# Patient Record
Sex: Female | Born: 1945
Health system: Southern US, Community
[De-identification: ages and names within clinical notes are randomized; demographics above are authoritative.]

## PROBLEM LIST (undated history)

## (undated) DIAGNOSIS — R7303 Prediabetes: Secondary | ICD-10-CM

## (undated) DIAGNOSIS — R7302 Impaired glucose tolerance (oral): Secondary | ICD-10-CM

## (undated) DIAGNOSIS — K5730 Diverticulosis of large intestine without perforation or abscess without bleeding: Secondary | ICD-10-CM

## (undated) DIAGNOSIS — K635 Polyp of colon: Secondary | ICD-10-CM

## (undated) DIAGNOSIS — K648 Other hemorrhoids: Secondary | ICD-10-CM

## (undated) DIAGNOSIS — J449 Chronic obstructive pulmonary disease, unspecified: Secondary | ICD-10-CM

## (undated) DIAGNOSIS — I10 Essential (primary) hypertension: Secondary | ICD-10-CM

## (undated) DIAGNOSIS — H269 Unspecified cataract: Secondary | ICD-10-CM

## (undated) DIAGNOSIS — J189 Pneumonia, unspecified organism: Secondary | ICD-10-CM

## (undated) HISTORY — DX: Essential (primary) hypertension: I10

## (undated) HISTORY — DX: Polyp of colon: K63.5

## (undated) HISTORY — DX: Other hemorrhoids: K64.8

## (undated) HISTORY — PX: CATARACT EXTRACTION W/ INTRAOCULAR LENS  IMPLANT, BILATERAL: SHX1307

## (undated) HISTORY — DX: Chronic obstructive pulmonary disease, unspecified: J44.9

## (undated) HISTORY — DX: Impaired glucose tolerance (oral): R73.02

## (undated) HISTORY — DX: Diverticulosis of large intestine without perforation or abscess without bleeding: K57.30

## (undated) HISTORY — DX: Unspecified cataract: H26.9

## (undated) HISTORY — PX: TUBAL LIGATION: SHX77

## (undated) HISTORY — PX: BREAST BIOPSY: SHX20

---

## 1998-01-15 ENCOUNTER — Other Ambulatory Visit: Admission: RE | Admit: 1998-01-15 | Discharge: 1998-01-15 | Payer: Self-pay | Admitting: Obstetrics and Gynecology

## 1998-01-24 ENCOUNTER — Ambulatory Visit (HOSPITAL_COMMUNITY): Admission: RE | Admit: 1998-01-24 | Discharge: 1998-01-24 | Payer: Self-pay | Admitting: Obstetrics and Gynecology

## 2000-01-14 ENCOUNTER — Ambulatory Visit (HOSPITAL_COMMUNITY): Admission: RE | Admit: 2000-01-14 | Discharge: 2000-01-14 | Payer: Self-pay | Admitting: Obstetrics and Gynecology

## 2000-01-14 ENCOUNTER — Encounter: Payer: Self-pay | Admitting: Obstetrics and Gynecology

## 2004-08-25 HISTORY — PX: SPINE SURGERY: SHX786

## 2005-08-05 ENCOUNTER — Emergency Department (HOSPITAL_COMMUNITY): Admission: EM | Admit: 2005-08-05 | Discharge: 2005-08-05 | Payer: Self-pay | Admitting: Emergency Medicine

## 2007-01-28 ENCOUNTER — Ambulatory Visit (HOSPITAL_COMMUNITY): Admission: RE | Admit: 2007-01-28 | Discharge: 2007-01-28 | Payer: Self-pay | Admitting: Emergency Medicine

## 2007-02-09 DIAGNOSIS — K5730 Diverticulosis of large intestine without perforation or abscess without bleeding: Secondary | ICD-10-CM

## 2007-02-09 DIAGNOSIS — K635 Polyp of colon: Secondary | ICD-10-CM

## 2007-02-09 DIAGNOSIS — K648 Other hemorrhoids: Secondary | ICD-10-CM

## 2007-02-09 HISTORY — DX: Other hemorrhoids: K64.8

## 2007-02-09 HISTORY — DX: Polyp of colon: K63.5

## 2007-02-09 HISTORY — DX: Diverticulosis of large intestine without perforation or abscess without bleeding: K57.30

## 2007-06-22 ENCOUNTER — Ambulatory Visit (HOSPITAL_COMMUNITY): Admission: RE | Admit: 2007-06-22 | Discharge: 2007-06-22 | Payer: Self-pay | Admitting: Neurology

## 2008-02-01 ENCOUNTER — Ambulatory Visit (HOSPITAL_COMMUNITY): Admission: RE | Admit: 2008-02-01 | Discharge: 2008-02-01 | Payer: Self-pay | Admitting: Emergency Medicine

## 2011-10-06 ENCOUNTER — Ambulatory Visit: Payer: Self-pay | Admitting: Physician Assistant

## 2011-10-06 VITALS — BP 133/86 | HR 108 | Temp 99.3°F | Resp 16 | Ht 65.0 in | Wt 193.0 lb

## 2011-10-06 DIAGNOSIS — I10 Essential (primary) hypertension: Secondary | ICD-10-CM

## 2011-10-06 MED ORDER — HYDROCHLOROTHIAZIDE 25 MG PO TABS: 25.0000 mg | ORAL_TABLET | Freq: Every day | ORAL | Status: DC

## 2011-10-06 NOTE — Progress Notes (Signed)
  Subjective:    Patient ID: Nicole Spence, female    DOB: Nov 01, 1945, 66 y.o.   MRN: [id number]  HPI Nicole Spence is here for refill on HCTZ.  She has been out of her meds for 6-7 months and comes in today because she had a HA on Sunday.  HA is now gone but has pt concerned.  Has been healthy otherwise without any new medical problems.  She is not working and stays busy making jewelry at home.  Nicole Spence continues to smoke electric cigarettes.   Review of Systems  Eyes: Negative for visual disturbance.  Respiratory: Negative for cough and shortness of breath.   Cardiovascular: Positive for leg swelling. Negative for chest pain and palpitations.  Neurological: Positive for headaches. Negative for dizziness.       Objective:   Physical Exam  Constitutional: She appears well-developed and well-nourished.  Eyes: Conjunctivae, EOM and lids are normal.  Cardiovascular: Regular rhythm and normal pulses.  Tachycardia present.   Pulmonary/Chest: Effort normal and breath sounds normal.  Skin: Skin is warm. She is not diaphoretic.   EKG read with Dr. Shearon Stalls pt depression       Assessment & Plan:  HTN  CMET HCTZ 25 1 a day #90 1 rf Check readings at home. Would like for Ms Spence to return in 6 months but realize this is a financial hardship for her.  Asked that she call in 4-6 months with her status and determine any F/U at that time.

## 2011-10-07 LAB — COMPREHENSIVE METABOLIC PANEL
AST: 17 U/L (ref 0–37)
Albumin: 4.4 g/dL (ref 3.5–5.2)
BUN: 10 mg/dL (ref 6–23)
CO2: 27 mEq/L (ref 19–32)
Calcium: 9.4 mg/dL (ref 8.4–10.5)
Chloride: 103 mEq/L (ref 96–112)
Creat: 0.97 mg/dL (ref 0.50–1.10)
Glucose, Bld: 111 mg/dL — ABNORMAL HIGH (ref 70–99)
Potassium: 3.7 mEq/L (ref 3.5–5.3)

## 2011-10-07 LAB — CBC WITH DIFFERENTIAL/PLATELET
Basophils Relative: 0 % (ref 0–1)
Hemoglobin: 15.7 g/dL — ABNORMAL HIGH (ref 12.0–15.0)
Lymphocytes Relative: 32 % (ref 12–46)
MCHC: 33.5 g/dL (ref 30.0–36.0)
Monocytes Relative: 5 % (ref 3–12)
Neutro Abs: 8.9 10*3/uL — ABNORMAL HIGH (ref 1.7–7.7)
Neutrophils Relative %: 62 % (ref 43–77)
RBC: 5.26 MIL/uL — ABNORMAL HIGH (ref 3.87–5.11)
WBC: 14.5 10*3/uL — ABNORMAL HIGH (ref 4.0–10.5)

## 2012-04-01 ENCOUNTER — Other Ambulatory Visit: Payer: Self-pay | Admitting: Physician Assistant

## 2012-08-25 HISTORY — PX: BREAST BIOPSY: SHX20

## 2013-02-09 ENCOUNTER — Ambulatory Visit (INDEPENDENT_AMBULATORY_CARE_PROVIDER_SITE_OTHER): Payer: BC Managed Care – PPO | Admitting: Family Medicine

## 2013-02-09 VITALS — BP 132/76 | HR 91 | Temp 98.1°F | Resp 18 | Ht 65.0 in | Wt 196.8 lb

## 2013-02-09 DIAGNOSIS — Z1239 Encounter for other screening for malignant neoplasm of breast: Secondary | ICD-10-CM

## 2013-02-09 DIAGNOSIS — I10 Essential (primary) hypertension: Secondary | ICD-10-CM

## 2013-02-09 LAB — COMPREHENSIVE METABOLIC PANEL
BUN: 7 mg/dL (ref 6–23)
CO2: 26 mEq/L (ref 19–32)
Calcium: 9.8 mg/dL (ref 8.4–10.5)
Chloride: 106 mEq/L (ref 96–112)
Creat: 0.78 mg/dL (ref 0.50–1.10)

## 2013-02-09 LAB — POCT CBC
HCT, POC: 45 % (ref 37.7–47.9)
Hemoglobin: 14.3 g/dL (ref 12.2–16.2)
Lymph, poc: 2.9 (ref 0.6–3.4)
MCH, POC: 29.9 pg (ref 27–31.2)
MCHC: 31.8 g/dL (ref 31.8–35.4)
MPV: 8.7 fL (ref 0–99.8)
POC LYMPH PERCENT: 33.4 %L (ref 10–50)
POC MID %: 7.5 %M (ref 0–12)
WBC: 8.8 10*3/uL (ref 4.6–10.2)

## 2013-02-09 LAB — POCT URINALYSIS DIPSTICK
Bilirubin, UA: NEGATIVE
Blood, UA: NEGATIVE
Ketones, UA: NEGATIVE
pH, UA: 5.5

## 2013-02-09 LAB — TSH: TSH: 1.062 u[IU]/mL (ref 0.350–4.500)

## 2013-02-09 LAB — LIPID PANEL
HDL: 46 mg/dL (ref >39)
Triglycerides: 134 mg/dL (ref <150)

## 2013-02-09 MED ORDER — HYDROCHLOROTHIAZIDE 12.5 MG PO TABS: 12.5000 mg | ORAL_TABLET | Freq: Every day | ORAL | Status: DC

## 2013-02-09 NOTE — Progress Notes (Signed)
[address]   [place], [place]  [address]   [phone]  Subjective:    Patient ID: Nicole Spence, female    DOB: 07-28-1946, 67 y.o.   MRN: [id number]  HPI This 67 y.o. female presents for fifteen month follow-up for hypertension.  Last office visit 09/2011.  Taking medication qod.  Has not been taking medication in past week.  Denies HA, dizziness, focal weakness; denies chest pain, SOB, cough; +intermittent leg swelling.    2. Health Maintenance:  Last mammogram five years ago; due for colonoscopy.    Review of Systems  Constitutional: Negative for fever, chills, diaphoresis and fatigue.  Respiratory: Negative for cough and shortness of breath.   Cardiovascular: Positive for leg swelling. Negative for chest pain and palpitations.  Gastrointestinal: Negative for nausea, vomiting, abdominal pain and diarrhea.  Neurological: Negative for dizziness, tremors, seizures, syncope, facial asymmetry, speech difficulty, weakness, light-headedness, numbness and headaches.    Past Medical History  Diagnosis Date  . Hypertension     Past Surgical History  Procedure Laterality Date  . Tubal ligation      Prior to Admission medications   Medication Sig Start Date End Date Taking? Authorizing Provider  fish oil-omega-3 fatty acids 1000 MG capsule Take 2 g by mouth daily.   Yes Historical Provider, MD  hydrochlorothiazide (HYDRODIURIL) 25 MG tablet Take 1 tablet (25 mg total) by mouth daily. NEEDS OFFICE VISIT/LABS FOR MORE 04/01/12  Yes Pattricia Boss, PA-C    No Known Allergies  History   Social History  . Marital Status: Legally Separated    Spouse Name: N/A    Number of Children: 2  . Years of Education: N/A   Occupational History  . retired    Social History Main Topics  . Smoking status: Current Every Day Smoker  . Smokeless tobacco: Not on file  . Alcohol Use: Yes  . Drug Use: No  . Sexually Active: Not on file   Other Topics Concern  . Not on file   Social  History Narrative   Marital status: divorced.      Children: 2 children; 5 grandchildren      Employment:  Retired in 2008       Tobacco: electronic cigarette since 2011       Alcohol: weekends; wine coolers       Exercise: none    Family History  Problem Relation Age of Onset  . Diabetes Mother   . Stroke Father   . Heart disease Father        Objective:   Physical Exam  Nursing note and vitals reviewed. Constitutional: She is oriented to person, place, and time. She appears well-developed and well-nourished. No distress.  HENT:  Head: Normocephalic and atraumatic.  Right Ear: External ear normal.  Left Ear: External ear normal.  Nose: Nose normal.  Mouth/Throat: Oropharynx is clear and moist.  Eyes: Conjunctivae and EOM are normal. Pupils are equal, round, and reactive to light.  Neck: Normal range of motion. Neck supple. No thyromegaly present.  Cardiovascular: Normal rate, regular rhythm, normal heart sounds and intact distal pulses.  Exam reveals no gallop and no friction rub.   No murmur heard. Pulmonary/Chest: Effort normal and breath sounds normal. She has no wheezes.  Abdominal: Soft. Bowel sounds are normal. There is no tenderness. There is no rebound and no guarding.  Lymphadenopathy:    She has no cervical adenopathy.  Neurological: She is alert and oriented to person, place, and  time. No cranial nerve deficit. She exhibits normal muscle tone. Coordination normal.  Skin: Skin is warm and dry. She is not diaphoretic.  Psychiatric: She has a normal mood and affect. Her behavior is normal.    Results for orders placed in visit on 02/09/13  POCT CBC      Result Value Range   WBC 8.8  4.6 - 10.2 K/uL   Lymph, poc 2.9  0.6 - 3.4   POC LYMPH PERCENT 33.4  10 - 50 %L   MID (cbc) 0.7  0 - 0.9   POC MID % 7.5  0 - 12 %M   POC Granulocyte 5.2  2 - 6.9   Granulocyte percent 59.1  37 - 80 %G   RBC 4.78  4.04 - 5.48 M/uL   Hemoglobin 14.3  12.2 - 16.2 g/dL   HCT, POC  16.1  09.6 - 47.9 %   MCV 94.2  80 - 97 fL   MCH, POC 29.9  27 - 31.2 pg   MCHC 31.8  31.8 - 35.4 g/dL   RDW, POC 04.5     Platelet Count, POC 323  142 - 424 K/uL   MPV 8.7  0 - 99.8 fL  POCT URINALYSIS DIPSTICK      Result Value Range   Color, UA yellow     Clarity, UA clear     Glucose, UA neg     Bilirubin, UA neg     Ketones, UA neg     Spec Grav, UA <=1.005     Blood, UA neg     pH, UA 5.5     Protein, UA neg     Urobilinogen, UA 0.2     Nitrite, UA neg     Leukocytes, UA Negative           Assessment & Plan:  Essential hypertension, benign - Plan: POCT CBC, POCT urinalysis dipstick, Comprehensive metabolic panel, TSH, Lipid panel, hydrochlorothiazide (HYDRODIURIL) 12.5 MG tablet  Breast cancer screening - Plan: MM Digital Screening   1.  HTN: controlled; will decrease HCTZ to 12.5mg  daily and encourage daily use.  Obtain labs, urine.  Recommend checking BP daily for two weeks and then once weekly; advise follow-up in 6-7 months. 2. Breast cancer screening: refer for mammogram; follow-up for CPE in seven months.  To also call for repeat colonoscopy.  Meds ordered this encounter  Medications  . hydrochlorothiazide (HYDRODIURIL) 12.5 MG tablet    Sig: Take 1 tablet (12.5 mg total) by mouth daily. NEEDS OFFICE VISIT/LABS FOR MORE    Dispense:  90 tablet    Refill:  1

## 2013-02-11 NOTE — Progress Notes (Signed)
Left msg for pt to schedule CPE with Dr. Katrinka Blazing in January.

## 2013-02-15 NOTE — Progress Notes (Signed)
Sent pt reminder letter to schedule future CPE with Dr. Katrinka Blazing.

## 2013-02-24 ENCOUNTER — Ambulatory Visit (HOSPITAL_COMMUNITY)
Admission: RE | Admit: 2013-02-24 | Discharge: 2013-02-24 | Disposition: A | Discharge status: Home / Self Care | Payer: BC Managed Care – PPO | Source: Ambulatory Visit | Attending: Family Medicine | Admitting: Family Medicine

## 2013-02-24 DIAGNOSIS — Z1239 Encounter for other screening for malignant neoplasm of breast: Secondary | ICD-10-CM

## 2013-02-24 DIAGNOSIS — Z1231 Encounter for screening mammogram for malignant neoplasm of breast: Secondary | ICD-10-CM | POA: Insufficient documentation

## 2013-03-02 ENCOUNTER — Other Ambulatory Visit: Payer: Self-pay | Admitting: Family Medicine

## 2013-03-02 DIAGNOSIS — R928 Other abnormal and inconclusive findings on diagnostic imaging of breast: Secondary | ICD-10-CM

## 2013-03-11 ENCOUNTER — Ambulatory Visit
Admission: RE | Admit: 2013-03-11 | Discharge: 2013-03-11 | Disposition: A | Discharge status: Home / Self Care | Payer: BC Managed Care – PPO | Source: Ambulatory Visit | Attending: Family Medicine | Admitting: Family Medicine

## 2013-03-11 ENCOUNTER — Other Ambulatory Visit: Payer: Self-pay | Admitting: Family Medicine

## 2013-03-11 DIAGNOSIS — R928 Other abnormal and inconclusive findings on diagnostic imaging of breast: Secondary | ICD-10-CM

## 2013-03-29 ENCOUNTER — Ambulatory Visit
Admission: RE | Admit: 2013-03-29 | Discharge: 2013-03-29 | Disposition: A | Discharge status: Home / Self Care | Payer: BC Managed Care – PPO | Source: Ambulatory Visit | Attending: Family Medicine | Admitting: Family Medicine

## 2013-03-29 DIAGNOSIS — R928 Other abnormal and inconclusive findings on diagnostic imaging of breast: Secondary | ICD-10-CM

## 2013-08-07 ENCOUNTER — Other Ambulatory Visit: Payer: Self-pay | Admitting: Family Medicine

## 2014-01-11 ENCOUNTER — Telehealth: Payer: Self-pay

## 2014-01-11 NOTE — Telephone Encounter (Signed)
[place] WITH YOU ON June 3.  THE PT WILL RUN OUT OF BP MEDICINE BY THEN.  CAN WE PLEASE REFILL THIS?  SHE NOW USES [facility] ON [address].  [phone]

## 2014-01-12 MED ORDER — HYDROCHLOROTHIAZIDE 12.5 MG PO TABS: 12.5000 mg | ORAL_TABLET | Freq: Every day | ORAL | Status: DC

## 2014-01-12 NOTE — Telephone Encounter (Signed)
Sent in 1 mos RF and notified pt.

## 2014-01-25 ENCOUNTER — Encounter: Payer: Self-pay | Admitting: Family Medicine

## 2014-01-25 ENCOUNTER — Ambulatory Visit (INDEPENDENT_AMBULATORY_CARE_PROVIDER_SITE_OTHER): Payer: Medicare Other | Admitting: Family Medicine

## 2014-01-25 VITALS — BP 150/78 | HR 90 | Temp 99.0°F | Resp 16 | Ht 65.5 in | Wt 195.4 lb

## 2014-01-25 DIAGNOSIS — Z0000 Encounter for general adult medical examination without abnormal findings: Secondary | ICD-10-CM

## 2014-01-25 DIAGNOSIS — I10 Essential (primary) hypertension: Secondary | ICD-10-CM | POA: Diagnosis not present

## 2014-01-25 DIAGNOSIS — Z86010 Personal history of colonic polyps: Secondary | ICD-10-CM | POA: Diagnosis not present

## 2014-01-25 DIAGNOSIS — Z23 Encounter for immunization: Secondary | ICD-10-CM | POA: Diagnosis not present

## 2014-01-25 DIAGNOSIS — Z01419 Encounter for gynecological examination (general) (routine) without abnormal findings: Secondary | ICD-10-CM | POA: Diagnosis not present

## 2014-01-25 DIAGNOSIS — R7309 Other abnormal glucose: Secondary | ICD-10-CM | POA: Diagnosis not present

## 2014-01-25 DIAGNOSIS — Z1239 Encounter for other screening for malignant neoplasm of breast: Secondary | ICD-10-CM

## 2014-01-25 DIAGNOSIS — Z131 Encounter for screening for diabetes mellitus: Secondary | ICD-10-CM

## 2014-01-25 DIAGNOSIS — Z1211 Encounter for screening for malignant neoplasm of colon: Secondary | ICD-10-CM | POA: Diagnosis not present

## 2014-01-25 DIAGNOSIS — E669 Obesity, unspecified: Secondary | ICD-10-CM

## 2014-01-25 DIAGNOSIS — Z13820 Encounter for screening for osteoporosis: Secondary | ICD-10-CM

## 2014-01-25 DIAGNOSIS — Z124 Encounter for screening for malignant neoplasm of cervix: Secondary | ICD-10-CM

## 2014-01-25 LAB — CBC WITH DIFFERENTIAL/PLATELET
BASOS PCT: 0 % (ref 0–1)
Basophils Absolute: 0 10*3/uL (ref 0.0–0.1)
EOS ABS: 0.2 10*3/uL (ref 0.0–0.7)
Eosinophils Relative: 2 % (ref 0–5)
HCT: 43.9 % (ref 36.0–46.0)
HEMOGLOBIN: 15.1 g/dL — AB (ref 12.0–15.0)
LYMPHS ABS: 2.8 10*3/uL (ref 0.7–4.0)
Lymphocytes Relative: 37 % (ref 12–46)
MCH: 29.7 pg (ref 26.0–34.0)
MCHC: 34.4 g/dL (ref 30.0–36.0)
MCV: 86.2 fL (ref 78.0–100.0)
MONOS PCT: 5 % (ref 3–12)
Monocytes Absolute: 0.4 10*3/uL (ref 0.1–1.0)
NEUTROS ABS: 4.3 10*3/uL (ref 1.7–7.7)
NEUTROS PCT: 56 % (ref 43–77)
PLATELETS: 327 10*3/uL (ref 150–400)
RBC: 5.09 MIL/uL (ref 3.87–5.11)
RDW: 14 % (ref 11.5–15.5)
WBC: 7.6 10*3/uL (ref 4.0–10.5)

## 2014-01-25 LAB — POCT URINALYSIS DIPSTICK
Bilirubin, UA: NEGATIVE
Glucose, UA: NEGATIVE
Ketones, UA: NEGATIVE
NITRITE UA: NEGATIVE
PROTEIN UA: NEGATIVE
Spec Grav, UA: 1.02
UROBILINOGEN UA: 0.2
pH, UA: 5.5

## 2014-01-25 LAB — POCT UA - MICROSCOPIC ONLY
CASTS, UR, LPF, POC: NEGATIVE
CRYSTALS, UR, HPF, POC: NEGATIVE
Mucus, UA: POSITIVE
YEAST UA: NEGATIVE

## 2014-01-25 MED ORDER — HYDROCHLOROTHIAZIDE 25 MG PO TABS: 25.0000 mg | ORAL_TABLET | Freq: Every day | ORAL | Status: DC

## 2014-01-25 MED ORDER — ZOSTER VACCINE LIVE 19400 UNT/0.65ML ~~LOC~~ SOLR
0.6500 mL | Freq: Once | SUBCUTANEOUS | Status: DC
Start: 2014-01-25 — End: 2014-07-26

## 2014-01-25 MED ORDER — PNEUMOCOCCAL 13-VAL CONJ VACC IM SUSP: 0.5000 mL | Freq: Once | INTRAMUSCULAR | Status: DC
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
## 2014-01-25 NOTE — Patient Instructions (Addendum)
1. Start Aspirin 81mg  one tablet daily. 2.  Recommend 3 servings of dairy daily. 3.  Recommend quitting e-cigarette. 4.  Recommend exercise for 30 minutes five days per week.

## 2014-01-26 ENCOUNTER — Telehealth: Payer: Self-pay

## 2014-01-26 LAB — PAP IG W/ RFLX HPV ASCU

## 2014-01-26 LAB — COMPLETE METABOLIC PANEL WITH GFR
ALBUMIN: 4.2 g/dL (ref 3.5–5.2)
ALK PHOS: 100 U/L (ref 39–117)
ALT: 13 U/L (ref 0–35)
AST: 18 U/L (ref 0–37)
BILIRUBIN TOTAL: 0.6 mg/dL (ref 0.2–1.2)
BUN: 9 mg/dL (ref 6–23)
CO2: 31 meq/L (ref 19–32)
Calcium: 10.3 mg/dL (ref 8.4–10.5)
Chloride: 101 mEq/L (ref 96–112)
Creat: 0.84 mg/dL (ref 0.50–1.10)
GFR, Est African American: 84 mL/min
GFR, Est Non African American: 73 mL/min
Glucose, Bld: 97 mg/dL (ref 70–99)
POTASSIUM: 4 meq/L (ref 3.5–5.3)
SODIUM: 141 meq/L (ref 135–145)
TOTAL PROTEIN: 7.5 g/dL (ref 6.0–8.3)

## 2014-01-26 LAB — LIPID PANEL
CHOL/HDL RATIO: 3.1 ratio
Cholesterol: 144 mg/dL (ref 0–200)
HDL: 46 mg/dL (ref >39)
LDL CALC: 80 mg/dL (ref 0–99)
Triglycerides: 89 mg/dL (ref <150)
VLDL: 18 mg/dL (ref 0–40)

## 2014-01-26 LAB — HEMOGLOBIN A1C
HEMOGLOBIN A1C: 6.4 % — AB (ref <5.7)
MEAN PLASMA GLUCOSE: 137 mg/dL — AB (ref <117)

## 2014-01-26 NOTE — Telephone Encounter (Signed)
Pt saw Dr. Tamala Julian and was written a prescription for an injection, but it needs pre-authorization for medicare at [phone]: Also the name on the prescription had her wrong middle initil ( it was an S, should be an N) she doesn't know if that will be a problem or not

## 2014-01-30 NOTE — Telephone Encounter (Signed)
Called pt for details and she stated that the pharm told her that ins should cover it ans she can just go in and have it done at certain times. Pt will try this and will have pharm send fax if PA is needed after all.

## 2014-02-06 DIAGNOSIS — Z86010 Personal history of colonic polyps: Secondary | ICD-10-CM | POA: Diagnosis not present

## 2014-02-06 DIAGNOSIS — R143 Flatulence: Secondary | ICD-10-CM | POA: Diagnosis not present

## 2014-02-06 DIAGNOSIS — K219 Gastro-esophageal reflux disease without esophagitis: Secondary | ICD-10-CM | POA: Diagnosis not present

## 2014-02-06 DIAGNOSIS — R141 Gas pain: Secondary | ICD-10-CM | POA: Diagnosis not present

## 2014-02-21 DIAGNOSIS — Z86010 Personal history of colonic polyps: Secondary | ICD-10-CM | POA: Diagnosis not present

## 2014-02-21 DIAGNOSIS — K5730 Diverticulosis of large intestine without perforation or abscess without bleeding: Secondary | ICD-10-CM | POA: Diagnosis not present

## 2014-02-21 DIAGNOSIS — D175 Benign lipomatous neoplasm of intra-abdominal organs: Secondary | ICD-10-CM | POA: Diagnosis not present

## 2014-02-21 LAB — HM COLONOSCOPY

## 2014-04-22 ENCOUNTER — Ambulatory Visit (INDEPENDENT_AMBULATORY_CARE_PROVIDER_SITE_OTHER): Payer: Medicare Other | Admitting: Family Medicine

## 2014-04-22 VITALS — BP 130/74 | HR 75 | Temp 98.2°F | Resp 18 | Ht 65.5 in | Wt 195.2 lb

## 2014-04-22 DIAGNOSIS — S60229A Contusion of unspecified hand, initial encounter: Secondary | ICD-10-CM | POA: Diagnosis not present

## 2014-04-22 DIAGNOSIS — M79642 Pain in left hand: Secondary | ICD-10-CM

## 2014-04-22 DIAGNOSIS — S60222A Contusion of left hand, initial encounter: Secondary | ICD-10-CM

## 2014-04-22 DIAGNOSIS — M79609 Pain in unspecified limb: Secondary | ICD-10-CM | POA: Diagnosis not present

## 2014-04-22 NOTE — Patient Instructions (Signed)
RICE: Routine Care for Injuries The routine care of many injuries includes Rest, Ice, Compression, and Elevation (RICE). HOME CARE INSTRUCTIONS  Rest is needed to allow your body to heal. Routine activities can usually be resumed when comfortable. Injured tendons and bones can take up to 6 weeks to heal. Tendons are the cord-like structures that attach muscle to bone.  Ice following an injury helps keep the swelling down and reduces pain.  Put ice in a plastic bag.  Place a towel between your skin and the bag.  Leave the ice on for 15-20 minutes, 3-4 times a day, or as directed by your health care provider. Do this while awake, for the first 24 to 48 hours. After that, continue as directed by your caregiver.  Compression helps keep swelling down. It also gives support and helps with discomfort. If an elastic bandage has been applied, it should be removed and reapplied every 3 to 4 hours. It should not be applied tightly, but firmly enough to keep swelling down. Watch fingers or toes for swelling, bluish discoloration, coldness, numbness, or excessive pain. If any of these problems occur, remove the bandage and reapply loosely. Contact your caregiver if these problems continue.  Elevation helps reduce swelling and decreases pain. With extremities, such as the arms, hands, legs, and feet, the injured area should be placed near or above the level of the heart, if possible. SEEK IMMEDIATE MEDICAL CARE IF:  You have persistent pain and swelling.  You develop redness, numbness, or unexpected weakness.  Your symptoms are getting worse rather than improving after several days. These symptoms may indicate that further evaluation or further X-rays are needed. Sometimes, X-rays may not show a small broken bone (fracture) until 1 week or 10 days later. Make a follow-up appointment with your caregiver. Ask when your X-ray results will be ready. Make sure you get your X-ray results. Document Released:  11/23/2000 Document Revised: 08/16/2013 Document Reviewed: 01/10/2011 ExitCare Patient Information 2015 [employer]. This information is not intended to replace advice given to you by your health care provider. Make sure you discuss any questions you have with your health care provider.  
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
## 2014-05-04 ENCOUNTER — Ambulatory Visit (INDEPENDENT_AMBULATORY_CARE_PROVIDER_SITE_OTHER): Payer: Medicare Other | Admitting: Family Medicine

## 2014-05-04 ENCOUNTER — Ambulatory Visit (INDEPENDENT_AMBULATORY_CARE_PROVIDER_SITE_OTHER): Payer: Medicare Other

## 2014-05-04 VITALS — BP 130/78 | HR 86 | Temp 98.9°F | Resp 18 | Ht 65.0 in | Wt 194.0 lb

## 2014-05-04 DIAGNOSIS — S2341XA Sprain of ribs, initial encounter: Secondary | ICD-10-CM | POA: Diagnosis not present

## 2014-05-04 DIAGNOSIS — Z23 Encounter for immunization: Secondary | ICD-10-CM

## 2014-05-04 DIAGNOSIS — R0781 Pleurodynia: Secondary | ICD-10-CM

## 2014-05-04 DIAGNOSIS — R079 Chest pain, unspecified: Secondary | ICD-10-CM | POA: Diagnosis not present

## 2014-05-04 DIAGNOSIS — R0902 Hypoxemia: Secondary | ICD-10-CM | POA: Diagnosis not present

## 2014-05-04 DIAGNOSIS — S29019A Strain of muscle and tendon of unspecified wall of thorax, initial encounter: Secondary | ICD-10-CM

## 2014-05-04 DIAGNOSIS — R739 Hyperglycemia, unspecified: Secondary | ICD-10-CM

## 2014-05-04 DIAGNOSIS — R7309 Other abnormal glucose: Secondary | ICD-10-CM

## 2014-05-04 DIAGNOSIS — R0602 Shortness of breath: Secondary | ICD-10-CM

## 2014-05-04 DIAGNOSIS — J449 Chronic obstructive pulmonary disease, unspecified: Secondary | ICD-10-CM

## 2014-05-04 LAB — POCT CBC
GRANULOCYTE PERCENT: 59.4 % (ref 37–80)
HEMATOCRIT: 45.2 % (ref 37.7–47.9)
Hemoglobin: 14.7 g/dL (ref 12.2–16.2)
LYMPH, POC: 4.1 — AB (ref 0.6–3.4)
MCH, POC: 30.2 pg (ref 27–31.2)
MCHC: 32.6 g/dL (ref 31.8–35.4)
MCV: 92.8 fL (ref 80–97)
MID (CBC): 0.2 (ref 0–0.9)
MPV: 7 fL (ref 0–99.8)
PLATELET COUNT, POC: 315 10*3/uL (ref 142–424)
POC Granulocyte: 6.3 (ref 2–6.9)
POC LYMPH %: 38.7 % (ref 10–50)
POC MID %: 1.9 %M (ref 0–12)
RBC: 4.87 M/uL (ref 4.04–5.48)
RDW, POC: 14.7 %
WBC: 10.6 10*3/uL — AB (ref 4.6–10.2)

## 2014-05-04 LAB — GLUCOSE, POCT (MANUAL RESULT ENTRY): POC Glucose: 93 mg/dl (ref 70–99)

## 2014-05-04 MED ORDER — METHOCARBAMOL 500 MG PO TABS: 500.0000 mg | ORAL_TABLET | Freq: Every evening | ORAL | Status: DC | PRN

## 2014-05-04 MED ORDER — MELOXICAM 15 MG PO TABS: 15.0000 mg | ORAL_TABLET | Freq: Every day | ORAL | Status: DC

## 2014-05-04 MED ORDER — TIOTROPIUM BROMIDE MONOHYDRATE 18 MCG IN CAPS: 18.0000 ug | ORAL_CAPSULE | Freq: Every day | RESPIRATORY_TRACT | Status: DC
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
## 2014-05-04 NOTE — Patient Instructions (Signed)
Influenza Vaccine (Flu Vaccine, Inactivated or Recombinant) 2014-2015: What You Need to Know 1. Why get vaccinated? Influenza ("flu") is a contagious disease that spreads around the [place] every winter, usually between October and May. Flu is caused by influenza viruses, and is spread mainly by coughing, sneezing, and close contact. Anyone can get flu, but the risk of getting flu is highest among children. Symptoms come on suddenly and may last several days. They can include:  fever/chills  sore throat  muscle aches  fatigue  cough  headache  runny or stuffy nose Flu can make some people much sicker than others. These people include young children, people 65 and older, pregnant women, and people with certain health conditions-such as heart, lung or kidney disease, nervous system disorders, or a weakened immune system. Flu vaccination is especially important for these people, and anyone in close contact with them. Flu can also lead to pneumonia, and make existing medical conditions worse. It can cause diarrhea and seizures in children. Each year thousands of people in the [place] die from flu, and many more are hospitalized. Flu vaccine is the best protection against flu and its complications. Flu vaccine also helps prevent spreading flu from person to person. 2. Inactivated and recombinant flu vaccines You are getting an injectable flu vaccine, which is either an "inactivated" or "recombinant" vaccine. These vaccines do not contain any live influenza virus. They are given by injection with a needle, and often called the "flu shot."  A different live, attenuated (weakened) influenza vaccine is sprayed into the nostrils. This vaccine is described in a separate Vaccine Information Statement. Flu vaccination is recommended every year. Some children 6 months through 8 years of age might need two doses during one year. Flu viruses are always changing. Each year's flu vaccine is made  to protect against 3 or 4 viruses that are likely to cause disease that year. Flu vaccine cannot prevent all cases of flu, but it is the best defense against the disease.  It takes about 2 weeks for protection to develop after the vaccination, and protection lasts several months to a year. Some illnesses that are not caused by influenza virus are often mistaken for flu. Flu vaccine will not prevent these illnesses. It can only prevent influenza. Some inactivated flu vaccine contains a very small amount of a mercury-based preservative called thimerosal. Studies have shown that thimerosal in vaccines is not harmful, but flu vaccines that do not contain a preservative are available. 3. Some people should not get this vaccine Tell the person who gives you the vaccine:  If you have any severe, life-threatening allergies. If you ever had a life-threatening allergic reaction after a dose of flu vaccine, or have a severe allergy to any part of this vaccine, including (for example) an allergy to gelatin, antibiotics, or eggs, you may be advised not to get vaccinated. Most, but not all, types of flu vaccine contain a small amount of egg protein.  If you ever had Guillain-Barr Syndrome (a severe paralyzing illness, also called GBS). Some people with a history of GBS should not get this vaccine. This should be discussed with your doctor.  If you are not feeling well. It is usually okay to get flu vaccine when you have a mild illness, but you might be advised to wait until you feel better. You should come back when you are better. 4. Risks of a vaccine reaction With a vaccine, like any medicine, there is a chance of side   effects. These are usually mild and go away on their own. Problems that could happen after any vaccine:  Brief fainting spells can happen after any medical procedure, including vaccination. Sitting or lying down for about 15 minutes can help prevent fainting, and injuries caused by a fall. Tell  your doctor if you feel dizzy, or have vision changes or ringing in the ears.  Severe shoulder pain and reduced range of motion in the arm where a shot was given can happen, very rarely, after a vaccination.  Severe allergic reactions from a vaccine are very rare, estimated at less than 1 in a million doses. If one were to occur, it would usually be within a few minutes to a few hours after the vaccination. Mild problems following inactivated flu vaccine:  soreness, redness, or swelling where the shot was given  hoarseness  sore, red or itchy eyes  cough  fever  aches  headache  itching  fatigue If these problems occur, they usually begin soon after the shot and last 1 or 2 days. Moderate problems following inactivated flu vaccine:  Young children who get inactivated flu vaccine and pneumococcal vaccine (PCV13) at the same time may be at increased risk for seizures caused by fever. Ask your doctor for more information. Tell your doctor if a child who is getting flu vaccine has ever had a seizure. Inactivated flu vaccine does not contain live flu virus, so you cannot get the flu from this vaccine. As with any medicine, there is a very remote chance of a vaccine causing a serious injury or death. The safety of vaccines is always being monitored. For more information, visit: www.cdc.gov/vaccinesafety/ 5. What if there is a serious reaction? What should I look for?  Look for anything that concerns you, such as signs of a severe allergic reaction, very high fever, or behavior changes. Signs of a severe allergic reaction can include hives, swelling of the face and throat, difficulty breathing, a fast heartbeat, dizziness, and weakness. These would start a few minutes to a few hours after the vaccination. What should I do?  If you think it is a severe allergic reaction or other emergency that can't wait, call 9-1-1 and get the person to the nearest hospital. Otherwise, call your  doctor.  Afterward, the reaction should be reported to the Vaccine Adverse Event Reporting System (VAERS). Your doctor should file this report, or you can do it yourself through the VAERS web site at www.vaers.hhs.gov, or by calling 1-800-822-7967. VAERS does not give medical advice. 6. The National Vaccine Injury Compensation Program The National Vaccine Injury Compensation Program (VICP) is a federal program that was created to compensate people who may have been injured by certain vaccines. Persons who believe they may have been injured by a vaccine can learn about the program and about filing a claim by calling 1-800-338-2382 or visiting the VICP website at www.hrsa.gov/vaccinecompensation. There is a time limit to file a claim for compensation. 7. How can I learn more?  Ask your health care provider.  Call your local or state health department.  Contact the Centers for Disease Control and Prevention (CDC):  Call 1-800-232-4636 (1-800-CDC-INFO) or  Visit CDC's website at www.cdc.gov/flu CDC Vaccine Information Statement (Interim) Inactivated Influenza Vaccine (04/12/2013) Document Released: 06/05/2006 Document Revised: 12/26/2013 Document Reviewed: 07/29/2013 Exit[employer] Patient Information 2015 [employer]. This information is not intended to replace advice given to you by your health care provider. Make sure you discuss any questions you have with your health   care provider.  

## 2014-07-26 ENCOUNTER — Telehealth: Payer: Self-pay | Admitting: Family Medicine

## 2014-07-26 ENCOUNTER — Ambulatory Visit (INDEPENDENT_AMBULATORY_CARE_PROVIDER_SITE_OTHER): Payer: Medicare Other | Admitting: Family Medicine

## 2014-07-26 ENCOUNTER — Encounter: Payer: Self-pay | Admitting: Family Medicine

## 2014-07-26 ENCOUNTER — Ambulatory Visit (INDEPENDENT_AMBULATORY_CARE_PROVIDER_SITE_OTHER): Payer: Medicare Other

## 2014-07-26 VITALS — BP 122/72 | HR 87 | Temp 98.4°F | Resp 16 | Ht 65.0 in | Wt 197.6 lb

## 2014-07-26 DIAGNOSIS — R739 Hyperglycemia, unspecified: Secondary | ICD-10-CM | POA: Diagnosis not present

## 2014-07-26 DIAGNOSIS — R0600 Dyspnea, unspecified: Secondary | ICD-10-CM

## 2014-07-26 DIAGNOSIS — M791 Myalgia, unspecified site: Secondary | ICD-10-CM

## 2014-07-26 DIAGNOSIS — I10 Essential (primary) hypertension: Secondary | ICD-10-CM | POA: Diagnosis not present

## 2014-07-26 DIAGNOSIS — J449 Chronic obstructive pulmonary disease, unspecified: Secondary | ICD-10-CM

## 2014-07-26 DIAGNOSIS — R0781 Pleurodynia: Secondary | ICD-10-CM | POA: Diagnosis not present

## 2014-07-26 DIAGNOSIS — E2839 Other primary ovarian failure: Secondary | ICD-10-CM | POA: Diagnosis not present

## 2014-07-26 DIAGNOSIS — J309 Allergic rhinitis, unspecified: Secondary | ICD-10-CM | POA: Diagnosis not present

## 2014-07-26 DIAGNOSIS — R7309 Other abnormal glucose: Secondary | ICD-10-CM | POA: Diagnosis not present

## 2014-07-26 LAB — COMPLETE METABOLIC PANEL WITH GFR
ALT: 14 U/L (ref 0–35)
AST: 19 U/L (ref 0–37)
Albumin: 4.1 g/dL (ref 3.5–5.2)
Alkaline Phosphatase: 95 U/L (ref 39–117)
BUN: 13 mg/dL (ref 6–23)
CO2: 27 mEq/L (ref 19–32)
Calcium: 10.1 mg/dL (ref 8.4–10.5)
Chloride: 102 mEq/L (ref 96–112)
Creat: 1.04 mg/dL (ref 0.50–1.10)
GFR, Est African American: 65 mL/min
GFR, Est Non African American: 56 mL/min — ABNORMAL LOW
Glucose, Bld: 82 mg/dL (ref 70–99)
Potassium: 3.9 mEq/L (ref 3.5–5.3)
Sodium: 138 mEq/L (ref 135–145)
Total Bilirubin: 0.5 mg/dL (ref 0.2–1.2)
Total Protein: 7.5 g/dL (ref 6.0–8.3)

## 2014-07-26 LAB — HEMOGLOBIN A1C
HEMOGLOBIN A1C: 6.2 % — AB (ref <5.7)
MEAN PLASMA GLUCOSE: 131 mg/dL — AB (ref <117)

## 2014-07-26 LAB — GLUCOSE, POCT (MANUAL RESULT ENTRY): POC Glucose: 91 mg/dl (ref 70–99)

## 2014-07-26 MED ORDER — CETIRIZINE HCL 5 MG PO TABS: 5.0000 mg | ORAL_TABLET | Freq: Every day | ORAL | Status: DC

## 2014-07-26 NOTE — Telephone Encounter (Signed)
Called patient, unfortunately she does not meet qualifications for handicapped parking, Dr Tamala Julian would like her to walk for exercise.

## 2014-07-26 NOTE — Telephone Encounter (Signed)
Had appt today and forgot to ask about getting a handicapped sticker.   [phone]
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
## 2014-07-26 NOTE — Progress Notes (Signed)
Subjective:    Patient ID: Nicole Spence, female    DOB: 30-Jul-1946, 68 y.o.   MRN: [id number]  HPI  This is a 68 year old female with PMH HTN and COPD presenting for 6 month follow up.  HTN: Last visit HCTZ was increased to 25 mg d/t borderline control. Doesn't check BP at home.  COPD: diagnosed 3 months ago d/t abnormal spirometry. She was prescribed spiriva. She notes when she first started the spiriva she was having a severe sore throat and stopped the medication. She restarted daily spiriva 1 month ago. She is still getting a sore throat in the mornings but less severe. She feels the inhaler has not helped her breathing. She gets SOB with walking to her mailbox. She states she does not exercise. She has discontinued use of e-cigarettes. She is now using 4 mg nicotine lozenges - she uses 4/day. She is planning to go down to 2 mg nicotine lozenges today.  Myalgias: She is still having occasional left rib pain. 3 months ago she was prescribed mobic and robaxin. She finished the mobic. Her pain is much improved from then. She has leftover robaxin and she occasionally will take one before bed if she is having pain - takes a robaxin once every 2 weeks. She usually has pain after an active day around the house.  Got zostavax 05/2014 She got her colonoscopy and self-reports it was normal Normal. She is unsure what her follow up is. Mammogram is due She needs a referral for her bone density scan.  Review of Systems Patient Active Problem List   Diagnosis Date Noted  . Essential hypertension 07/26/2014  . Chronic obstructive pulmonary disease (COPD) 07/26/2014   Prior to Admission medications   Medication Sig Start Date End Date Taking? Authorizing Provider  hydrochlorothiazide (HYDRODIURIL) 25 MG tablet Take 1 tablet (25 mg total) by mouth daily. 01/25/14  Yes Wardell Honour, MD  methocarbamol (ROBAXIN) 500 MG tablet Take 1 tablet (500 mg total) by mouth at bedtime as needed for muscle  spasms. 05/04/14  Yes Wardell Honour, MD  Multiple Vitamins-Minerals (WOMENS 50+ [place] VITAMIN/MIN) TABS Take by mouth daily.   Yes Historical Provider, MD  Probiotic Product (PROBIOTIC DAILY PO) Take by mouth.   Yes Historical Provider, MD  tiotropium (SPIRIVA) 18 MCG inhalation capsule Place 1 capsule (18 mcg total) into inhaler and inhale daily. 05/04/14  Yes Wardell Honour, MD          Allergies  Allergen Reactions  . Adhesive [Tape] Other (See Comments)    IRRITATES SKIN    Patient's social and family history were reviewed.     Objective:   Physical Exam  Constitutional: She is oriented to person, place, and time. She appears well-developed and well-nourished. No distress.  HENT:  Head: Normocephalic and atraumatic.  Right Ear: Hearing, external ear and ear canal normal. Tympanic membrane is retracted.  Left Ear: External ear normal. Tympanic membrane is retracted. Decreased hearing is noted.  Mouth/Throat: Uvula is midline, oropharynx is clear and moist and mucous membranes are normal.  Areas of pale mucosa  Eyes: Conjunctivae and lids are normal. Right eye exhibits no discharge. Left eye exhibits no discharge. No scleral icterus.  Cardiovascular: Normal rate, regular rhythm, normal heart sounds, intact distal pulses and normal pulses.   No murmur heard. Pulmonary/Chest: Effort normal and breath sounds normal. No respiratory distress. She has no wheezes. She has no rhonchi. She has no rales.  Abdominal: Soft. Normal  appearance. There is tenderness (over left anterior ribs, see depiction).    Musculoskeletal: Normal range of motion.  Neurological: She is alert and oriented to person, place, and time.  Skin: Skin is warm, dry and intact. No lesion and no rash noted.  Psychiatric: She has a normal mood and affect. Her speech is normal and behavior is normal. Thought content normal.   BP 122/72 mmHg  Pulse 87  Temp(Src) 98.4 F (36.9 C) (Oral)  Resp 16  Ht 5\' 5"  (1.651 m)  Wt  197 lb 9.6 oz (89.631 kg)  BMI 32.88 kg/m2  SpO2 97%   Results for orders placed or performed in visit on 07/26/14  POCT glucose (manual entry)  Result Value Ref Range   POC Glucose 91 70 - 99 mg/dl      Assessment & Plan:  1. Chronic obstructive pulmonary disease, unspecified COPD, unspecified chronic bronchitis type 2. Dyspnea EKG normal. Spirometry normal today. Will send for stress test to rule out cardiac cause of SOB. If normal, SOB may be d/t deconditioning. Pt will continue spiriva daily.  - Pulmonary Function Test - DG Chest 2 View; Future - EKG 12-Lead  3. Hyperglycemia Glucose 91 today, hgb A1C pending. - POCT glucose (manual entry) - Hemoglobin A1c  4. Essential hypertension, benign Stable. Continue HCTZ 25 mg. - COMPLETE METABOLIC PANEL WITH GFR  5. Allergic rhinitis, unspecified allergic rhinitis type - cetirizine (ZYRTEC) 5 MG tablet; Take 1 tablet (5 mg total) by mouth daily.  Dispense: 30 tablet; Refill: 11  6. Myalgia 7. Rib pain on left side Most likely MSK cause. Per above, referred for stress test to rule out cardiac cause.  8. Estrogen deficiency - DG Bone Density; Future   Benjaman Pott. Drenda Freeze, MHS [facility]  07/26/2014

## 2014-08-01 NOTE — Progress Notes (Signed)
Spoke to pt, she is aware of results

## 2014-08-08 ENCOUNTER — Telehealth (HOSPITAL_COMMUNITY): Payer: Self-pay | Admitting: *Deleted

## 2014-08-11 ENCOUNTER — Encounter (HOSPITAL_COMMUNITY): Payer: Self-pay | Admitting: *Deleted

## 2014-08-31 ENCOUNTER — Telehealth (HOSPITAL_COMMUNITY): Payer: Self-pay

## 2014-08-31 NOTE — Telephone Encounter (Signed)
Encounter complete. 

## 2014-09-05 ENCOUNTER — Ambulatory Visit (HOSPITAL_COMMUNITY)
Admission: RE | Admit: 2014-09-05 | Discharge: 2014-09-05 | Disposition: A | Discharge status: Home / Self Care | Payer: Medicare Other | Source: Ambulatory Visit | Attending: Family Medicine | Admitting: Family Medicine

## 2014-09-05 DIAGNOSIS — I10 Essential (primary) hypertension: Secondary | ICD-10-CM | POA: Diagnosis not present

## 2014-09-05 DIAGNOSIS — R42 Dizziness and giddiness: Secondary | ICD-10-CM | POA: Insufficient documentation

## 2014-09-05 DIAGNOSIS — R0609 Other forms of dyspnea: Secondary | ICD-10-CM | POA: Insufficient documentation

## 2014-09-05 DIAGNOSIS — Z8249 Family history of ischemic heart disease and other diseases of the circulatory system: Secondary | ICD-10-CM | POA: Insufficient documentation

## 2014-09-05 DIAGNOSIS — Z87891 Personal history of nicotine dependence: Secondary | ICD-10-CM | POA: Diagnosis not present

## 2014-09-05 DIAGNOSIS — E785 Hyperlipidemia, unspecified: Secondary | ICD-10-CM | POA: Insufficient documentation

## 2014-09-05 DIAGNOSIS — E669 Obesity, unspecified: Secondary | ICD-10-CM | POA: Insufficient documentation

## 2014-09-05 DIAGNOSIS — R0600 Dyspnea, unspecified: Secondary | ICD-10-CM | POA: Diagnosis not present

## 2014-09-05 DIAGNOSIS — J449 Chronic obstructive pulmonary disease, unspecified: Secondary | ICD-10-CM | POA: Diagnosis not present

## 2014-09-05 DIAGNOSIS — R5383 Other fatigue: Secondary | ICD-10-CM | POA: Diagnosis not present

## 2014-09-05 DIAGNOSIS — R0781 Pleurodynia: Secondary | ICD-10-CM | POA: Diagnosis not present

## 2014-09-05 DIAGNOSIS — R079 Chest pain, unspecified: Secondary | ICD-10-CM | POA: Insufficient documentation

## 2014-09-05 MED ORDER — TECHNETIUM TC 99M SESTAMIBI GENERIC - CARDIOLITE
31.1000 | Freq: Once | INTRAVENOUS | Status: AC | PRN
  Administered 2014-09-05: 31.1 via INTRAVENOUS

## 2014-09-05 MED ORDER — TECHNETIUM TC 99M SESTAMIBI GENERIC - CARDIOLITE
10.8000 | Freq: Once | INTRAVENOUS | Status: AC | PRN
  Administered 2014-09-05: 11 via INTRAVENOUS

## 2014-09-05 NOTE — Procedures (Addendum)
[facility] [address] [place] [address] [phone]  Cardiology Nuclear Med Study  Nicole Spence is a 69 y.o. female     MRN : [id number]     DOB: [birth year]  Procedure Date: 09/05/2014  Nuclear Med Background Indication for Stress Test:  Evaluation for Ischemia History:  COPD and No prior cardiac history reported;No prior NUC MPI for comparison. Cardiac Risk Factors: Family History - CAD, History of Smoking, Hypertension, Lipids and Overweight  Symptoms:  Chest Pain, DOE, Fatigue and Light-Headedness   Nuclear Pre-Procedure Caffeine/Decaff Intake:  7:00pm NPO After: 5:00am   IV Site: R Forearm  IV 0.9% NS with Angio Cath:  22g  Chest Size (in):  n/a IV Started by: Rolene Course, RN  Height: 5\' 5"  (1.651 m)  Cup Size: DD  BMI:  Body mass index is 32.78 kg/(m^2). Weight:  197 lb (89.359 kg)   Tech Comments:  n/a    Nuclear Med Study 1 or 2 day study: 1 day  Stress Test Type:  Stress  Order Authorizing Provider:  Reginia Forts, MD   Resting Radionuclide: Technetium 77m Sestamibi  Resting Radionuclide Dose: 10.8 mCi   Stress Radionuclide:  Technetium 78m Sestamibi  Stress Radionuclide Dose: 31.1 mCi           Stress Protocol Rest HR: 90 Stress HR: 155  Rest BP: 142/80 Stress BP:211/110  Exercise Time (min): 4:41 METS: 5.80   Predicted Max HR: 154 bpm % Max HR: 100.65 bpm Rate Pressure Product: 50539  Dose of Adenosine (mg):  n/a Dose of Lexiscan: n/a mg  Dose of Atropine (mg): n/a Dose of Dobutamine: n/a mcg/kg/min (at max HR)  Stress Test Technologist: Mellody Memos, CCT Nuclear Technologist: Imagene Riches, CNMT   Rest Procedure:  Myocardial perfusion imaging was performed at rest 45 minutes following the intravenous administration of Technetium 62m Sestamibi. Stress Procedure:  The patient performed treadmill exercise using a Bruce  Protocol for 4 minutes 41 seconds. The patient stopped due to shortness  of breath and elevated blood pressure. Patient denied any chest pain.  There were no significant ST-T wave changes.  Technetium 35m Sestamibi was injected IV at peak exercise and myocardial perfusion imaging was performed after a brief delay.  Transient Ischemic Dilatation (Normal <1.22): 0.90  QGS EDV:  43 ml QGS ESV:  8 ml LV Ejection Fraction: 81%     Rest ECG: NSR - Normal EKG  Stress ECG: Insignificant upsloping ST segment depression.  QPS Raw Data Images:  Normal; no motion artifact; normal heart/lung ratio. Stress Images:  Normal homogeneous uptake in all areas of the myocardium. Rest Images:  Normal homogeneous uptake in all areas of the myocardium. Subtraction (SDS):  Normal  Impression Exercise Capacity:  Fair exercise capacity. BP Response:  Hypertensive blood pressure response with peak BP 211/110. Clinical Symptoms:  Mild shortness of breath ECG Impression:  Insignificant upsloping ST segment depression. Comparison with Prior Nuclear Study: No previous nuclear study performed  Overall Impression:  Low risk stress nuclear study with normal perfusion and reduced aerobic capacity with an exaggerrated BP response to exercise.  LV Wall Motion:  NL LV Function, EF 81% post stress; NL Wall Motion   KELLY,THOMAS A, MD  09/05/2014 1:44 PM

## 2014-09-14 ENCOUNTER — Telehealth: Payer: Self-pay | Admitting: Family Medicine

## 2014-09-14 NOTE — Telephone Encounter (Signed)
Patient wants to know about her referral to have her bone density test that she never had gotten

## 2014-10-25 ENCOUNTER — Encounter: Payer: Self-pay | Admitting: Family Medicine

## 2014-10-25 ENCOUNTER — Ambulatory Visit (INDEPENDENT_AMBULATORY_CARE_PROVIDER_SITE_OTHER): Payer: Medicare Other | Admitting: Family Medicine

## 2014-10-25 VITALS — BP 120/72 | HR 81 | Temp 98.6°F | Resp 16 | Ht 65.5 in | Wt 199.0 lb

## 2014-10-25 DIAGNOSIS — R5381 Other malaise: Secondary | ICD-10-CM | POA: Diagnosis not present

## 2014-10-25 DIAGNOSIS — R7302 Impaired glucose tolerance (oral): Secondary | ICD-10-CM | POA: Diagnosis not present

## 2014-10-25 DIAGNOSIS — Z780 Asymptomatic menopausal state: Secondary | ICD-10-CM

## 2014-10-25 DIAGNOSIS — J449 Chronic obstructive pulmonary disease, unspecified: Secondary | ICD-10-CM

## 2014-10-25 DIAGNOSIS — E669 Obesity, unspecified: Secondary | ICD-10-CM | POA: Diagnosis not present

## 2014-10-25 DIAGNOSIS — R0602 Shortness of breath: Secondary | ICD-10-CM

## 2014-10-25 DIAGNOSIS — J302 Other seasonal allergic rhinitis: Secondary | ICD-10-CM | POA: Diagnosis not present

## 2014-10-25 DIAGNOSIS — I10 Essential (primary) hypertension: Secondary | ICD-10-CM | POA: Diagnosis not present

## 2014-10-25 MED ORDER — LORATADINE 10 MG PO TABS: 10.0000 mg | ORAL_TABLET | Freq: Every day | ORAL | Status: DC

## 2014-10-25 MED ORDER — FLUTICASONE PROPIONATE 50 MCG/ACT NA SUSP: 2.0000 | Freq: Every day | NASAL | Status: DC
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
## 2014-10-25 NOTE — Patient Instructions (Signed)

## 2014-10-26 DIAGNOSIS — R5381 Other malaise: Secondary | ICD-10-CM | POA: Insufficient documentation

## 2014-10-26 DIAGNOSIS — J302 Other seasonal allergic rhinitis: Secondary | ICD-10-CM | POA: Insufficient documentation

## 2014-10-31 ENCOUNTER — Telehealth: Payer: Self-pay

## 2014-10-31 DIAGNOSIS — R937 Abnormal findings on diagnostic imaging of other parts of musculoskeletal system: Secondary | ICD-10-CM

## 2014-10-31 NOTE — Telephone Encounter (Signed)
Done

## 2014-10-31 NOTE — Telephone Encounter (Signed)
Please [facility] the [facility]. Patient had her previous bone density in 2008 at [facility].

## 2014-11-28 ENCOUNTER — Other Ambulatory Visit: Payer: Self-pay | Admitting: Family Medicine

## 2014-11-28 DIAGNOSIS — Z1231 Encounter for screening mammogram for malignant neoplasm of breast: Secondary | ICD-10-CM

## 2014-12-06 ENCOUNTER — Ambulatory Visit (HOSPITAL_COMMUNITY)
Admission: RE | Admit: 2014-12-06 | Discharge: 2014-12-06 | Disposition: A | Discharge status: Home / Self Care | Payer: Medicare Other | Source: Ambulatory Visit | Attending: Family Medicine | Admitting: Family Medicine

## 2014-12-06 DIAGNOSIS — Z1231 Encounter for screening mammogram for malignant neoplasm of breast: Secondary | ICD-10-CM | POA: Diagnosis not present

## 2015-03-14 ENCOUNTER — Encounter: Payer: Medicare Other | Admitting: Family Medicine

## 2015-03-14 ENCOUNTER — Ambulatory Visit (INDEPENDENT_AMBULATORY_CARE_PROVIDER_SITE_OTHER): Payer: Medicare Other | Admitting: Family Medicine

## 2015-03-14 ENCOUNTER — Encounter: Payer: Self-pay | Admitting: Family Medicine

## 2015-03-14 VITALS — BP 132/76 | HR 86 | Temp 98.2°F | Resp 16 | Ht 66.0 in | Wt 191.4 lb

## 2015-03-14 DIAGNOSIS — I10 Essential (primary) hypertension: Secondary | ICD-10-CM

## 2015-03-14 DIAGNOSIS — J302 Other seasonal allergic rhinitis: Secondary | ICD-10-CM | POA: Diagnosis not present

## 2015-03-14 DIAGNOSIS — S29019A Strain of muscle and tendon of unspecified wall of thorax, initial encounter: Secondary | ICD-10-CM

## 2015-03-14 DIAGNOSIS — R5381 Other malaise: Secondary | ICD-10-CM | POA: Diagnosis not present

## 2015-03-14 DIAGNOSIS — Z23 Encounter for immunization: Secondary | ICD-10-CM

## 2015-03-14 DIAGNOSIS — H539 Unspecified visual disturbance: Secondary | ICD-10-CM

## 2015-03-14 DIAGNOSIS — E2839 Other primary ovarian failure: Secondary | ICD-10-CM

## 2015-03-14 DIAGNOSIS — J449 Chronic obstructive pulmonary disease, unspecified: Secondary | ICD-10-CM | POA: Diagnosis not present

## 2015-03-14 DIAGNOSIS — Z0000 Encounter for general adult medical examination without abnormal findings: Secondary | ICD-10-CM | POA: Diagnosis not present

## 2015-03-14 DIAGNOSIS — E669 Obesity, unspecified: Secondary | ICD-10-CM

## 2015-03-14 DIAGNOSIS — S2341XA Sprain of ribs, initial encounter: Secondary | ICD-10-CM | POA: Diagnosis not present

## 2015-03-14 DIAGNOSIS — R7302 Impaired glucose tolerance (oral): Secondary | ICD-10-CM | POA: Diagnosis not present

## 2015-03-14 DIAGNOSIS — R42 Dizziness and giddiness: Secondary | ICD-10-CM

## 2015-03-14 LAB — CBC WITH DIFFERENTIAL/PLATELET
Basophils Absolute: 0 10*3/uL (ref 0.0–0.1)
Basophils Relative: 0 % (ref 0–1)
EOS ABS: 0.3 10*3/uL (ref 0.0–0.7)
Eosinophils Relative: 3 % (ref 0–5)
HCT: 43.5 % (ref 36.0–46.0)
Hemoglobin: 15.2 g/dL — ABNORMAL HIGH (ref 12.0–15.0)
LYMPHS ABS: 3 10*3/uL (ref 0.7–4.0)
LYMPHS PCT: 35 % (ref 12–46)
MCH: 30 pg (ref 26.0–34.0)
MCHC: 34.9 g/dL (ref 30.0–36.0)
MCV: 86 fL (ref 78.0–100.0)
MONO ABS: 0.5 10*3/uL (ref 0.1–1.0)
MPV: 10.1 fL (ref 8.6–12.4)
Monocytes Relative: 6 % (ref 3–12)
NEUTROS PCT: 56 % (ref 43–77)
Neutro Abs: 4.9 10*3/uL (ref 1.7–7.7)
Platelets: 290 10*3/uL (ref 150–400)
RBC: 5.06 MIL/uL (ref 3.87–5.11)
RDW: 14.1 % (ref 11.5–15.5)
WBC: 8.7 10*3/uL (ref 4.0–10.5)

## 2015-03-14 LAB — COMPREHENSIVE METABOLIC PANEL
ALBUMIN: 4.3 g/dL (ref 3.5–5.2)
ALT: 15 U/L (ref 0–35)
AST: 18 U/L (ref 0–37)
Alkaline Phosphatase: 93 U/L (ref 39–117)
BILIRUBIN TOTAL: 0.6 mg/dL (ref 0.2–1.2)
BUN: 12 mg/dL (ref 6–23)
CO2: 29 meq/L (ref 19–32)
Calcium: 10 mg/dL (ref 8.4–10.5)
Chloride: 102 mEq/L (ref 96–112)
Creat: 0.88 mg/dL (ref 0.50–1.10)
Glucose, Bld: 95 mg/dL (ref 70–99)
Potassium: 3.4 mEq/L — ABNORMAL LOW (ref 3.5–5.3)
SODIUM: 143 meq/L (ref 135–145)
Total Protein: 7.8 g/dL (ref 6.0–8.3)

## 2015-03-14 LAB — POCT URINALYSIS DIPSTICK
Glucose, UA: NEGATIVE
Nitrite, UA: NEGATIVE
SPEC GRAV UA: 1.02
Urobilinogen, UA: 0.2
pH, UA: 6

## 2015-03-14 LAB — HEMOGLOBIN A1C
HEMOGLOBIN A1C: 6.3 % — AB (ref <5.7)
Mean Plasma Glucose: 134 mg/dL — ABNORMAL HIGH (ref <117)

## 2015-03-14 MED ORDER — METHOCARBAMOL 500 MG PO TABS: 500.0000 mg | ORAL_TABLET | Freq: Every evening | ORAL | Status: DC | PRN

## 2015-03-14 MED ORDER — HYDROCHLOROTHIAZIDE 25 MG PO TABS: 25.0000 mg | ORAL_TABLET | Freq: Every day | ORAL | Status: DC

## 2015-03-14 MED ORDER — MELOXICAM 15 MG PO TABS: 15.0000 mg | ORAL_TABLET | Freq: Every day | ORAL | Status: DC
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
## 2015-03-14 NOTE — Patient Instructions (Addendum)

## 2015-03-21 ENCOUNTER — Ambulatory Visit
Admission: RE | Admit: 2015-03-21 | Discharge: 2015-03-21 | Disposition: A | Discharge status: Home / Self Care | Payer: Medicare Other | Source: Ambulatory Visit | Attending: Family Medicine | Admitting: Family Medicine

## 2015-03-21 DIAGNOSIS — E2839 Other primary ovarian failure: Secondary | ICD-10-CM

## 2015-03-21 DIAGNOSIS — Z780 Asymptomatic menopausal state: Secondary | ICD-10-CM | POA: Diagnosis not present

## 2015-03-21 DIAGNOSIS — Z13820 Encounter for screening for osteoporosis: Secondary | ICD-10-CM | POA: Diagnosis not present

## 2015-04-06 DIAGNOSIS — R42 Dizziness and giddiness: Secondary | ICD-10-CM | POA: Diagnosis not present

## 2015-04-06 DIAGNOSIS — H9113 Presbycusis, bilateral: Secondary | ICD-10-CM | POA: Diagnosis not present

## 2015-04-06 DIAGNOSIS — K219 Gastro-esophageal reflux disease without esophagitis: Secondary | ICD-10-CM | POA: Diagnosis not present

## 2015-04-06 DIAGNOSIS — H8111 Benign paroxysmal vertigo, right ear: Secondary | ICD-10-CM | POA: Diagnosis not present

## 2015-04-10 DIAGNOSIS — H43813 Vitreous degeneration, bilateral: Secondary | ICD-10-CM | POA: Diagnosis not present

## 2015-04-10 DIAGNOSIS — E119 Type 2 diabetes mellitus without complications: Secondary | ICD-10-CM | POA: Diagnosis not present

## 2015-04-10 LAB — HM DIABETES EYE EXAM

## 2015-04-24 ENCOUNTER — Encounter: Payer: Self-pay | Admitting: Family Medicine

## 2015-05-18 ENCOUNTER — Encounter: Payer: Self-pay | Admitting: Family Medicine

## 2015-05-22 ENCOUNTER — Encounter: Payer: Self-pay | Admitting: Physician Assistant

## 2015-05-31 ENCOUNTER — Other Ambulatory Visit: Payer: Self-pay

## 2015-05-31 DIAGNOSIS — I10 Essential (primary) hypertension: Secondary | ICD-10-CM

## 2015-05-31 MED ORDER — MELOXICAM 15 MG PO TABS: 15.0000 mg | ORAL_TABLET | Freq: Every day | ORAL | Status: DC

## 2015-05-31 MED ORDER — FLUTICASONE PROPIONATE 50 MCG/ACT NA SUSP: 48.0000 | Freq: Every day | NASAL | Status: DC

## 2015-05-31 MED ORDER — HYDROCHLOROTHIAZIDE 25 MG PO TABS: 25.0000 mg | ORAL_TABLET | Freq: Every day | ORAL | Status: DC

## 2015-06-13 ENCOUNTER — Other Ambulatory Visit: Payer: Self-pay | Admitting: Family Medicine

## 2015-09-19 ENCOUNTER — Ambulatory Visit: Payer: Self-pay | Admitting: Family Medicine

## 2015-09-21 ENCOUNTER — Ambulatory Visit (INDEPENDENT_AMBULATORY_CARE_PROVIDER_SITE_OTHER): Payer: Medicare Other | Admitting: Family Medicine

## 2015-09-21 ENCOUNTER — Encounter: Payer: Self-pay | Admitting: Family Medicine

## 2015-09-21 VITALS — BP 95/60 | HR 102 | Temp 98.7°F | Resp 16 | Ht 65.0 in | Wt 196.0 lb

## 2015-09-21 DIAGNOSIS — Z1159 Encounter for screening for other viral diseases: Secondary | ICD-10-CM

## 2015-09-21 DIAGNOSIS — J449 Chronic obstructive pulmonary disease, unspecified: Secondary | ICD-10-CM | POA: Diagnosis not present

## 2015-09-21 DIAGNOSIS — E669 Obesity, unspecified: Secondary | ICD-10-CM

## 2015-09-21 DIAGNOSIS — I10 Essential (primary) hypertension: Secondary | ICD-10-CM | POA: Diagnosis not present

## 2015-09-21 DIAGNOSIS — R5381 Other malaise: Secondary | ICD-10-CM | POA: Diagnosis not present

## 2015-09-21 DIAGNOSIS — R7302 Impaired glucose tolerance (oral): Secondary | ICD-10-CM | POA: Diagnosis not present

## 2015-09-21 DIAGNOSIS — Z23 Encounter for immunization: Secondary | ICD-10-CM

## 2015-09-21 DIAGNOSIS — J302 Other seasonal allergic rhinitis: Secondary | ICD-10-CM

## 2015-09-21 LAB — COMPREHENSIVE METABOLIC PANEL
ALBUMIN: 3.7 g/dL (ref 3.6–5.1)
ALT: 12 U/L (ref 6–29)
AST: 17 U/L (ref 10–35)
Alkaline Phosphatase: 87 U/L (ref 33–130)
BILIRUBIN TOTAL: 0.5 mg/dL (ref 0.2–1.2)
BUN: 12 mg/dL (ref 7–25)
CHLORIDE: 100 mmol/L (ref 98–110)
CO2: 30 mmol/L (ref 20–31)
Calcium: 9.4 mg/dL (ref 8.6–10.4)
Creat: 0.9 mg/dL (ref 0.50–0.99)
Glucose, Bld: 80 mg/dL (ref 65–99)
POTASSIUM: 3.2 mmol/L — AB (ref 3.5–5.3)
Sodium: 143 mmol/L (ref 135–146)
Total Protein: 6.9 g/dL (ref 6.1–8.1)

## 2015-09-21 LAB — CBC WITH DIFFERENTIAL/PLATELET
Basophils Absolute: 0 10*3/uL (ref 0.0–0.1)
Basophils Relative: 0 % (ref 0–1)
EOS PCT: 2 % (ref 0–5)
Eosinophils Absolute: 0.2 10*3/uL (ref 0.0–0.7)
HEMATOCRIT: 41.4 % (ref 36.0–46.0)
Hemoglobin: 14.2 g/dL (ref 12.0–15.0)
LYMPHS PCT: 39 % (ref 12–46)
Lymphs Abs: 2.9 10*3/uL (ref 0.7–4.0)
MCH: 29.9 pg (ref 26.0–34.0)
MCHC: 34.3 g/dL (ref 30.0–36.0)
MCV: 87.2 fL (ref 78.0–100.0)
MONO ABS: 0.5 10*3/uL (ref 0.1–1.0)
MONOS PCT: 6 % (ref 3–12)
MPV: 9.5 fL (ref 8.6–12.4)
Neutro Abs: 4 10*3/uL (ref 1.7–7.7)
Neutrophils Relative %: 53 % (ref 43–77)
Platelets: 346 10*3/uL (ref 150–400)
RBC: 4.75 MIL/uL (ref 3.87–5.11)
RDW: 14.9 % (ref 11.5–15.5)
WBC: 7.5 10*3/uL (ref 4.0–10.5)

## 2015-09-21 LAB — HEMOGLOBIN A1C
Hgb A1c MFr Bld: 6.3 % — ABNORMAL HIGH (ref <5.7)
MEAN PLASMA GLUCOSE: 134 mg/dL — AB (ref <117)

## 2015-09-21 LAB — HEPATITIS C ANTIBODY: HCV Ab: NEGATIVE

## 2015-09-21 MED ORDER — TIOTROPIUM BROMIDE MONOHYDRATE 18 MCG IN CAPS: ORAL_CAPSULE | RESPIRATORY_TRACT | Status: DC

## 2015-09-21 MED ORDER — HYDROCHLOROTHIAZIDE 25 MG PO TABS: 25.0000 mg | ORAL_TABLET | Freq: Every day | ORAL | Status: DC

## 2015-09-21 MED ORDER — BUDESONIDE 32 MCG/ACT NA SUSP: 2.0000 | Freq: Every day | NASAL | Status: DC
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
## 2015-09-21 NOTE — Patient Instructions (Signed)

## 2015-09-26 ENCOUNTER — Telehealth: Payer: Self-pay

## 2015-09-26 NOTE — Telephone Encounter (Signed)
Pharm faxed note that rhinocort is not covered by ins. Fluticasone, azelastine and mometosone are preferred. Do you want change to one of these?

## 2015-10-01 MED ORDER — POTASSIUM CHLORIDE CRYS ER 20 MEQ PO TBCR: 20.0000 meq | EXTENDED_RELEASE_TABLET | Freq: Every day | ORAL | Status: DC

## 2015-10-01 NOTE — Addendum Note (Signed)
Addended by: Wardell Honour on: 10/01/2015 02:03 PM   Modules accepted: Orders

## 2015-10-04 MED ORDER — MOMETASONE FUROATE 50 MCG/ACT NA SUSP: 2.0000 | Freq: Every day | NASAL | Status: DC

## 2015-10-04 NOTE — Telephone Encounter (Signed)
Spoke to pt who stated she has p/up the generic nasonex, but her copay was still over $70. She has tried the flonase in the past and it caused sores in her nose. I gave pt name of other covered alternative, azelastine, and she will check her formulary and call back if it is a lower copay.

## 2015-10-04 NOTE — Telephone Encounter (Signed)
I sent in Mometosone/Nasonex to pharmacy to use daily for allergies/sinuses.  Please advise patient.

## 2016-02-08 ENCOUNTER — Other Ambulatory Visit: Payer: Self-pay | Admitting: Family Medicine

## 2016-02-08 DIAGNOSIS — Z1231 Encounter for screening mammogram for malignant neoplasm of breast: Secondary | ICD-10-CM

## 2016-02-22 ENCOUNTER — Ambulatory Visit: Payer: Medicare Other

## 2016-03-04 ENCOUNTER — Ambulatory Visit
Admission: RE | Admit: 2016-03-04 | Discharge: 2016-03-04 | Disposition: A | Discharge status: Home / Self Care | Payer: Medicare Other | Source: Ambulatory Visit | Attending: Family Medicine | Admitting: Family Medicine

## 2016-03-04 DIAGNOSIS — Z1231 Encounter for screening mammogram for malignant neoplasm of breast: Secondary | ICD-10-CM

## 2016-04-15 ENCOUNTER — Other Ambulatory Visit: Payer: Self-pay | Admitting: Family Medicine

## 2016-04-15 DIAGNOSIS — E119 Type 2 diabetes mellitus without complications: Secondary | ICD-10-CM | POA: Diagnosis not present

## 2016-04-15 DIAGNOSIS — H2512 Age-related nuclear cataract, left eye: Secondary | ICD-10-CM | POA: Diagnosis not present

## 2016-04-15 DIAGNOSIS — H25811 Combined forms of age-related cataract, right eye: Secondary | ICD-10-CM | POA: Diagnosis not present

## 2016-04-22 ENCOUNTER — Other Ambulatory Visit: Payer: Self-pay

## 2016-06-17 ENCOUNTER — Ambulatory Visit (INDEPENDENT_AMBULATORY_CARE_PROVIDER_SITE_OTHER): Payer: Medicare Other | Admitting: Family Medicine

## 2016-06-17 ENCOUNTER — Encounter: Payer: Self-pay | Admitting: Family Medicine

## 2016-06-17 VITALS — BP 136/80 | HR 97 | Temp 98.4°F | Resp 18 | Ht 65.0 in | Wt 178.6 lb

## 2016-06-17 DIAGNOSIS — R7302 Impaired glucose tolerance (oral): Secondary | ICD-10-CM

## 2016-06-17 DIAGNOSIS — Z23 Encounter for immunization: Secondary | ICD-10-CM

## 2016-06-17 DIAGNOSIS — J410 Simple chronic bronchitis: Secondary | ICD-10-CM | POA: Diagnosis not present

## 2016-06-17 DIAGNOSIS — I10 Essential (primary) hypertension: Secondary | ICD-10-CM | POA: Diagnosis not present

## 2016-06-17 DIAGNOSIS — R5381 Other malaise: Secondary | ICD-10-CM | POA: Diagnosis not present

## 2016-06-17 DIAGNOSIS — J302 Other seasonal allergic rhinitis: Secondary | ICD-10-CM

## 2016-06-17 LAB — CBC WITH DIFFERENTIAL/PLATELET
Basophils Absolute: 0 cells/uL (ref 0–200)
Basophils Relative: 0 %
EOS PCT: 1 %
Eosinophils Absolute: 63 cells/uL (ref 15–500)
HCT: 45.5 % — ABNORMAL HIGH (ref 35.0–45.0)
Hemoglobin: 15.3 g/dL (ref 11.7–15.5)
LYMPHS ABS: 2520 {cells}/uL (ref 850–3900)
Lymphocytes Relative: 40 %
MCH: 30.2 pg (ref 27.0–33.0)
MCHC: 33.6 g/dL (ref 32.0–36.0)
MCV: 89.7 fL (ref 80.0–100.0)
MONOS PCT: 7 %
MPV: 11.7 fL (ref 7.5–12.5)
Monocytes Absolute: 441 cells/uL (ref 200–950)
Neutro Abs: 3276 cells/uL (ref 1500–7800)
Neutrophils Relative %: 52 %
PLATELETS: 334 10*3/uL (ref 140–400)
RBC: 5.07 MIL/uL (ref 3.80–5.10)
RDW: 14.1 % (ref 11.0–15.0)
WBC: 6.3 10*3/uL (ref 3.8–10.8)

## 2016-06-17 LAB — COMPREHENSIVE METABOLIC PANEL
ALBUMIN: 3.8 g/dL (ref 3.6–5.1)
ALT: 11 U/L (ref 6–29)
AST: 19 U/L (ref 10–35)
Alkaline Phosphatase: 93 U/L (ref 33–130)
BILIRUBIN TOTAL: 0.6 mg/dL (ref 0.2–1.2)
BUN: 10 mg/dL (ref 7–25)
CHLORIDE: 99 mmol/L (ref 98–110)
CO2: 32 mmol/L — AB (ref 20–31)
Calcium: 10 mg/dL (ref 8.6–10.4)
Creat: 0.88 mg/dL (ref 0.50–0.99)
Glucose, Bld: 86 mg/dL (ref 65–99)
Potassium: 3.1 mmol/L — ABNORMAL LOW (ref 3.5–5.3)
SODIUM: 141 mmol/L (ref 135–146)
TOTAL PROTEIN: 6.9 g/dL (ref 6.1–8.1)

## 2016-06-17 MED ORDER — LORATADINE 10 MG PO TABS: 10.0000 mg | ORAL_TABLET | Freq: Every day | ORAL | 3 refills | Status: DC

## 2016-06-17 MED ORDER — HYDROCHLOROTHIAZIDE 25 MG PO TABS: 25.0000 mg | ORAL_TABLET | Freq: Every day | ORAL | 3 refills | Status: DC

## 2016-06-17 MED ORDER — TIOTROPIUM BROMIDE MONOHYDRATE 18 MCG IN CAPS: ORAL_CAPSULE | RESPIRATORY_TRACT | 11 refills | Status: DC

## 2016-06-17 NOTE — Patient Instructions (Addendum)
   IF you received an x-ray today, you will receive an invoice from [facility]. Please contact  Radiology at [phone] with questions or concerns regarding your invoice.   IF you received labwork today, you will receive an invoice from [facility]. Please contact [facility] at [phone] with questions or concerns regarding your invoice.   Our billing staff will not be able to assist you with questions regarding bills from these companies.  You will be contacted with the lab results as soon as they are available. The fastest way to get your results is to activate your My Chart account. Instructions are located on the last page of this paperwork. If you have not heard from us regarding the results in 2 weeks, please contact this office.     DASH Eating Plan DASH stands for "Dietary Approaches to Stop Hypertension." The DASH eating plan is a healthy eating plan that has been shown to reduce high blood pressure (hypertension). Additional health benefits may include reducing the risk of type 2 diabetes mellitus, heart disease, and stroke. The DASH eating plan may also help with weight loss. WHAT DO I NEED TO KNOW ABOUT THE DASH EATING PLAN? For the DASH eating plan, you will follow these general guidelines:  Choose foods with a percent daily value for sodium of less than 5% (as listed on the food label).  Use salt-free seasonings or herbs instead of table salt or sea salt.  Check with your health care provider or pharmacist before using salt substitutes.  Eat lower-sodium products, often labeled as "lower sodium" or "no salt added."  Eat fresh foods.  Eat more vegetables, fruits, and low-fat dairy products.  Choose whole grains. Look for the word "whole" as the first word in the ingredient list.  Choose fish and skinless chicken or turkey more often than red meat. Limit fish, poultry, and meat to 6 oz (170 g) each day.  Limit sweets,  desserts, sugars, and sugary drinks.  Choose heart-healthy fats.  Limit cheese to 1 oz (28 g) per day.  Eat more home-cooked food and less restaurant, buffet, and fast food.  Limit fried foods.  Cook foods using methods other than frying.  Limit canned vegetables. If you do use them, rinse them well to decrease the sodium.  When eating at a restaurant, ask that your food be prepared with less salt, or no salt if possible. WHAT FOODS CAN I EAT? Seek help from a dietitian for individual calorie needs. Grains Whole grain or whole wheat bread. Brown rice. Whole grain or whole wheat pasta. Quinoa, bulgur, and whole grain cereals. Low-sodium cereals. Corn or whole wheat flour tortillas. Whole grain cornbread. Whole grain crackers. Low-sodium crackers. Vegetables Fresh or frozen vegetables (raw, steamed, roasted, or grilled). Low-sodium or reduced-sodium tomato and vegetable juices. Low-sodium or reduced-sodium tomato sauce and paste. Low-sodium or reduced-sodium canned vegetables.  Fruits All fresh, canned (in natural juice), or frozen fruits. Meat and Other Protein Products Ground beef (85% or leaner), grass-fed beef, or beef trimmed of fat. Skinless chicken or turkey. Ground chicken or turkey. Pork trimmed of fat. All fish and seafood. Eggs. Dried beans, peas, or lentils. Unsalted nuts and seeds. Unsalted canned beans. Dairy Low-fat dairy products, such as skim or 1% milk, 2% or reduced-fat cheeses, low-fat ricotta or cottage cheese, or plain low-fat yogurt. Low-sodium or reduced-sodium cheeses. Fats and Oils Tub margarines without trans fats. Light or reduced-fat mayonnaise and salad dressings (reduced sodium). Avocado. Safflower, olive, or canola   oils. Natural peanut or almond butter. Other Unsalted popcorn and pretzels. The items listed above may not be a complete list of recommended foods or beverages. Contact your dietitian for more options. WHAT FOODS ARE NOT  RECOMMENDED? Grains White bread. White pasta. White rice. Refined cornbread. Bagels and croissants. Crackers that contain trans fat. Vegetables Creamed or fried vegetables. Vegetables in a cheese sauce. Regular canned vegetables. Regular canned tomato sauce and paste. Regular tomato and vegetable juices. Fruits Dried fruits. Canned fruit in light or heavy syrup. Fruit juice. Meat and Other Protein Products Fatty cuts of meat. Ribs, chicken wings, bacon, sausage, bologna, salami, chitterlings, fatback, hot dogs, bratwurst, and packaged luncheon meats. Salted nuts and seeds. Canned beans with salt. Dairy Whole or 2% milk, cream, half-and-half, and cream cheese. Whole-fat or sweetened yogurt. Full-fat cheeses or blue cheese. Nondairy creamers and whipped toppings. Processed cheese, cheese spreads, or cheese curds. Condiments Onion and garlic salt, seasoned salt, table salt, and sea salt. Canned and packaged gravies. Worcestershire sauce. Tartar sauce. Barbecue sauce. Teriyaki sauce. Soy sauce, including reduced sodium. Steak sauce. Fish sauce. Oyster sauce. Cocktail sauce. Horseradish. Ketchup and mustard. Meat flavorings and tenderizers. Bouillon cubes. Hot sauce. Tabasco sauce. Marinades. Taco seasonings. Relishes. Fats and Oils Butter, stick margarine, lard, shortening, ghee, and bacon fat. Coconut, palm kernel, or palm oils. Regular salad dressings. Other Pickles and olives. Salted popcorn and pretzels. The items listed above may not be a complete list of foods and beverages to avoid. Contact your dietitian for more information. WHERE CAN I FIND MORE INFORMATION? National Heart, Lung, and Blood Institute: www.nhlbi.nih.gov/health/health-topics/topics/dash/   This information is not intended to replace advice given to you by your health care provider. Make sure you discuss any questions you have with your health care provider.   Document Released: 07/31/2011 Document Revised: 09/01/2014  Document Reviewed: 06/15/2013 Elsevier Interactive Patient Education 2016 Elsevier Inc.  
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
## 2016-06-18 LAB — HEMOGLOBIN A1C
Hgb A1c MFr Bld: 6 % — ABNORMAL HIGH (ref <5.7)
MEAN PLASMA GLUCOSE: 126 mg/dL

## 2016-09-10 ENCOUNTER — Encounter: Payer: Medicare Other | Admitting: Family Medicine

## 2016-09-16 ENCOUNTER — Encounter: Payer: Self-pay | Admitting: Family Medicine

## 2016-09-16 ENCOUNTER — Ambulatory Visit (INDEPENDENT_AMBULATORY_CARE_PROVIDER_SITE_OTHER): Payer: Medicare Other | Admitting: Family Medicine

## 2016-09-16 VITALS — BP 134/69 | HR 109 | Temp 99.1°F | Resp 16 | Ht 65.0 in | Wt 174.2 lb

## 2016-09-16 DIAGNOSIS — I10 Essential (primary) hypertension: Secondary | ICD-10-CM | POA: Diagnosis not present

## 2016-09-16 DIAGNOSIS — E7800 Pure hypercholesterolemia, unspecified: Secondary | ICD-10-CM | POA: Diagnosis not present

## 2016-09-16 DIAGNOSIS — Z0000 Encounter for general adult medical examination without abnormal findings: Secondary | ICD-10-CM | POA: Diagnosis not present

## 2016-09-16 DIAGNOSIS — R5381 Other malaise: Secondary | ICD-10-CM | POA: Diagnosis not present

## 2016-09-16 DIAGNOSIS — J302 Other seasonal allergic rhinitis: Secondary | ICD-10-CM

## 2016-09-16 DIAGNOSIS — Z01419 Encounter for gynecological examination (general) (routine) without abnormal findings: Secondary | ICD-10-CM | POA: Diagnosis not present

## 2016-09-16 DIAGNOSIS — R7302 Impaired glucose tolerance (oral): Secondary | ICD-10-CM

## 2016-09-16 DIAGNOSIS — E669 Obesity, unspecified: Secondary | ICD-10-CM

## 2016-09-16 DIAGNOSIS — Z124 Encounter for screening for malignant neoplasm of cervix: Secondary | ICD-10-CM | POA: Diagnosis not present

## 2016-09-16 DIAGNOSIS — J410 Simple chronic bronchitis: Secondary | ICD-10-CM

## 2016-09-16 LAB — POCT URINALYSIS DIP (MANUAL ENTRY)
Glucose, UA: NEGATIVE
NITRITE UA: NEGATIVE
RBC UA: NEGATIVE
Spec Grav, UA: 1.02
UROBILINOGEN UA: 0.2

## 2016-09-16 MED ORDER — FLUTICASONE PROPIONATE 50 MCG/ACT NA SUSP: 2.0000 | Freq: Every day | NASAL | 3 refills | Status: DC

## 2016-09-16 MED ORDER — LORATADINE 10 MG PO TABS: 10.0000 mg | ORAL_TABLET | Freq: Every day | ORAL | 3 refills | Status: DC

## 2016-09-16 MED ORDER — POTASSIUM CHLORIDE CRYS ER 20 MEQ PO TBCR: 20.0000 meq | EXTENDED_RELEASE_TABLET | Freq: Every day | ORAL | 3 refills | Status: DC

## 2016-09-16 MED ORDER — TIOTROPIUM BROMIDE MONOHYDRATE 18 MCG IN CAPS: ORAL_CAPSULE | RESPIRATORY_TRACT | 11 refills | Status: DC

## 2016-09-16 MED ORDER — HYDROCHLOROTHIAZIDE 25 MG PO TABS: 25.0000 mg | ORAL_TABLET | Freq: Every day | ORAL | 3 refills | Status: DC
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
## 2016-09-16 NOTE — Patient Instructions (Signed)
     IF you received an x-ray today, you will receive an invoice from [facility]. Please contact [facility] at [phone] with questions or concerns regarding your invoice.   IF you received labwork today, you will receive an invoice from [facility]. Please contact [facility] at [phone] with questions or concerns regarding your invoice.   Our billing staff will not be able to assist you with questions regarding bills from these companies.  You will be contacted with the lab results as soon as they are available. The fastest way to get your results is to activate your My Chart account. Instructions are located on the last page of this paperwork. If you have not heard from us regarding the results in 2 weeks, please contact this office.     

## 2016-09-17 LAB — COMPREHENSIVE METABOLIC PANEL
ALT: 13 IU/L (ref 0–32)
AST: 14 IU/L (ref 0–40)
Albumin/Globulin Ratio: 1.3 (ref 1.2–2.2)
Albumin: 4.2 g/dL (ref 3.6–4.8)
Alkaline Phosphatase: 114 IU/L (ref 39–117)
BILIRUBIN TOTAL: 0.5 mg/dL (ref 0.0–1.2)
BUN/Creatinine Ratio: 9 — ABNORMAL LOW (ref 12–28)
BUN: 9 mg/dL (ref 8–27)
CALCIUM: 10.6 mg/dL — AB (ref 8.7–10.3)
CHLORIDE: 95 mmol/L — AB (ref 96–106)
CO2: 30 mmol/L — AB (ref 18–29)
Creatinine, Ser: 0.98 mg/dL (ref 0.57–1.00)
GFR calc non Af Amer: 59 mL/min/{1.73_m2} — ABNORMAL LOW (ref >59)
GFR, EST AFRICAN AMERICAN: 69 mL/min/{1.73_m2} (ref >59)
GLUCOSE: 103 mg/dL — AB (ref 65–99)
Globulin, Total: 3.2 g/dL (ref 1.5–4.5)
Potassium: 3.2 mmol/L — ABNORMAL LOW (ref 3.5–5.2)
Sodium: 142 mmol/L (ref 134–144)
TOTAL PROTEIN: 7.4 g/dL (ref 6.0–8.5)

## 2016-09-17 LAB — CBC WITH DIFFERENTIAL/PLATELET
BASOS ABS: 0 10*3/uL (ref 0.0–0.2)
Basos: 0 %
EOS (ABSOLUTE): 0.1 10*3/uL (ref 0.0–0.4)
Eos: 1 %
Hematocrit: 47.8 % — ABNORMAL HIGH (ref 34.0–46.6)
Hemoglobin: 16.7 g/dL — ABNORMAL HIGH (ref 11.1–15.9)
IMMATURE GRANS (ABS): 0 10*3/uL (ref 0.0–0.1)
IMMATURE GRANULOCYTES: 0 %
LYMPHS: 32 %
Lymphocytes Absolute: 2.8 10*3/uL (ref 0.7–3.1)
MCH: 31.7 pg (ref 26.6–33.0)
MCHC: 34.9 g/dL (ref 31.5–35.7)
MCV: 91 fL (ref 79–97)
Monocytes Absolute: 0.5 10*3/uL (ref 0.1–0.9)
Monocytes: 5 %
NEUTROS PCT: 62 %
Neutrophils Absolute: 5.4 10*3/uL (ref 1.4–7.0)
Platelets: 370 10*3/uL (ref 150–379)
RBC: 5.27 x10E6/uL (ref 3.77–5.28)
RDW: 14.3 % (ref 12.3–15.4)
WBC: 8.7 10*3/uL (ref 3.4–10.8)

## 2016-09-17 LAB — HEMOGLOBIN A1C
Est. average glucose Bld gHb Est-mCnc: 134 mg/dL
Hgb A1c MFr Bld: 6.3 % — ABNORMAL HIGH (ref 4.8–5.6)

## 2016-09-17 LAB — LIPID PANEL
CHOLESTEROL TOTAL: 164 mg/dL (ref 100–199)
Chol/HDL Ratio: 3.4 ratio units (ref 0.0–4.4)
HDL: 48 mg/dL (ref >39)
LDL Calculated: 87 mg/dL (ref 0–99)
TRIGLYCERIDES: 145 mg/dL (ref 0–149)
VLDL Cholesterol Cal: 29 mg/dL (ref 5–40)

## 2016-09-19 LAB — PAP IG W/ RFLX HPV ASCU: PAP Smear Comment: 0

## 2017-01-28 ENCOUNTER — Other Ambulatory Visit: Payer: Self-pay | Admitting: Family Medicine

## 2017-01-28 DIAGNOSIS — Z1231 Encounter for screening mammogram for malignant neoplasm of breast: Secondary | ICD-10-CM

## 2017-03-05 ENCOUNTER — Ambulatory Visit
Admission: RE | Admit: 2017-03-05 | Discharge: 2017-03-05 | Disposition: A | Discharge status: Home / Self Care | Payer: Medicare Other | Source: Ambulatory Visit | Attending: Family Medicine | Admitting: Family Medicine

## 2017-03-05 DIAGNOSIS — Z1231 Encounter for screening mammogram for malignant neoplasm of breast: Secondary | ICD-10-CM

## 2017-03-17 ENCOUNTER — Encounter: Payer: Self-pay | Admitting: Family Medicine

## 2017-03-17 ENCOUNTER — Ambulatory Visit (INDEPENDENT_AMBULATORY_CARE_PROVIDER_SITE_OTHER): Payer: Medicare Other | Admitting: Family Medicine

## 2017-03-17 VITALS — BP 116/75 | HR 77 | Temp 98.1°F | Resp 18 | Ht 66.14 in | Wt 173.0 lb

## 2017-03-17 DIAGNOSIS — E669 Obesity, unspecified: Secondary | ICD-10-CM | POA: Diagnosis not present

## 2017-03-17 DIAGNOSIS — Z23 Encounter for immunization: Secondary | ICD-10-CM

## 2017-03-17 DIAGNOSIS — E7800 Pure hypercholesterolemia, unspecified: Secondary | ICD-10-CM

## 2017-03-17 DIAGNOSIS — R5381 Other malaise: Secondary | ICD-10-CM | POA: Diagnosis not present

## 2017-03-17 DIAGNOSIS — R7302 Impaired glucose tolerance (oral): Secondary | ICD-10-CM

## 2017-03-17 DIAGNOSIS — I10 Essential (primary) hypertension: Secondary | ICD-10-CM

## 2017-03-17 DIAGNOSIS — J410 Simple chronic bronchitis: Secondary | ICD-10-CM | POA: Diagnosis not present

## 2017-03-17 DIAGNOSIS — E66811 Obesity, class 1: Secondary | ICD-10-CM

## 2017-03-17 DIAGNOSIS — S61011A Laceration without foreign body of right thumb without damage to nail, initial encounter: Secondary | ICD-10-CM | POA: Diagnosis not present

## 2017-03-17 DIAGNOSIS — J302 Other seasonal allergic rhinitis: Secondary | ICD-10-CM

## 2017-03-17 MED ORDER — LORATADINE 10 MG PO TABS: 10.0000 mg | ORAL_TABLET | Freq: Every day | ORAL | 3 refills | Status: DC
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
## 2017-03-17 NOTE — Patient Instructions (Addendum)
   IF you received an x-ray today, you will receive an invoice from [facility]. Please contact [facility] at [phone] with questions or concerns regarding your invoice.   IF you received labwork today, you will receive an invoice from [facility]. Please contact [facility] at [phone] with questions or concerns regarding your invoice.   Our billing staff will not be able to assist you with questions regarding bills from these companies.  You will be contacted with the lab results as soon as they are available. The fastest way to get your results is to activate your My Chart account. Instructions are located on the last page of this paperwork. If you have not heard from us regarding the results in 2 weeks, please contact this office.      Managing Your Hypertension Hypertension is commonly called high blood pressure. This is when the force of your blood pressing against the walls of your arteries is too strong. Arteries are blood vessels that carry blood from your heart throughout your body. Hypertension forces the heart to work harder to pump blood, and may cause the arteries to become narrow or stiff. Having untreated or uncontrolled hypertension can cause heart attack, stroke, kidney disease, and other problems. What are blood pressure readings? A blood pressure reading consists of a higher number over a lower number. Ideally, your blood pressure should be below 120/80. The first ("top") number is called the systolic pressure. It is a measure of the pressure in your arteries as your heart beats. The second ("bottom") number is called the diastolic pressure. It is a measure of the pressure in your arteries as the heart relaxes. What does my blood pressure reading mean? Blood pressure is classified into four stages. Based on your blood pressure reading, your health care provider may use the following stages to determine what type of treatment you need, if any. Systolic  pressure and diastolic pressure are measured in a unit called mm Hg. Normal  Systolic pressure: below 120.  Diastolic pressure: below 80. Elevated  Systolic pressure: 120-129.  Diastolic pressure: below 80. Hypertension stage 1  Systolic pressure: 130-139.  Diastolic pressure: 80-89. Hypertension stage 2  Systolic pressure: 140 or above.  Diastolic pressure: 90 or above. What health risks are associated with hypertension? Managing your hypertension is an important responsibility. Uncontrolled hypertension can lead to:  A heart attack.  A stroke.  A weakened blood vessel (aneurysm).  Heart failure.  Kidney damage.  Eye damage.  Metabolic syndrome.  Memory and concentration problems.  What changes can I make to manage my hypertension? Hypertension can be managed by making lifestyle changes and possibly by taking medicines. Your health care provider will help you make a plan to bring your blood pressure within a normal range. Eating and drinking  Eat a diet that is high in fiber and potassium, and low in salt (sodium), added sugar, and fat. An example eating plan is called the DASH (Dietary Approaches to Stop Hypertension) diet. To eat this way: ? Eat plenty of fresh fruits and vegetables. Try to fill half of your plate at each meal with fruits and vegetables. ? Eat whole grains, such as whole wheat pasta, brown rice, or whole grain bread. Fill about one quarter of your plate with whole grains. ? Eat low-fat diary products. ? Avoid fatty cuts of meat, processed or cured meats, and poultry with skin. Fill about one quarter of your plate with lean proteins such as fish, chicken without skin, beans, eggs,   and tofu. ? Avoid premade and processed foods. These tend to be higher in sodium, added sugar, and fat.  Reduce your daily sodium intake. Most people with hypertension should eat less than 1,500 mg of sodium a day.  Limit alcohol intake to no more than 1 drink a day  for nonpregnant women and 2 drinks a day for men. One drink equals 12 oz of beer, 5 oz of wine, or 1 oz of hard liquor. Lifestyle  Work with your health care provider to maintain a healthy body weight, or to lose weight. Ask what an ideal weight is for you.  Get at least 30 minutes of exercise that causes your heart to beat faster (aerobic exercise) most days of the week. Activities may include walking, swimming, or biking.  Include exercise to strengthen your muscles (resistance exercise), such as weight lifting, as part of your weekly exercise routine. Try to do these types of exercises for 30 minutes at least 3 days a week.  Do not use any products that contain nicotine or tobacco, such as cigarettes and e-cigarettes. If you need help quitting, ask your health care provider.  Control any long-term (chronic) conditions you have, such as high cholesterol or diabetes. Monitoring  Monitor your blood pressure at home as told by your health care provider. Your personal target blood pressure may vary depending on your medical conditions, your age, and other factors.  Have your blood pressure checked regularly, as often as told by your health care provider. Working with your health care provider  Review all the medicines you take with your health care provider because there may be side effects or interactions.  Talk with your health care provider about your diet, exercise habits, and other lifestyle factors that may be contributing to hypertension.  Visit your health care provider regularly. Your health care provider can help you create and adjust your plan for managing hypertension. Will I need medicine to control my blood pressure? Your health care provider may prescribe medicine if lifestyle changes are not enough to get your blood pressure under control, and if:  Your systolic blood pressure is 130 or higher.  Your diastolic blood pressure is 80 or higher.  Take medicines only as told  by your health care provider. Follow the directions carefully. Blood pressure medicines must be taken as prescribed. The medicine does not work as well when you skip doses. Skipping doses also puts you at risk for problems. Contact a health care provider if:  You think you are having a reaction to medicines you have taken.  You have repeated (recurrent) headaches.  You feel dizzy.  You have swelling in your ankles.  You have trouble with your vision. Get help right away if:  You develop a severe headache or confusion.  You have unusual weakness or numbness, or you feel faint.  You have severe pain in your chest or abdomen.  You vomit repeatedly.  You have trouble breathing. Summary  Hypertension is when the force of blood pumping through your arteries is too strong. If this condition is not controlled, it may put you at risk for serious complications.  Your personal target blood pressure may vary depending on your medical conditions, your age, and other factors. For most people, a normal blood pressure is less than 120/80.  Hypertension is managed by lifestyle changes, medicines, or both. Lifestyle changes include weight loss, eating a healthy, low-sodium diet, exercising more, and limiting alcohol. This information is not intended to replace advice   given to you by your health care provider. Make sure you discuss any questions you have with your health care provider. Document Released: 05/05/2012 Document Revised: 07/09/2016 Document Reviewed: 07/09/2016 Elsevier Interactive Patient Education  2018 [employer]  

## 2017-03-18 LAB — LIPID PANEL
CHOL/HDL RATIO: 3.1 ratio (ref 0.0–4.4)
Cholesterol, Total: 163 mg/dL (ref 100–199)
HDL: 53 mg/dL (ref >39)
LDL CALC: 85 mg/dL (ref 0–99)
TRIGLYCERIDES: 125 mg/dL (ref 0–149)
VLDL CHOLESTEROL CAL: 25 mg/dL (ref 5–40)

## 2017-03-18 LAB — COMPREHENSIVE METABOLIC PANEL
ALBUMIN: 4.5 g/dL (ref 3.6–4.8)
ALT: 14 IU/L (ref 0–32)
AST: 20 IU/L (ref 0–40)
Albumin/Globulin Ratio: 1.4 (ref 1.2–2.2)
Alkaline Phosphatase: 113 IU/L (ref 39–117)
BILIRUBIN TOTAL: 0.5 mg/dL (ref 0.0–1.2)
BUN / CREAT RATIO: 9 — AB (ref 12–28)
BUN: 9 mg/dL (ref 8–27)
CHLORIDE: 100 mmol/L (ref 96–106)
CO2: 27 mmol/L (ref 20–29)
Calcium: 10.8 mg/dL — ABNORMAL HIGH (ref 8.7–10.3)
Creatinine, Ser: 0.98 mg/dL (ref 0.57–1.00)
GFR calc non Af Amer: 59 mL/min/{1.73_m2} — ABNORMAL LOW (ref >59)
GFR, EST AFRICAN AMERICAN: 69 mL/min/{1.73_m2} (ref >59)
GLUCOSE: 93 mg/dL (ref 65–99)
Globulin, Total: 3.3 g/dL (ref 1.5–4.5)
Potassium: 3.9 mmol/L (ref 3.5–5.2)
Sodium: 144 mmol/L (ref 134–144)
TOTAL PROTEIN: 7.8 g/dL (ref 6.0–8.5)

## 2017-03-18 LAB — CBC WITH DIFFERENTIAL/PLATELET
BASOS ABS: 0 10*3/uL (ref 0.0–0.2)
BASOS: 1 %
EOS (ABSOLUTE): 0.1 10*3/uL (ref 0.0–0.4)
Eos: 1 %
HEMOGLOBIN: 16 g/dL — AB (ref 11.1–15.9)
Hematocrit: 46.5 % (ref 34.0–46.6)
IMMATURE GRANS (ABS): 0 10*3/uL (ref 0.0–0.1)
Immature Granulocytes: 0 %
LYMPHS: 43 %
Lymphocytes Absolute: 3.8 10*3/uL — ABNORMAL HIGH (ref 0.7–3.1)
MCH: 30.6 pg (ref 26.6–33.0)
MCHC: 34.4 g/dL (ref 31.5–35.7)
MCV: 89 fL (ref 79–97)
MONOCYTES: 5 %
Monocytes Absolute: 0.5 10*3/uL (ref 0.1–0.9)
NEUTROS ABS: 4.5 10*3/uL (ref 1.4–7.0)
Neutrophils: 50 %
Platelets: 317 10*3/uL (ref 150–379)
RBC: 5.23 x10E6/uL (ref 3.77–5.28)
RDW: 13.5 % (ref 12.3–15.4)
WBC: 8.9 10*3/uL (ref 3.4–10.8)

## 2017-03-18 LAB — PARATHYROID HORMONE, INTACT (NO CA): PTH: 26 pg/mL (ref 15–65)

## 2017-03-18 LAB — HEMOGLOBIN A1C
Est. average glucose Bld gHb Est-mCnc: 137 mg/dL
Hgb A1c MFr Bld: 6.4 % — ABNORMAL HIGH (ref 4.8–5.6)

## 2017-04-15 DIAGNOSIS — H25813 Combined forms of age-related cataract, bilateral: Secondary | ICD-10-CM | POA: Diagnosis not present

## 2017-04-15 DIAGNOSIS — E119 Type 2 diabetes mellitus without complications: Secondary | ICD-10-CM | POA: Diagnosis not present

## 2017-04-15 DIAGNOSIS — H35373 Puckering of macula, bilateral: Secondary | ICD-10-CM | POA: Diagnosis not present

## 2017-05-04 DIAGNOSIS — H2511 Age-related nuclear cataract, right eye: Secondary | ICD-10-CM | POA: Diagnosis not present

## 2017-05-04 DIAGNOSIS — H25811 Combined forms of age-related cataract, right eye: Secondary | ICD-10-CM | POA: Diagnosis not present

## 2017-05-12 DIAGNOSIS — H2512 Age-related nuclear cataract, left eye: Secondary | ICD-10-CM | POA: Diagnosis not present

## 2017-05-18 DIAGNOSIS — H2512 Age-related nuclear cataract, left eye: Secondary | ICD-10-CM | POA: Diagnosis not present

## 2017-08-31 ENCOUNTER — Encounter: Payer: Medicare Other | Admitting: Family Medicine

## 2017-09-17 ENCOUNTER — Ambulatory Visit (INDEPENDENT_AMBULATORY_CARE_PROVIDER_SITE_OTHER): Payer: Medicare Other

## 2017-09-17 VITALS — BP 124/82 | HR 112 | Temp 98.8°F | Ht 66.0 in | Wt 178.5 lb

## 2017-09-17 DIAGNOSIS — I10 Essential (primary) hypertension: Secondary | ICD-10-CM

## 2017-09-17 DIAGNOSIS — Z0000 Encounter for general adult medical examination without abnormal findings: Secondary | ICD-10-CM

## 2017-09-17 NOTE — Patient Instructions (Addendum)
Ms. Nicole Spence , Thank you for taking time to come for your Medicare Wellness Visit. I appreciate your ongoing commitment to your health goals. Please review the following plan we discussed and let me know if I can assist you in the future.   Screening recommendations/referrals: Colonoscopy: You state you had this done about 2 years ago. Please contact your GI doctor and have him send [name] your report so we can up date your chart.  Mammogram: up to date, next due 03/06/2019 Bone Density: up to date, next due 03/20/2022 Recommended yearly ophthalmology/optometry visit for glaucoma screening and checkup Recommended yearly dental visit for hygiene and checkup  Vaccinations: Influenza vaccine: Will wait until you see Dr. Tamala Julian on Monday to see if it is ok to receive this vaccine due to sickness Pneumococcal vaccine: up to date Tdap vaccine: up to date, next due 03/18/2027 Shingles vaccine: Check with your pharmacy about receiving the Shingrix vaccine    Advanced directives: Advance directive discussed with you today. Even though you declined this today please call our office should you change your mind and we can give you the proper paperwork for you to fill out.   Conditions/risks identified: Try to start exercising more on a consistent basis.  Next appointment: 09/21/17 @ 5:40 pm with Dr. Tamala Julian, next Medicare Wellness visit is 09/20/2018 @ 8:20 am with [name] 70 Years and Older, Female Preventive care refers to lifestyle choices and visits with your health care provider that can promote health and wellness. What does preventive care include?  A yearly physical exam. This is also called an annual well check.  Dental exams once or twice a year.  Routine eye exams. Ask your health care provider how often you should have your eyes checked.  Personal lifestyle choices, including:  Daily care of your teeth and gums.  Regular physical activity.  Eating a healthy  diet.  Avoiding tobacco and drug use.  Limiting alcohol use.  Practicing safe sex.  Taking low-dose aspirin every day.  Taking vitamin and mineral supplements as recommended by your health care provider. What happens during an annual well check? The services and screenings done by your health care provider during your annual well check will depend on your age, overall health, lifestyle risk factors, and family history of disease. Counseling  Your health care provider may ask you questions about your:  Alcohol use.  Tobacco use.  Drug use.  Emotional well-being.  Home and relationship well-being.  Sexual activity.  Eating habits.  History of falls.  Memory and ability to understand (cognition).  Work and work Statistician.  Reproductive health. Screening  You may have the following tests or measurements:  Height, weight, and BMI.  Blood pressure.  Lipid and cholesterol levels. These may be checked every 5 years, or more frequently if you are over 18 years old.  Skin check.  Lung cancer screening. You may have this screening every year starting at age 60 if you have a 30-pack-year history of smoking and currently smoke or have quit within the past 15 years.  Fecal occult blood test (FOBT) of the stool. You may have this test every year starting at age 33.  Flexible sigmoidoscopy or colonoscopy. You may have a sigmoidoscopy every 5 years or a colonoscopy every 10 years starting at age 27.  Hepatitis C blood test.  Hepatitis B blood test.  Sexually transmitted disease (STD) testing.  Diabetes screening. This is done by checking your blood  sugar (glucose) after you have not eaten for a while (fasting). You may have this done every 1-3 years.  Bone density scan. This is done to screen for osteoporosis. You may have this done starting at age 31.  Mammogram. This may be done every 1-2 years. Talk to your health care provider about how often you should have  regular mammograms. Talk with your health care provider about your test results, treatment options, and if necessary, the need for more tests. Vaccines  Your health care provider may recommend certain vaccines, such as:  Influenza vaccine. This is recommended every year.  Tetanus, diphtheria, and acellular pertussis (Tdap, Td) vaccine. You may need a Td booster every 10 years.  Zoster vaccine. You may need this after age 23.  Pneumococcal 13-valent conjugate (PCV13) vaccine. One dose is recommended after age 52.  Pneumococcal polysaccharide (PPSV23) vaccine. One dose is recommended after age 77. Talk to your health care provider about which screenings and vaccines you need and how often you need them. This information is not intended to replace advice given to you by your health care provider. Make sure you discuss any questions you have with your health care provider. Document Released: 09/07/2015 Document Revised: 04/30/2016 Document Reviewed: 06/12/2015 Elsevier Interactive Patient Education  2017 [employer] Prevention in the Home Falls can cause injuries. They can happen to people of all ages. There are many things you can do to make your home safe and to help prevent falls. What can I do on the outside of my home?  Regularly fix the edges of walkways and driveways and fix any cracks.  Remove anything that might make you trip as you walk through a door, such as a raised step or threshold.  Trim any bushes or trees on the path to your home.  Use bright outdoor lighting.  Clear any walking paths of anything that might make someone trip, such as rocks or tools.  Regularly check to see if handrails are loose or broken. Make sure that both sides of any steps have handrails.  Any raised decks and porches should have guardrails on the edges.  Have any leaves, snow, or ice cleared regularly.  Use sand or salt on walking paths during winter.  Clean up any spills in your  garage right away. This includes oil or grease spills. What can I do in the bathroom?  Use night lights.  Install grab bars by the toilet and in the tub and shower. Do not use towel bars as grab bars.  Use non-skid mats or decals in the tub or shower.  If you need to sit down in the shower, use a plastic, non-slip stool.  Keep the floor dry. Clean up any water that spills on the floor as soon as it happens.  Remove soap buildup in the tub or shower regularly.  Attach bath mats securely with double-sided non-slip rug tape.  Do not have throw rugs and other things on the floor that can make you trip. What can I do in the bedroom?  Use night lights.  Make sure that you have a light by your bed that is easy to reach.  Do not use any sheets or blankets that are too big for your bed. They should not hang down onto the floor.  Have a firm chair that has side arms. You can use this for support while you get dressed.  Do not have throw rugs and other things on the floor that can  make you trip. What can I do in the kitchen?  Clean up any spills right away.  Avoid walking on wet floors.  Keep items that you use a lot in easy-to-reach places.  If you need to reach something above you, use a strong step stool that has a grab bar.  Keep electrical cords out of the way.  Do not use floor polish or wax that makes floors slippery. If you must use wax, use non-skid floor wax.  Do not have throw rugs and other things on the floor that can make you trip. What can I do with my stairs?  Do not leave any items on the stairs.  Make sure that there are handrails on both sides of the stairs and use them. Fix handrails that are broken or loose. Make sure that handrails are as long as the stairways.  Check any carpeting to make sure that it is firmly attached to the stairs. Fix any carpet that is loose or worn.  Avoid having throw rugs at the top or bottom of the stairs. If you do have throw  rugs, attach them to the floor with carpet tape.  Make sure that you have a light switch at the top of the stairs and the bottom of the stairs. If you do not have them, ask someone to add them for you. What else can I do to help prevent falls?  Wear shoes that:  Do not have high heels.  Have rubber bottoms.  Are comfortable and fit you well.  Are closed at the toe. Do not wear sandals.  If you use a stepladder:  Make sure that it is fully opened. Do not climb a closed stepladder.  Make sure that both sides of the stepladder are locked into place.  Ask someone to hold it for you, if possible.  Clearly mark and make sure that you can see:  Any grab bars or handrails.  First and last steps.  Where the edge of each step is.  Use tools that help you move around (mobility aids) if they are needed. These include:  Canes.  Walkers.  Scooters.  Crutches.  Turn on the lights when you go into a dark area. Replace any light bulbs as soon as they burn out.  Set up your furniture so you have a clear path. Avoid moving your furniture around.  If any of your floors are uneven, fix them.  If there are any pets around you, be aware of where they are.  Review your medicines with your doctor. Some medicines can make you feel dizzy. This can increase your chance of falling. Ask your doctor what other things that you can do to help prevent falls. This information is not intended to replace advice given to you by your health care provider. Make sure you discuss any questions you have with your health care provider. Document Released: 06/07/2009 Document Revised: 01/17/2016 Document Reviewed: 09/15/2014 Elsevier Interactive Patient Education  2017 [employer].
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
## 2017-09-18 LAB — LIPID PANEL
CHOLESTEROL TOTAL: 175 mg/dL (ref 100–199)
Chol/HDL Ratio: 3 ratio (ref 0.0–4.4)
HDL: 58 mg/dL (ref >39)
LDL CALC: 94 mg/dL (ref 0–99)
Triglycerides: 116 mg/dL (ref 0–149)
VLDL Cholesterol Cal: 23 mg/dL (ref 5–40)

## 2017-09-18 LAB — CBC WITH DIFFERENTIAL/PLATELET
BASOS: 0 %
Basophils Absolute: 0 10*3/uL (ref 0.0–0.2)
EOS (ABSOLUTE): 0.1 10*3/uL (ref 0.0–0.4)
EOS: 1 %
HEMATOCRIT: 48.1 % — AB (ref 34.0–46.6)
HEMOGLOBIN: 16.4 g/dL — AB (ref 11.1–15.9)
Immature Grans (Abs): 0 10*3/uL (ref 0.0–0.1)
Immature Granulocytes: 0 %
LYMPHS ABS: 2.8 10*3/uL (ref 0.7–3.1)
Lymphs: 33 %
MCH: 30.9 pg (ref 26.6–33.0)
MCHC: 34.1 g/dL (ref 31.5–35.7)
MCV: 91 fL (ref 79–97)
MONOCYTES: 5 %
MONOS ABS: 0.4 10*3/uL (ref 0.1–0.9)
Neutrophils Absolute: 5 10*3/uL (ref 1.4–7.0)
Neutrophils: 61 %
Platelets: 290 10*3/uL (ref 150–379)
RBC: 5.31 x10E6/uL — ABNORMAL HIGH (ref 3.77–5.28)
RDW: 15.3 % (ref 12.3–15.4)
WBC: 8.4 10*3/uL (ref 3.4–10.8)

## 2017-09-18 LAB — COMPREHENSIVE METABOLIC PANEL
ALBUMIN: 4.3 g/dL (ref 3.6–4.8)
ALT: 18 IU/L (ref 0–32)
AST: 21 IU/L (ref 0–40)
Albumin/Globulin Ratio: 1.2 (ref 1.2–2.2)
Alkaline Phosphatase: 97 IU/L (ref 39–117)
BUN/Creatinine Ratio: 10 — ABNORMAL LOW (ref 12–28)
BUN: 10 mg/dL (ref 8–27)
Bilirubin Total: 0.6 mg/dL (ref 0.0–1.2)
CO2: 28 mmol/L (ref 20–29)
CREATININE: 1.01 mg/dL — AB (ref 0.57–1.00)
Calcium: 10.2 mg/dL (ref 8.7–10.3)
Chloride: 98 mmol/L (ref 96–106)
GFR, EST AFRICAN AMERICAN: 66 mL/min/{1.73_m2} (ref >59)
GFR, EST NON AFRICAN AMERICAN: 57 mL/min/{1.73_m2} — AB (ref >59)
GLOBULIN, TOTAL: 3.6 g/dL (ref 1.5–4.5)
Glucose: 87 mg/dL (ref 65–99)
Potassium: 3.5 mmol/L (ref 3.5–5.2)
SODIUM: 145 mmol/L — AB (ref 134–144)
TOTAL PROTEIN: 7.9 g/dL (ref 6.0–8.5)

## 2017-09-21 ENCOUNTER — Encounter: Payer: Self-pay | Admitting: Family Medicine

## 2017-09-21 ENCOUNTER — Ambulatory Visit (INDEPENDENT_AMBULATORY_CARE_PROVIDER_SITE_OTHER): Payer: Medicare Other | Admitting: Family Medicine

## 2017-09-21 ENCOUNTER — Other Ambulatory Visit: Payer: Self-pay

## 2017-09-21 ENCOUNTER — Ambulatory Visit (INDEPENDENT_AMBULATORY_CARE_PROVIDER_SITE_OTHER): Payer: Medicare Other

## 2017-09-21 VITALS — BP 128/78 | HR 113 | Temp 98.0°F | Resp 16 | Ht 66.54 in | Wt 182.0 lb

## 2017-09-21 DIAGNOSIS — J410 Simple chronic bronchitis: Secondary | ICD-10-CM

## 2017-09-21 DIAGNOSIS — J302 Other seasonal allergic rhinitis: Secondary | ICD-10-CM

## 2017-09-21 DIAGNOSIS — J441 Chronic obstructive pulmonary disease with (acute) exacerbation: Secondary | ICD-10-CM | POA: Diagnosis not present

## 2017-09-21 DIAGNOSIS — R0602 Shortness of breath: Secondary | ICD-10-CM | POA: Diagnosis not present

## 2017-09-21 DIAGNOSIS — I10 Essential (primary) hypertension: Secondary | ICD-10-CM

## 2017-09-21 DIAGNOSIS — J0100 Acute maxillary sinusitis, unspecified: Secondary | ICD-10-CM

## 2017-09-21 DIAGNOSIS — R7302 Impaired glucose tolerance (oral): Secondary | ICD-10-CM

## 2017-09-21 DIAGNOSIS — J9801 Acute bronchospasm: Secondary | ICD-10-CM

## 2017-09-21 DIAGNOSIS — R05 Cough: Secondary | ICD-10-CM | POA: Diagnosis not present

## 2017-09-21 MED ORDER — ALBUTEROL SULFATE HFA 108 (90 BASE) MCG/ACT IN AERS: 2.0000 | INHALATION_SPRAY | RESPIRATORY_TRACT | 1 refills | Status: DC | PRN

## 2017-09-21 MED ORDER — FLUTICASONE PROPIONATE 50 MCG/ACT NA SUSP: 2.0000 | Freq: Every day | NASAL | 3 refills | Status: DC

## 2017-09-21 MED ORDER — POTASSIUM CHLORIDE CRYS ER 20 MEQ PO TBCR: 20.0000 meq | EXTENDED_RELEASE_TABLET | Freq: Every day | ORAL | 3 refills | Status: DC

## 2017-09-21 MED ORDER — PREDNISONE 20 MG PO TABS: ORAL_TABLET | ORAL | 0 refills | Status: DC

## 2017-09-21 MED ORDER — AMOXICILLIN-POT CLAVULANATE 875-125 MG PO TABS: 1.0000 | ORAL_TABLET | Freq: Two times a day (BID) | ORAL | 0 refills | Status: DC

## 2017-09-21 MED ORDER — HYDROCHLOROTHIAZIDE 25 MG PO TABS: 25.0000 mg | ORAL_TABLET | Freq: Every day | ORAL | 3 refills | Status: DC

## 2017-09-21 MED ORDER — ALBUTEROL SULFATE (2.5 MG/3ML) 0.083% IN NEBU
2.5000 mg | INHALATION_SOLUTION | Freq: Once | RESPIRATORY_TRACT | Status: AC
  Administered 2017-09-21: 2.5 mg via RESPIRATORY_TRACT

## 2017-09-21 MED ORDER — TIOTROPIUM BROMIDE MONOHYDRATE 18 MCG IN CAPS: ORAL_CAPSULE | RESPIRATORY_TRACT | 11 refills | Status: DC

## 2017-09-21 NOTE — Patient Instructions (Addendum)
  Restart Flonase nasal spray. Restart Loratadine 10mg  daily. Use Albuterol inhaler four times daily for the next week and then as needed.   IF you received an x-ray today, you will receive an invoice from [facility]. Please contact [facility] at [phone] with questions or concerns regarding your invoice.   IF you received labwork today, you will receive an invoice from [facility]. Please contact [facility] at [phone] with questions or concerns regarding your invoice.   Our billing staff will not be able to assist you with questions regarding bills from these companies.  You will be contacted with the lab results as soon as they are available. The fastest way to get your results is to activate your My Chart account. Instructions are located on the last page of this paperwork. If you have not heard from [name] regarding the results in 2 weeks, please contact this office.
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
## 2017-09-22 ENCOUNTER — Encounter: Payer: Medicare Other | Admitting: Family Medicine

## 2017-09-24 ENCOUNTER — Encounter: Payer: Self-pay | Admitting: Family Medicine

## 2017-10-12 ENCOUNTER — Encounter: Payer: Self-pay | Admitting: Family Medicine

## 2017-10-12 ENCOUNTER — Ambulatory Visit (INDEPENDENT_AMBULATORY_CARE_PROVIDER_SITE_OTHER): Payer: Medicare Other | Admitting: Family Medicine

## 2017-10-12 ENCOUNTER — Other Ambulatory Visit: Payer: Self-pay

## 2017-10-12 VITALS — BP 122/82 | HR 111 | Temp 98.0°F | Resp 16 | Ht 66.93 in | Wt 183.0 lb

## 2017-10-12 DIAGNOSIS — J441 Chronic obstructive pulmonary disease with (acute) exacerbation: Secondary | ICD-10-CM | POA: Diagnosis not present

## 2017-10-12 DIAGNOSIS — J302 Other seasonal allergic rhinitis: Secondary | ICD-10-CM | POA: Diagnosis not present

## 2017-10-12 DIAGNOSIS — J849 Interstitial pulmonary disease, unspecified: Secondary | ICD-10-CM

## 2017-10-12 DIAGNOSIS — J410 Simple chronic bronchitis: Secondary | ICD-10-CM | POA: Diagnosis not present

## 2017-10-12 DIAGNOSIS — J9601 Acute respiratory failure with hypoxia: Secondary | ICD-10-CM | POA: Diagnosis not present

## 2017-10-12 MED ORDER — BUDESONIDE-FORMOTEROL FUMARATE 160-4.5 MCG/ACT IN AERO: 2.0000 | INHALATION_SPRAY | Freq: Two times a day (BID) | RESPIRATORY_TRACT | 12 refills | Status: DC

## 2017-10-12 MED ORDER — PREDNISONE 20 MG PO TABS: ORAL_TABLET | ORAL | 0 refills | Status: DC
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
## 2017-10-12 NOTE — Patient Instructions (Addendum)
IF you received an x-ray today, you will receive an invoice from [facility]. Please contact [facility] at [phone] with questions or concerns regarding your invoice.   IF you received labwork today, you will receive an invoice from [facility]. Please contact [facility] at [phone] with questions or concerns regarding your invoice.   Our billing staff will not be able to assist you with questions regarding bills from these companies.  You will be contacted with the lab results as soon as they are available. The fastest way to get your results is to activate your My Chart account. Instructions are located on the last page of this paperwork. If you have not heard from [name] regarding the results in 2 weeks, please contact this office.     c Chronic Obstructive Pulmonary Disease Chronic obstructive pulmonary disease (COPD) is a long-term (chronic) lung problem. When you have COPD, it is hard for air to get in and out of your lungs. The way your lungs work will never return to normal. Usually the condition gets worse over time. There are things you can do to keep yourself as healthy as possible. Your doctor may treat your condition with:  Medicines.  Quitting smoking, if you smoke.  Rehabilitation. This may involve a team of specialists.  Oxygen.  Exercise and changes to your diet.  Lung surgery.  Comfort measures (palliative care).  Follow these instructions at home: Medicines  Take over-the-counter and prescription medicines only as told by your doctor.  Talk to your doctor before taking any cough or allergy medicines. You may need to avoid medicines that cause your lungs to be dry. Lifestyle  If you smoke, stop. Smoking makes the problem worse. If you need help quitting, ask your doctor.  Avoid being around things that make your breathing worse. This may include smoke, chemicals, and fumes.  Stay active, but remember to also rest.  Learn and use  tips on how to relax.  Make sure you get enough sleep. Most adults need at least 7 hours a night.  Eat healthy foods. Eat smaller meals more often. Rest before meals. Controlled breathing  Learn and use tips on how to control your breathing as told by your doctor. Try: ? Breathing in (inhaling) through your nose for 1 second. Then, pucker your lips and breath out (exhale) through your lips for 2 seconds. ? Putting one hand on your belly (abdomen). Breathe in slowly through your nose for 1 second. Your hand on your belly should move out. Pucker your lips and breathe out slowly through your lips. Your hand on your belly should move in as you breathe out. Controlled coughing  Learn and use controlled coughing to clear mucus from your lungs. The steps are: 1. Lean your head a little forward. 2. Breathe in deeply. 3. Try to hold your breath for 3 seconds. 4. Keep your mouth slightly open while coughing 2 times. 5. Spit any mucus out into a tissue. 6. Rest and do the steps again 1 or 2 times as needed. General instructions  Make sure you get all the shots (vaccines) that your doctor recommends. Ask your doctor about a flu shot and a pneumonia shot.  Use oxygen therapy and therapy to help improve your lungs (pulmonary rehabilitation) if told by your doctor. If you need home oxygen therapy, ask your doctor if you should buy a tool to measure your oxygen level (oximeter).  Make a COPD action plan with your doctor. This helps you  know what to do if you feel worse than usual.  Manage any other conditions you have as told by your doctor.  Avoid going outside when it is very hot, cold, or humid.  Avoid people who have a sickness you can catch (contagious).  Keep all follow-up visits as told by your doctor. This is important. Contact a doctor if:  You cough up more mucus than usual.  There is a change in the color or thickness of the mucus.  It is harder to breathe than usual.  Your  breathing is faster than usual.  You have trouble sleeping.  You need to use your medicines more often than usual.  You have trouble doing your normal activities such as getting dressed or walking around the house. Get help right away if:  You have shortness of breath while resting.  You have shortness of breath that stops you from: ? Being able to talk. ? Doing normal activities.  Your chest hurts for longer than 5 minutes.  Your skin color is more blue than usual.  Your pulse oximeter shows that you have low oxygen for longer than 5 minutes.  You have a fever.  You feel too tired to breathe normally. Summary  Chronic obstructive pulmonary disease (COPD) is a long-term lung problem.  The way your lungs work will never return to normal. Usually the condition gets worse over time. There are things you can do to keep yourself as healthy as possible.  Take over-the-counter and prescription medicines only as told by your doctor.  If you smoke, stop. Smoking makes the problem worse. This information is not intended to replace advice given to you by your health care provider. Make sure you discuss any questions you have with your health care provider. Document Released: 01/28/2008 Document Revised: 01/17/2016 Document Reviewed: 04/07/2013 Elsevier Interactive Patient Education  2017 [employer].

## 2017-10-13 ENCOUNTER — Encounter: Payer: Self-pay | Admitting: Family Medicine

## 2017-10-25 ENCOUNTER — Telehealth: Payer: Self-pay | Admitting: Family Medicine

## 2017-10-25 NOTE — Telephone Encounter (Signed)
Referrals --- pt is scheduled for Ct angio on 11/16/17; this is too far out; please see if can be scheduled this week.

## 2017-10-26 NOTE — Progress Notes (Signed)
Scheduled appt with pt on 11/18/17 at 8:20 am with Dr. Tamala Julian  Thanks - Lavella Lemons

## 2017-10-27 NOTE — Telephone Encounter (Signed)
Hey Dr. Tamala Julian it looks like they moved her appt for the ct scan to 11/06/17 is that okay or would you like me to see if they can move it up sooner?

## 2017-10-31 NOTE — Telephone Encounter (Signed)
I would really like it sooner.

## 2017-11-02 NOTE — Telephone Encounter (Signed)
Called [facility] to see if pt could get a sooner appt for CT scan they stated that could get pt in tomorrow November 03, 2017 at 2:40 with an arrival time at 2:20 made pt that appt and called pt to let her know she was fine with that time.Marland Kitchen

## 2017-11-03 ENCOUNTER — Ambulatory Visit
Admission: RE | Admit: 2017-11-03 | Discharge: 2017-11-03 | Disposition: A | Discharge status: Home / Self Care | Payer: Medicare Other | Source: Ambulatory Visit | Attending: Family Medicine | Admitting: Family Medicine

## 2017-11-03 DIAGNOSIS — R0602 Shortness of breath: Secondary | ICD-10-CM | POA: Diagnosis not present

## 2017-11-03 DIAGNOSIS — J849 Interstitial pulmonary disease, unspecified: Secondary | ICD-10-CM

## 2017-11-03 DIAGNOSIS — R05 Cough: Secondary | ICD-10-CM | POA: Diagnosis not present

## 2017-11-03 MED ORDER — IOPAMIDOL (ISOVUE-370) INJECTION 76%
75.0000 mL | Freq: Once | INTRAVENOUS | Status: AC | PRN
  Administered 2017-11-03: 75 mL via INTRAVENOUS

## 2017-11-06 ENCOUNTER — Other Ambulatory Visit: Payer: Medicare Other

## 2017-11-18 ENCOUNTER — Ambulatory Visit (INDEPENDENT_AMBULATORY_CARE_PROVIDER_SITE_OTHER): Payer: Medicare Other | Admitting: Family Medicine

## 2017-11-18 ENCOUNTER — Encounter: Payer: Self-pay | Admitting: Family Medicine

## 2017-11-18 ENCOUNTER — Other Ambulatory Visit: Payer: Self-pay

## 2017-11-18 VITALS — BP 138/72 | HR 111 | Temp 99.0°F | Resp 16 | Ht 66.73 in | Wt 183.0 lb

## 2017-11-18 DIAGNOSIS — R0902 Hypoxemia: Secondary | ICD-10-CM | POA: Diagnosis not present

## 2017-11-18 DIAGNOSIS — J029 Acute pharyngitis, unspecified: Secondary | ICD-10-CM

## 2017-11-18 DIAGNOSIS — J410 Simple chronic bronchitis: Secondary | ICD-10-CM

## 2017-11-18 DIAGNOSIS — I10 Essential (primary) hypertension: Secondary | ICD-10-CM

## 2017-11-18 DIAGNOSIS — K219 Gastro-esophageal reflux disease without esophagitis: Secondary | ICD-10-CM | POA: Diagnosis not present

## 2017-11-18 DIAGNOSIS — N281 Cyst of kidney, acquired: Secondary | ICD-10-CM

## 2017-11-18 LAB — POCT RAPID STREP A (OFFICE): Rapid Strep A Screen: NEGATIVE

## 2017-11-18 MED ORDER — OMEPRAZOLE 20 MG PO CPDR: 20.0000 mg | DELAYED_RELEASE_CAPSULE | Freq: Every day | ORAL | 3 refills | Status: DC

## 2017-11-18 NOTE — Patient Instructions (Signed)
     IF you received an x-ray today, you will receive an invoice from [facility]. Please contact  Radiology at [phone] with questions or concerns regarding your invoice.   IF you received labwork today, you will receive an invoice from [facility]. Please contact [facility] at [phone] with questions or concerns regarding your invoice.   Our billing staff will not be able to assist you with questions regarding bills from these companies.  You will be contacted with the lab results as soon as they are available. The fastest way to get your results is to activate your My Chart account. Instructions are located on the last page of this paperwork. If you have not heard from us regarding the results in 2 weeks, please contact this office.     
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
## 2017-11-20 LAB — CULTURE, GROUP A STREP: Strep A Culture: NEGATIVE

## 2017-11-24 ENCOUNTER — Other Ambulatory Visit: Payer: Self-pay | Admitting: Family Medicine

## 2017-11-24 DIAGNOSIS — I10 Essential (primary) hypertension: Secondary | ICD-10-CM

## 2017-12-18 ENCOUNTER — Encounter: Payer: Self-pay | Admitting: Internal Medicine

## 2017-12-18 ENCOUNTER — Ambulatory Visit (INDEPENDENT_AMBULATORY_CARE_PROVIDER_SITE_OTHER): Payer: Medicare Other | Admitting: Internal Medicine

## 2017-12-18 VITALS — BP 130/78 | HR 94 | Ht 65.0 in | Wt 183.0 lb

## 2017-12-18 DIAGNOSIS — R05 Cough: Secondary | ICD-10-CM | POA: Diagnosis not present

## 2017-12-18 DIAGNOSIS — R058 Other specified cough: Secondary | ICD-10-CM

## 2017-12-18 DIAGNOSIS — J449 Chronic obstructive pulmonary disease, unspecified: Secondary | ICD-10-CM | POA: Diagnosis not present

## 2017-12-18 MED ORDER — TIOTROPIUM BROMIDE-OLODATEROL 2.5-2.5 MCG/ACT IN AERS: 2.0000 | INHALATION_SPRAY | Freq: Every day | RESPIRATORY_TRACT | 11 refills | Status: DC

## 2017-12-18 NOTE — Patient Instructions (Signed)
Stop spiriva and symbicort   Plan A = Automatic = stiolto 2 pffs each am  And continue the omeprazole Take 30-60 min before first meal of the day   Work on inhaler technique:  relax and gently blow all the way out then take a nice smooth deep breath back in, triggering the inhaler at same time you start breathing in.  Hold for up to 5 seconds if you can.  . Rinse and gargle with water when done      Plan B = Backup Only use your albuterol as a rescue medication to be used if you can't catch your breath by resting or doing a relaxed purse lip breathing pattern.  - The less you use it, the better it will work when you need it. - Ok to use the inhaler up to 2 puffs  every 4 hours if you must but call for appointment if use goes up over your usual need - Don't leave home without it !!  (think of it like the spare tire for your car)      GERD (REFLUX)  is an extremely common cause of respiratory symptoms just like yours , many times with no obvious heartburn at all.    It can be treated with medication, but also with lifestyle changes including elevation of the head of your bed (ideally with 6 inch  bed blocks),  Smoking cessation, avoidance of late meals, excessive alcohol, and avoid fatty foods, chocolate, peppermint, colas, red wine, and acidic juices such as orange juice.  NO MINT OR MENTHOL PRODUCTS SO NO COUGH DROPS   USE SUGARLESS CANDY INSTEAD (Jolley ranchers or Stover's or Life Savers) or even ice chips will also do - the key is to swallow to prevent all throat clearing. NO OIL BASED VITAMINS - use powdered substitutes.        Please schedule a follow up office visit in 6 weeks, call sooner if needed with all medications /inhalers/ solutions in hand so we can verify exactly what you are taking. This includes all medications from all doctors and over the counters  - pfts on return

## 2017-12-18 NOTE — Progress Notes (Signed)
Subjective:     Patient ID: Nicole Spence, female   DOB: 04-14-1946,    MRN: [id number]  HPI  54 yobf  Quit smoking 2015 noted onset of doe age [age] with sense of nasal drainage/ sore throat and eventually placed on spiriva then added symbicort but actually much better since started reflux meds early April 2019 and referred to pulmonary clinic 12/18/2017 by Dr   Reginia Forts p ent eval around 2017 pos gerd but had stopped PPI due to reported potential side effects in media .   12/18/2017 1st [facility] office visit/ Nicole Spence   Chief Complaint  Patient presents with  . Pulmonary Consult    Referred by Dr. Windell Moment for eval of COPD. Pt states she was told years ago she had chronic bronchitis "but it never really bothered me". She has SOB with exertion such as cleaning her house or washing her car. She has an albuterol inhaler that she rarely uses.   doe indolent onset minimal progressive since age 25  Esp carrying groceries in from car has trouble with steps and walks slower than avg people never tries saba [facility] = can't walk a nl pace on a flat grade s sob but does fine slow and flat  Takes  symbicort x 2 first thing am/ then spriva lunchtime and then symb 160   Cough better on otc gerd rx  Sleep ok    No obvious day to day or daytime variability or assoc excess/ purulent sputum or mucus plugs or hemoptysis or cp or chest tightness, subjective wheeze or overt sinus or hb symptoms. No unusual exposure hx or h/o childhood pna/ asthma or knowledge of premature birth.  Sleeping ok flat without nocturnal  or early am exacerbation  of respiratory  c/o's or need for noct saba. Also denies any obvious fluctuation of symptoms with weather or environmental changes or other aggravating or alleviating factors except as outlined above   Current Allergies, Complete Past Medical History, Past Surgical History, Family History, and Social History were reviewed in [employer]  record.  ROS  The following are not active complaints unless bolded Hoarseness, sore throat, dysphagia, dental problems, itching, sneezing,  nasal congestion or discharge of excess mucus or purulent secretions, ear ache,   fever, chills, sweats, unintended wt loss or wt gain, classically pleuritic or exertional cp,  orthopnea pnd or leg swelling, presyncope, palpitations, abdominal pain, anorexia, nausea, vomiting, diarrhea  or change in bowel habits or change in bladder habits, change in stools or change in urine, dysuria, hematuria,  rash, arthralgias, visual complaints, headache, numbness, weakness or ataxia or problems with walking or coordination,  change in mood/affect or memory.        Current Meds  Medication Sig  . albuterol (PROVENTIL HFA;VENTOLIN HFA) 108 (90 Base) MCG/ACT inhaler Inhale 2 puffs into the lungs every 4 (four) hours as needed for wheezing or shortness of breath (cough, shortness of breath or wheezing.).  Marland Kitchen budesonide-formoterol (SYMBICORT) 160-4.5 MCG/ACT inhaler Inhale 2 puffs into the lungs 2 (two) times daily.  . fluticasone (FLONASE) 50 MCG/ACT nasal spray USE 2 SPRAYS IN EACH  NOSTRIL DAILY  . hydrochlorothiazide (HYDRODIURIL) 25 MG tablet Take 1 tablet (25 mg total) by mouth daily.  Marland Kitchen loratadine (CLARITIN) 10 MG tablet Take 1 tablet (10 mg total) by mouth daily.  . Multiple Vitamins-Minerals (WOMENS 50+ MULTI VITAMIN/MIN) TABS Take by mouth daily.  Marland Kitchen omeprazole (PRILOSEC) 20 MG capsule Take 1 capsule (20 mg  total) by mouth daily.  . potassium chloride SA (K-DUR,KLOR-CON) 20 MEQ tablet Take 1 tablet (20 mEq total) by mouth daily.  Marland Kitchen tiotropium (SPIRIVA HANDIHALER) 18 MCG inhalation capsule PLACE ONE CAPSULE INTO INHALER AND INHALE BY MOUTH ONCE DAILY             Review of Systems     Objective:   Physical Exam    amb bf with mild voice fatigue    Wt Readings from Last 3 Encounters:  12/18/17 183 lb (83 kg)  11/18/17 183 lb (83 kg)  10/12/17 183 lb  (83 kg)     Vital signs reviewed - Note on arrival 02 sats  97% on RA       HEENT: nl dentition, turbinates bilaterally, and oropharynx. Nl external ear canals without cough reflex   NECK :  without JVD/Nodes/TM/ nl carotid upstrokes bilaterally   LUNGS: no acc muscle use,  Nl contour chest which is clear to A and P bilaterally without cough on insp or exp maneuvers   CV:  RRR  no s3 or murmur or increase in P2, and no edema   ABD:  soft and nontender with nl inspiratory excursion in the supine position. No bruits or organomegaly appreciated, bowel sounds nl  MS:  Nl gait/ ext warm without deformities, calf tenderness, cyanosis or clubbing No obvious joint restrictions   SKIN: warm and dry without lesions    NEURO:  alert, approp, nl sensorium with  no motor or cerebellar deficits apparent.       I personally reviewed images and agree with radiology impression as follows:   Chest CTa 11/03/17  Emphysematous changes are noted. Mild fibrotic changes are seen within the left upper lobe. No focal infiltrate or sizable effusion is seen. No parenchymal nodule is noted.  Assessment:

## 2017-12-20 ENCOUNTER — Encounter: Payer: Self-pay | Admitting: Internal Medicine

## 2017-12-20 DIAGNOSIS — R058 Other specified cough: Secondary | ICD-10-CM | POA: Insufficient documentation

## 2017-12-20 DIAGNOSIS — R05 Cough: Secondary | ICD-10-CM | POA: Insufficient documentation

## 2017-12-20 NOTE — Assessment & Plan Note (Addendum)
Quit smoking 2015 - 12/18/2017  Walked RA x 3 laps @ 185 ft each stopped due to  End of study, fast pace, sats 90% at end  -  Spirometry 12/18/2017  FEV1 1.63 (83%)  Ratio 65 - 12/18/2017  After extensive coaching inhaler device  effectiveness =    75% with smi > changed from  symbicort /spiriva dpi to stiolto  Pt is Group B in terms of symptom/risk and laba/lama therefore appropriate rx at this point and note symptoms much better back on gerd rx which for now she she should continue plus pay close attention to diet   Should she flare on this regimen would change back to just symbicort but use the 80 2bid version due to concerns that she may have uacs (see sep a/p)  F/u will be in 6 weeks with full pfts    Total time devoted to counseling  > 50 % of initial 60 min office visit:  review case with pt/ discussion of options/alternatives/ personally creating written customized instructions  in presence of pt  then going over those specific  Instructions directly with the pt including how to use all of the meds but in particular covering each new medication in detail and the difference between the maintenance= "automatic" meds and the prns using an action plan format for the latter (If this problem/symptom => do that organization reading Left to right).  Please see AVS from this visit for a full list of these instructions which I personally wrote for this pt and  are unique to this visit.

## 2017-12-20 NOTE — Assessment & Plan Note (Signed)
ent eval around 2017 c/w GERD and worse off gerd rx - 12/18/2017  reported improved p ppi restarted otc  Upper airway cough syndrome (previously labeled PNDS),  is so named because it's frequently impossible to sort out how much is  CR/sinusitis with freq throat clearing (which can be related to primary GERD)   vs  causing  secondary (" extra esophageal")  GERD from wide swings in gastric pressure that occur with throat clearing, often  promoting self use of mint and menthol lozenges that reduce the lower esophageal sphincter tone and exacerbate the problem further in a cyclical fashion.   These are the same pts (now being labeled as having "irritable larynx syndrome" by some cough centers) who not infrequently have a history of having failed to tolerate ace inhibitors,  dry powder inhalers(like spiriva handihaler which she she stop)  or biphosphonates or report having atypical/extraesophageal reflux symptoms that don't respond to standard doses of PPI  and are easily confused as having aecopd or asthma flares by even experienced allergists/ pulmonologists (myself included).    Reviewed: Discussed the recent press about ppi's in the context of a statistically significant (but questionably clinically relevant) increase in CRI in pts on ppi vs h2's > bottom line is the lowest dose of ppi that controls   gerd is the right dose and if that dose is zero that's fine esp since h2's are cheaper.    for now would use ppi otc ac and if can control symptoms well on this plus diet then try transition to h2's

## 2017-12-31 ENCOUNTER — Ambulatory Visit
Admission: RE | Admit: 2017-12-31 | Discharge: 2017-12-31 | Disposition: A | Discharge status: Home / Self Care | Payer: Medicare Other | Source: Ambulatory Visit | Attending: Family Medicine | Admitting: Family Medicine

## 2017-12-31 DIAGNOSIS — N281 Cyst of kidney, acquired: Secondary | ICD-10-CM | POA: Diagnosis not present

## 2018-01-19 ENCOUNTER — Encounter: Payer: Self-pay | Admitting: Family Medicine

## 2018-01-28 ENCOUNTER — Other Ambulatory Visit: Payer: Self-pay | Admitting: Internal Medicine

## 2018-01-28 ENCOUNTER — Encounter: Payer: Self-pay | Admitting: Physician Assistant

## 2018-01-28 ENCOUNTER — Other Ambulatory Visit: Payer: Self-pay

## 2018-01-28 ENCOUNTER — Ambulatory Visit (INDEPENDENT_AMBULATORY_CARE_PROVIDER_SITE_OTHER): Payer: Medicare Other

## 2018-01-28 ENCOUNTER — Ambulatory Visit (INDEPENDENT_AMBULATORY_CARE_PROVIDER_SITE_OTHER): Payer: Medicare Other | Admitting: Physician Assistant

## 2018-01-28 VITALS — BP 112/72 | HR 108 | Temp 99.5°F | Resp 16 | Ht 65.0 in | Wt 184.6 lb

## 2018-01-28 DIAGNOSIS — M7989 Other specified soft tissue disorders: Secondary | ICD-10-CM | POA: Diagnosis not present

## 2018-01-28 DIAGNOSIS — M79675 Pain in left toe(s): Secondary | ICD-10-CM

## 2018-01-28 DIAGNOSIS — S92512A Displaced fracture of proximal phalanx of left lesser toe(s), initial encounter for closed fracture: Secondary | ICD-10-CM

## 2018-01-28 DIAGNOSIS — R0600 Dyspnea, unspecified: Secondary | ICD-10-CM

## 2018-01-28 NOTE — Patient Instructions (Addendum)
Rest, Ice and Elevation    IF you received an x-ray today, you will receive an invoice from [facility]. Please contact [facility] at [phone] with questions or concerns regarding your invoice.   IF you received labwork today, you will receive an invoice from [facility]. Please contact [facility] at [phone] with questions or concerns regarding your invoice.   Our billing staff will not be able to assist you with questions regarding bills from these companies.  You will be contacted with the lab results as soon as they are available. The fastest way to get your results is to activate your My Chart account. Instructions are located on the last page of this paperwork. If you have not heard from [name] regarding the results in 2 weeks, please contact this office.

## 2018-01-28 NOTE — Progress Notes (Signed)
Patient ID: Nicole Spence, female    DOB: May 24, 1946, 72 y.o.   MRN: [id number]  PCP: Wardell Honour, MD  Chief Complaint  Patient presents with  . Fall    hit foot yesterday on a child walker, swollen and painful    Subjective:   Presents for evaluation of LEFT foot pain.  Yesterday she was stepping over a box and accidentally kicked a child walker, injuring the 4th toe. Overnight, she developed increased swelling and continues to have pain.  Pain is worse with standing and walking.  She finds that walking on the heel and side of her foot helps a little.  Review of Systems As above    Patient Active Problem List   Diagnosis Date Noted  . Upper airway cough syndrome 12/20/2017  . Other seasonal allergic rhinitis 10/26/2014  . Physical deconditioning 10/26/2014  . Glucose intolerance (impaired glucose tolerance) 10/25/2014  . Obesity (BMI 30.0-34.9) 10/25/2014  . Essential hypertension 07/26/2014  . COPD GOLD I 07/26/2014     Prior to Admission medications   Medication Sig Start Date End Date Taking? Authorizing Provider  albuterol (PROVENTIL HFA;VENTOLIN HFA) 108 (90 Base) MCG/ACT inhaler Inhale 2 puffs into the lungs every 4 (four) hours as needed for wheezing or shortness of breath (cough, shortness of breath or wheezing.). 09/21/17  Yes Wardell Honour, MD  fluticasone [facility]) 50 MCG/ACT nasal spray USE 2 SPRAYS IN [facility]  NOSTRIL DAILY 11/24/17  Yes Wardell Honour, MD  hydrochlorothiazide (HYDRODIURIL) 25 MG tablet Take 1 tablet (25 mg total) by mouth daily. 09/21/17  Yes Wardell Honour, MD  loratadine (CLARITIN) 10 MG tablet Take 1 tablet (10 mg total) by mouth daily. 03/17/17  Yes Wardell Honour, MD  Multiple Vitamins-Minerals (WOMENS 50+ MULTI VITAMIN/MIN) TABS Take by mouth daily.   Yes [provider]  omeprazole (PRILOSEC) 20 MG capsule Take 1 capsule (20 mg total) by mouth daily. 11/18/17  Yes Wardell Honour, MD  potassium chloride SA  (K-DUR,KLOR-CON) 20 MEQ tablet Take 1 tablet (20 mEq total) by mouth daily. 09/21/17  Yes Wardell Honour, MD  Tiotropium Bromide-Olodaterol (STIOLTO RESPIMAT) 2.5-2.5 MCG/ACT AERS Inhale 2 puffs into the lungs daily. 12/18/17  Yes Tanda Rockers, MD     Allergies  Allergen Reactions  . Adhesive [Tape] Other (See Comments)    IRRITATES SKIN        Objective:  Physical Exam  Constitutional: She is oriented to person, place, and time. She appears well-developed and well-nourished. She is active and cooperative. No distress.  BP 112/72   Pulse (!) 108   Temp 99.5 F (37.5 C)   Resp 16   Ht 5\' 5"  (1.651 m)   Wt 184 lb 9.6 oz (83.7 kg)   SpO2 94%   BMI 30.72 kg/m    Eyes: Conjunctivae are normal.  Pulmonary/Chest: Effort normal.  Musculoskeletal:       Left ankle: Normal. Achilles tendon normal.       Left foot: There is decreased range of motion (3rd-5th toes), tenderness, bony tenderness and swelling. There is normal capillary refill, no crepitus, no deformity and no laceration.       Feet:  Neurological: She is alert and oriented to person, place, and time.  Psychiatric: She has a normal mood and affect. Her speech is normal and behavior is normal.    Dg Toe 4th Left  Result Date: 01/28/2018 CLINICAL DATA:  Accidentally kicked an object yesterday, LEFT  fourth toe pain EXAM: LEFT FOURTH TOE COMPARISON:  None FINDINGS: Osseous mineralization appears diffusely decreased. Joint spaces preserved. Minimally displaced oblique fracture at diaphysis of proximal phalanx fourth toe. No additional fracture, dislocation, or bone destruction. IMPRESSION: Minimally displaced oblique fracture at shaft of proximal phalanx LEFT fourth toe. Electronically Signed   By: Lavonia Dana M.D.   On: 01/28/2018 09:22       Assessment & Plan:   1. Pain and swelling of toe, left - DG Toe 4th Left; Future  2. Closed displaced fracture of proximal phalanx of lesser toe of left foot, initial  encounter Minimally displaced. Reviewed with Orthopedic Surgery Liaison PA. Buddy tape. Post-op shoe. Anticipatory guidance provided.    Return in about 2 weeks (around 02/11/2018) for re-evalaution of toe fracture .   Fara Chute, PA-C [facility]

## 2018-01-29 ENCOUNTER — Encounter: Payer: Self-pay | Admitting: Internal Medicine

## 2018-01-29 ENCOUNTER — Ambulatory Visit (INDEPENDENT_AMBULATORY_CARE_PROVIDER_SITE_OTHER): Payer: Medicare Other | Admitting: Internal Medicine

## 2018-01-29 VITALS — BP 126/70 | HR 87 | Ht 66.5 in | Wt 185.0 lb

## 2018-01-29 DIAGNOSIS — R0600 Dyspnea, unspecified: Secondary | ICD-10-CM

## 2018-01-29 DIAGNOSIS — J449 Chronic obstructive pulmonary disease, unspecified: Secondary | ICD-10-CM

## 2018-01-29 DIAGNOSIS — R05 Cough: Secondary | ICD-10-CM

## 2018-01-29 DIAGNOSIS — R0902 Hypoxemia: Secondary | ICD-10-CM | POA: Diagnosis not present

## 2018-01-29 DIAGNOSIS — R058 Other specified cough: Secondary | ICD-10-CM

## 2018-01-29 LAB — PULMONARY FUNCTION TEST
DL/VA % pred: 46 %
DL/VA: 2.36 ml/min/mmHg/L
DLCO UNC % PRED: 32 %
DLCO UNC: 9.1 ml/min/mmHg
FEF 25-75 PRE: 0.77 L/s
FEF 25-75 Post: 1.43 L/sec
FEF2575-%Change-Post: 85 %
FEF2575-%PRED-PRE: 40 %
FEF2575-%Pred-Post: 75 %
FEV1-%Change-Post: 16 %
FEV1-%Pred-Post: 87 %
FEV1-%Pred-Pre: 74 %
FEV1-POST: 1.81 L
FEV1-Pre: 1.56 L
FEV1FVC-%CHANGE-POST: 3 %
FEV1FVC-%Pred-Pre: 84 %
FEV6-%CHANGE-POST: 12 %
FEV6-%PRED-POST: 103 %
FEV6-%PRED-PRE: 91 %
FEV6-POST: 2.66 L
FEV6-Pre: 2.36 L
FEV6FVC-%CHANGE-POST: 0 %
FEV6FVC-%PRED-POST: 102 %
FEV6FVC-%PRED-PRE: 102 %
FVC-%Change-Post: 12 %
FVC-%Pred-Post: 100 %
FVC-%Pred-Pre: 89 %
FVC-Post: 2.69 L
FVC-Pre: 2.39 L
POST FEV6/FVC RATIO: 99 %
PRE FEV1/FVC RATIO: 65 %
Post FEV1/FVC ratio: 67 %
Pre FEV6/FVC Ratio: 99 %
RV % PRED: 139 %
RV: 3.22 L
TLC % PRED: 107 %
TLC: 5.86 L

## 2018-01-29 MED ORDER — TIOTROPIUM BROMIDE-OLODATEROL 2.5-2.5 MCG/ACT IN AERS: 2.0000 | INHALATION_SPRAY | Freq: Every day | RESPIRATORY_TRACT | 0 refills | Status: DC

## 2018-01-29 NOTE — Progress Notes (Signed)
PFT done today. 

## 2018-01-29 NOTE — Patient Instructions (Addendum)
No change in medications    Please schedule a follow up visit in 6  months but call sooner if needed  - readdress 02 sats with ex/ ? Rehab next ov

## 2018-01-29 NOTE — Progress Notes (Signed)
Subjective:    Patient ID: Nicole Spence, female   DOB: [birth year],    MRN: [id number]     Brief patient profile:  8 yobf  Quit smoking 2015 noted onset of doe age [age] with sense of nasal drainage/ sore throat and eventually placed on spiriva then added symbicort but actually much better since started reflux meds early April 2019 and referred to pulmonary clinic 12/18/2017 by Dr   Reginia Forts p ent eval around 2017 pos gerd but had stopped PPI due to reported potential side effects in media .   12/18/2017 1st [facility] office visit/ Nicole Spence   Chief Complaint  Patient presents with  . Pulmonary Consult    Referred by Dr. Windell Moment for eval of COPD. Pt states she was told years ago she had chronic bronchitis "but it never really bothered me". She has SOB with exertion such as cleaning her house or washing her car. She has an albuterol inhaler that she rarely uses.   doe indolent onset minimal progressive since age 50  Esp carrying groceries in from car has trouble with steps and walks slower than avg people never tries saba [facility] = can't walk a nl pace on a flat grade s sob but does fine slow and flat  Takes  symbicort x 2 first thing am/ then spriva lunchtime and then symb 160   Cough better on otc gerd rx  Sleep ok  rec Stop spiriva and symbicort  Plan A = Automatic = stiolto 2 pffs each am  And continue the omeprazole Take 30-60 min before first meal of the day  Work on inhaler technique:   Plan B = Backup Only use your albuterol as a rescue medication   GERD diet    01/29/2018  f/u ov/Nicole Spence re: GOLD I copd / stiolto 2 puffs each am and gerd rx  Chief Complaint  Patient presents with  . Follow-up    PFT's done today. Breathing has improved some. She states she has not had to use her albuterol inhaler.   Dyspnea:  Improved now = MMRC1 = can walk nl pace, flat grade, can't hurry or go uphills or steps s sob   Cough: none Sleep: ok flat SABA use:   None    No obvious  day to day or daytime variability or assoc excess/ purulent sputum or mucus plugs or hemoptysis or cp or chest tightness, subjective wheeze or overt sinus or hb symptoms. No unusual exposure hx or h/o childhood pna/ asthma or knowledge of premature birth.  Sleeping  Ok flat s 02 and   without nocturnal  or early am exacerbation  of respiratory  c/o's or need for noct saba. Also denies any obvious fluctuation of symptoms with weather or environmental changes or other aggravating or alleviating factors except as outlined above   Current Allergies, Complete Past Medical History, Past Surgical History, Family History, and Social History were reviewed in [employer] record.  ROS  The following are not active complaints unless bolded Hoarseness, sore throat, dysphagia, dental problems, itching, sneezing,  nasal congestion or discharge of excess mucus or purulent secretions, ear ache,   fever, chills, sweats, unintended wt loss or wt gain, classically pleuritic or exertional cp,  orthopnea pnd or arm/hand swelling  or leg swelling, presyncope, palpitations, abdominal pain, anorexia, nausea, vomiting, diarrhea  or change in bowel habits or change in bladder habits, change in stools or change in urine, dysuria, hematuria,  rash, arthralgias/ L  foot in orthopedic shoe, visual complaints, headache, numbness, weakness or ataxia or problems with walking or coordination,  change in mood or  memory.        Current Meds  Medication Sig  . albuterol (PROVENTIL HFA;VENTOLIN HFA) 108 (90 Base) MCG/ACT inhaler Inhale 2 puffs into the lungs every 4 (four) hours as needed for wheezing or shortness of breath (cough, shortness of breath or wheezing.).  Marland Kitchen fluticasone (FLONASE) 50 MCG/ACT nasal spray USE 2 SPRAYS IN EACH  NOSTRIL DAILY  . hydrochlorothiazide (HYDRODIURIL) 25 MG tablet Take 1 tablet (25 mg total) by mouth daily.  Marland Kitchen loratadine (CLARITIN) 10 MG tablet Take 1 tablet (10 mg total) by mouth  daily.  . Multiple Vitamins-Minerals (WOMENS 50+ MULTI VITAMIN/MIN) TABS Take by mouth daily.  Marland Kitchen omeprazole (PRILOSEC) 20 MG capsule Take 1 capsule (20 mg total) by mouth daily.  . potassium chloride SA (K-DUR,KLOR-CON) 20 MEQ tablet Take 1 tablet (20 mEq total) by mouth daily.  . Tiotropium Bromide-Olodaterol (STIOLTO RESPIMAT) 2.5-2.5 MCG/ACT AERS Inhale 2 puffs into the lungs daily.  . [DISCONTINUED] Tiotropium Bromide-Olodaterol (STIOLTO RESPIMAT) 2.5-2.5 MCG/ACT AERS Inhale 2 puffs into the lungs daily.        Objective:   Physical Exam  amb bf nad    01/29/2018         185   12/18/17 183 lb (83 kg)  11/18/17 183 lb (83 kg)  10/12/17 183 lb (83 kg)    Vital signs reviewed - Note on arrival 02 sats  98% on RA     HEENT: nl dentition, turbinates bilaterally, and oropharynx. Nl external ear canals without cough reflex   NECK :  without JVD/Nodes/TM/ nl carotid upstrokes bilaterally   LUNGS: no acc muscle use,  Nl contour chest which is clear to A and P bilaterally without cough on insp or exp maneuvers   CV:  RRR  no s3 or murmur or increase in P2, and no edema   ABD:  soft and nontender with nl inspiratory excursion in the supine position. No bruits or organomegaly appreciated, bowel sounds nl  MS:    ext warm without deformities, calf tenderness, cyanosis or clubbing Wearing L orhthopedic shoe    SKIN: warm and dry without lesions    NEURO:  alert, approp, nl sensorium with  no motor or cerebellar deficits apparent.        I personally reviewed images and agree with radiology impression as follows:  Chest CTa 11/03/17  Emphysematous changes are noted. Mild fibrotic changes are seen within the left upper lobe. No focal infiltrate or sizable effusion is seen. No parenchymal nodule is noted.  Assessment:

## 2018-02-03 ENCOUNTER — Encounter: Payer: Self-pay | Admitting: Internal Medicine

## 2018-02-03 DIAGNOSIS — R0902 Hypoxemia: Secondary | ICD-10-CM | POA: Insufficient documentation

## 2018-02-03 NOTE — Assessment & Plan Note (Signed)
Quit smoking 2015 - 12/18/2017  Walked RA x 3 laps @ 185 ft each stopped due to  End of study, fast pace, sats 90% at end  -  Spirometry 12/18/2017  FEV1 1.63 (83%)  Ratio 65 - 12/18/2017  > changed from  symbicort /spiriva dpi to stiolto  - PFT's  01/29/2018  FEV1 1.81  (87 % ) ratio 67  p 16 % improvement from saba p nothing prior to study with DLCO  32 % corrects to 46  % for alv volume   - 01/29/2018  After extensive coaching inhaler device  effectiveness =    90%    Pt has improved on stiolto but was previously Group B in terms of symptom/risk and laba/lama therefore appropriate rx at this point and would continue x 6 months    I had an extended discussion with the patient reviewing all relevant studies completed to date and  lasting 15 to 20 minutes of a 25 minute visit    See device teaching which extended face to face time for this visit.  Each maintenance medication was reviewed in detail including emphasizing most importantly the difference between maintenance and prns and under what circumstances the prns are to be triggered using an action plan format that is not reflected in the computer generated alphabetically organized AVS which I have not found useful in most complex patients, especially with respiratory illnesses  Please see AVS for specific instructions unique to this visit that I personally wrote and verbalized to the the pt in detail and then reviewed with pt  by my nurse highlighting any  changes in therapy recommended at today's visit to their plan of care.

## 2018-02-03 NOTE — Assessment & Plan Note (Addendum)
01/29/2018   Walked RA  2 laps @ 185 ft each stopped due to  desats to 85% at nl pace/ no sob  She is technically qualified for ambulatory 02 but based on the new guidelines for stable copd it is optional > she declines for now / advised pacing and consideration for pulmonary rehab

## 2018-02-03 NOTE — Assessment & Plan Note (Signed)
ent eval around 2017 c/w GERD and worse off gerd rx - 12/18/2017  reported improved p ppi restarted > resolved 01/29/2018   No change rx needed

## 2018-02-11 ENCOUNTER — Other Ambulatory Visit: Payer: Self-pay

## 2018-02-11 ENCOUNTER — Ambulatory Visit (INDEPENDENT_AMBULATORY_CARE_PROVIDER_SITE_OTHER): Payer: Medicare Other

## 2018-02-11 ENCOUNTER — Encounter: Payer: Self-pay | Admitting: Family Medicine

## 2018-02-11 ENCOUNTER — Ambulatory Visit (INDEPENDENT_AMBULATORY_CARE_PROVIDER_SITE_OTHER): Payer: Medicare Other | Admitting: Family Medicine

## 2018-02-11 VITALS — BP 105/73 | HR 102 | Temp 98.2°F | Resp 17 | Ht 66.5 in | Wt 189.0 lb

## 2018-02-11 DIAGNOSIS — S92512D Displaced fracture of proximal phalanx of left lesser toe(s), subsequent encounter for fracture with routine healing: Secondary | ICD-10-CM

## 2018-02-11 DIAGNOSIS — M25572 Pain in left ankle and joints of left foot: Secondary | ICD-10-CM

## 2018-02-11 DIAGNOSIS — S92512A Displaced fracture of proximal phalanx of left lesser toe(s), initial encounter for closed fracture: Secondary | ICD-10-CM | POA: Diagnosis not present
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
## 2018-02-11 NOTE — Patient Instructions (Addendum)
X-ray looked okay today.  The toe appears to be healing and no other fractures/broken bones were noted.  Continue the postop shoe, and buddy taping the 4th-3rd toe for the next 2 to 3 weeks, then buddy tape toe as needed with activity.  I will provide a letter for jury duty in a few weeks, but you may not need that as symptoms may be improving.  If you do need a handicap permit, I can certainly fill out that paperwork.  Follow-up if you are not improving in the next 2 to 3 weeks, sooner if worse.  Tylenol if needed for pain, elevate foot when able.   Toe Fracture A toe fracture is a break in one of the toe bones (phalanges). What are the causes? This condition may be caused by:  Dropping a heavy object on your toe.  Stubbing your toe.  Overusing your toe or doing repetitive exercise.  Twisting or stretching your toe out of place.  What increases the risk? This condition is more likely to develop in people who:  Play contact sports.  Have a bone disease.  Have a low calcium level.  What are the signs or symptoms? The main symptoms of this condition are swelling and pain in the toe. The pain may get worse with standing or walking. Other symptoms include:  Bruising.  Stiffness.  Numbness.  A change in the way the toe looks.  Broken bones that poke through the skin.  Blood beneath the toenail.  How is this diagnosed? This condition is diagnosed with a physical exam. You may also have X-rays. How is this treated? Treatment for this condition depends on the type of fracture and its severity. Treatment may involve:  Taping the broken toe to a toe that is next to it (buddy taping). This is the most common treatment for fractures in which the bone has not moved out of place (nondisplaced fracture).  Wearing a shoe that has a wide, rigid sole to protect the toe and to limit its movement.  Wearing a walking cast.  Having a procedure to move the toe back into place.  Surgery.  This may be needed: ? If there are many pieces of broken bone that are out of place (displaced). ? If the toe joint breaks. ? If the bone breaks through the skin.  Physical therapy. This is done to help regain movement and strength in the toe.  You may need follow-up X-rays to make sure that the bone is healing well and staying in position. Follow these instructions at home: If you have a cast:  Do not stick anything inside the cast to scratch your skin. Doing that increases your risk of infection.  Check the skin around the cast every day. Report any concerns to your health care provider. You may put lotion on dry skin around the edges of the cast. Do not apply lotion to the skin underneath the cast.  Do not put pressure on any part of the cast until it is fully hardened. This may take several hours.  Keep the cast clean and dry. Bathing  Do not take baths, swim, or use a hot tub until your health care provider approves. Ask your health care provider if you can take showers. You may only be allowed to take sponge baths for bathing.  If your health care provider approves bathing and showering, cover the cast or bandage (dressing) with a watertight plastic bag to protect it from water. Do not let the  cast or dressing get wet. Managing pain, stiffness, and swelling  If you do not have a cast, apply ice to the injured area, if directed. ? Put ice in a plastic bag. ? Place a towel between your skin and the bag. ? Leave the ice on for 20 minutes, 2-3 times per day.  Move your toes often to avoid stiffness and to lessen swelling.  Raise (elevate) the injured area above the level of your heart while you are sitting or lying down. Driving  Do not drive or operate heavy machinery while taking pain medicine.  Do not drive while wearing a cast on a foot that you use for driving. Activity  Return to your normal activities as directed by your health care provider. Ask your health care  provider what activities are safe for you.  Perform exercises daily as directed by your health care provider or physical therapist. Safety  Do not use the injured limb to support your body weight until your health care provider says that you can. Use crutches or other assistive devices as directed by your health care provider. General instructions  If your toe was treated with buddy taping, follow your health care provider's instructions for changing the gauze and tape. Change it more often: ? The gauze and tape get wet. If this happens, dry the space between the toes. ? The gauze and tape are too tight and cause your toe to become pale or numb.  Wear a protective shoe as directed by your health care provider. If you were not given a protective shoe, wear sturdy, supportive shoes. Your shoes should not pinch your toes and should not fit tightly against your toes.  Do not use any tobacco products, including cigarettes, chewing tobacco, or e-cigarettes. Tobacco can delay bone healing. If you need help quitting, ask your health care provider.  Take medicines only as directed by your health care provider.  Keep all follow-up visits as directed by your health care provider. This is important. Contact a health care provider if:  You have a fever.  Your pain medicine is not helping.  Your toe is cold.  Your toe is numb.  You still have pain after one week of rest and treatment.  You still have pain after your health care provider has said that you can start walking again.  You have pain, tingling, or numbness in your foot that is not going away. Get help right away if:  You have severe pain.  You have redness or inflammation in your toe that is getting worse.  You have pain or numbness in your toe that is getting worse.  Your toe turns blue. This information is not intended to replace advice given to you by your health care provider. Make sure you discuss any questions you have  with your health care provider. Document Released: 08/08/2000 Document Revised: 04/14/2016 Document Reviewed: 06/07/2014 Elsevier Interactive Patient Education  2018 [employer].   IF you received an x-ray today, you will receive an invoice from [facility]. Please contact [facility] at [phone] with questions or concerns regarding your invoice.   IF you received labwork today, you will receive an invoice from [place]. Please contact [facility] at [phone] with questions or concerns regarding your invoice.   Our billing staff will not be able to assist you with questions regarding bills from these companies.  You will be contacted with the lab results as soon as they are available. The fastest way to get your results  is to activate your My Chart account. Instructions are located on the last page of this paperwork. If you have not heard from [name] regarding the results in 2 weeks, please contact this office.

## 2018-02-14 ENCOUNTER — Encounter: Payer: Self-pay | Admitting: Family Medicine

## 2018-02-23 ENCOUNTER — Other Ambulatory Visit: Payer: Self-pay | Admitting: Family Medicine

## 2018-02-23 DIAGNOSIS — I10 Essential (primary) hypertension: Secondary | ICD-10-CM

## 2018-03-15 ENCOUNTER — Other Ambulatory Visit: Payer: Self-pay | Admitting: Family Medicine

## 2018-03-15 DIAGNOSIS — Z1231 Encounter for screening mammogram for malignant neoplasm of breast: Secondary | ICD-10-CM

## 2018-03-22 ENCOUNTER — Ambulatory Visit (INDEPENDENT_AMBULATORY_CARE_PROVIDER_SITE_OTHER): Payer: Medicare Other | Admitting: Family Medicine

## 2018-03-22 ENCOUNTER — Other Ambulatory Visit: Payer: Self-pay

## 2018-03-22 ENCOUNTER — Ambulatory Visit: Payer: Medicare Other | Admitting: Family Medicine

## 2018-03-22 ENCOUNTER — Encounter: Payer: Self-pay | Admitting: Family Medicine

## 2018-03-22 VITALS — BP 138/82 | HR 93 | Temp 99.1°F | Resp 16 | Ht 66.5 in | Wt 191.4 lb

## 2018-03-22 DIAGNOSIS — I10 Essential (primary) hypertension: Secondary | ICD-10-CM

## 2018-03-22 DIAGNOSIS — R7301 Impaired fasting glucose: Secondary | ICD-10-CM

## 2018-03-22 DIAGNOSIS — Z0000 Encounter for general adult medical examination without abnormal findings: Secondary | ICD-10-CM | POA: Diagnosis not present

## 2018-03-22 LAB — LIPID PANEL
CHOL/HDL RATIO: 3 ratio (ref 0.0–4.4)
Cholesterol, Total: 157 mg/dL (ref 100–199)
HDL: 52 mg/dL (ref >39)
LDL Calculated: 84 mg/dL (ref 0–99)
Triglycerides: 104 mg/dL (ref 0–149)
VLDL Cholesterol Cal: 21 mg/dL (ref 5–40)

## 2018-03-22 LAB — COMPREHENSIVE METABOLIC PANEL
ALT: 16 IU/L (ref 0–32)
AST: 23 IU/L (ref 0–40)
Albumin/Globulin Ratio: 1.2 (ref 1.2–2.2)
Albumin: 4.1 g/dL (ref 3.6–4.8)
Alkaline Phosphatase: 113 IU/L (ref 39–117)
BILIRUBIN TOTAL: 0.6 mg/dL (ref 0.0–1.2)
BUN/Creatinine Ratio: 10 — ABNORMAL LOW (ref 12–28)
BUN: 10 mg/dL (ref 8–27)
CHLORIDE: 100 mmol/L (ref 96–106)
CO2: 25 mmol/L (ref 20–29)
Calcium: 9.8 mg/dL (ref 8.7–10.3)
Creatinine, Ser: 1.05 mg/dL — ABNORMAL HIGH (ref 0.57–1.00)
GFR calc non Af Amer: 54 mL/min/{1.73_m2} — ABNORMAL LOW (ref >59)
GFR, EST AFRICAN AMERICAN: 63 mL/min/{1.73_m2} (ref >59)
GLOBULIN, TOTAL: 3.3 g/dL (ref 1.5–4.5)
Glucose: 95 mg/dL (ref 65–99)
POTASSIUM: 3.8 mmol/L (ref 3.5–5.2)
SODIUM: 144 mmol/L (ref 134–144)
TOTAL PROTEIN: 7.4 g/dL (ref 6.0–8.5)

## 2018-03-22 LAB — HEMOGLOBIN A1C
ESTIMATED AVERAGE GLUCOSE: 134 mg/dL
HEMOGLOBIN A1C: 6.3 % — AB (ref 4.8–5.6)

## 2018-03-22 NOTE — Patient Instructions (Addendum)
IF you received an x-ray today, you will receive an invoice from [facility] Radiology. Please contact [facility] Radiology at [phone] with questions or concerns regarding your invoice.   IF you received labwork today, you will receive an invoice from [place]. Please contact [facility] at [phone] with questions or concerns regarding your invoice.   Our billing staff will not be able to assist you with questions regarding bills from these companies.  You will be contacted with the lab results as soon as they are available. The fastest way to get your results is to activate your My Chart account. Instructions are located on the last page of this paperwork. If you have not heard from [name] regarding the results in 2 weeks, please contact this office.    Prediabetes Eating Plan Prediabetes-also called impaired glucose tolerance or impaired fasting glucose-is a condition that causes blood sugar (blood glucose) levels to be higher than normal. Following a healthy diet can help to keep prediabetes under control. It can also help to lower the risk of type 2 diabetes and heart disease, which are increased in people who have prediabetes. Along with regular exercise, a healthy diet:  Promotes weight loss.  Helps to control blood sugar levels.  Helps to improve the way that the body uses insulin.  What do I need to know about this eating plan?  Use the glycemic index (GI) to plan your meals. The index tells you how quickly a food will raise your blood sugar. Choose low-GI foods. These foods take a longer time to raise blood sugar.  Pay close attention to the amount of carbohydrates in the food that you eat. Carbohydrates increase blood sugar levels.  Keep track of how many calories you take in. Eating the right amount of calories will help you to achieve a healthy weight. Losing about 7 percent of your starting weight can help to prevent type 2 diabetes.  You may want to follow a  Mediterranean diet. This diet includes a lot of vegetables, lean meats or fish, whole grains, fruits, and healthy oils and fats. What foods can I eat? Grains Whole grains, such as whole-wheat or whole-grain breads, crackers, cereals, and pasta. Unsweetened oatmeal. Bulgur. Barley. Quinoa. Brown rice. Corn or whole-wheat flour tortillas or taco shells. Vegetables Lettuce. Spinach. Peas. Beets. Cauliflower. Cabbage. Broccoli. Carrots. Tomatoes. Squash. Eggplant. Herbs. Peppers. Onions. Cucumbers. Brussels sprouts. Fruits Berries. Bananas. Apples. Oranges. Grapes. Papaya. Mango. Pomegranate. Kiwi. Grapefruit. Cherries. Meats and Other Protein Sources Seafood. Lean meats, such as chicken and [place] or lean cuts of pork and beef. Tofu. Eggs. Nuts. Beans. Dairy Low-fat or fat-free dairy products, such as yogurt, cottage cheese, and cheese. Beverages Water. Tea. Coffee. Sugar-free or diet soda. Seltzer water. Milk. Milk alternatives, such as soy or almond milk. Condiments Mustard. Relish. Low-fat, low-sugar ketchup. Low-fat, low-sugar barbecue sauce. Low-fat or fat-free mayonnaise. Sweets and Desserts Sugar-free or low-fat pudding. Sugar-free or low-fat ice cream and other frozen treats. Fats and Oils Avocado. Walnuts. Olive oil. The items listed above may not be a complete list of recommended foods or beverages. Contact your dietitian for more options. What foods are not recommended? Grains Refined white flour and flour products, such as bread, pasta, snack foods, and cereals. Beverages Sweetened drinks, such as sweet iced tea and soda. Sweets and Desserts Baked goods, such as cake, cupcakes, pastries, cookies, and cheesecake. The items listed above may not be a complete list of foods and beverages to avoid. Contact your dietitian for more information.  This information is not intended to replace advice given to you by your health care provider. Make sure you discuss any questions you have with  your health care provider. Document Released: 12/26/2014 Document Revised: 01/17/2016 Document Reviewed: 09/06/2014 Elsevier Interactive Patient Education  2017 [employer].
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
## 2018-03-31 ENCOUNTER — Other Ambulatory Visit: Payer: Self-pay | Admitting: Family Medicine

## 2018-04-01 ENCOUNTER — Other Ambulatory Visit: Payer: Self-pay | Admitting: Family Medicine

## 2018-04-06 ENCOUNTER — Ambulatory Visit
Admission: RE | Admit: 2018-04-06 | Discharge: 2018-04-06 | Disposition: A | Discharge status: Home / Self Care | Payer: Medicare Other | Source: Ambulatory Visit | Attending: Family Medicine | Admitting: Family Medicine

## 2018-04-06 ENCOUNTER — Encounter: Payer: Self-pay | Admitting: Family Medicine

## 2018-04-06 DIAGNOSIS — Z1231 Encounter for screening mammogram for malignant neoplasm of breast: Secondary | ICD-10-CM

## 2018-04-14 NOTE — Addendum Note (Signed)
Addended by: Delia Chimes A on: 04/14/2018 11:06 AM   Modules accepted: Level of Service

## 2018-06-17 DIAGNOSIS — H26493 Other secondary cataract, bilateral: Secondary | ICD-10-CM | POA: Diagnosis not present

## 2018-06-17 DIAGNOSIS — Z961 Presence of intraocular lens: Secondary | ICD-10-CM | POA: Diagnosis not present

## 2018-06-17 DIAGNOSIS — E119 Type 2 diabetes mellitus without complications: Secondary | ICD-10-CM | POA: Diagnosis not present

## 2018-06-17 DIAGNOSIS — H04123 Dry eye syndrome of bilateral lacrimal glands: Secondary | ICD-10-CM | POA: Diagnosis not present

## 2018-06-17 DIAGNOSIS — H35373 Puckering of macula, bilateral: Secondary | ICD-10-CM | POA: Diagnosis not present

## 2018-06-17 LAB — HM DIABETES EYE EXAM

## 2018-07-25 DIAGNOSIS — J189 Pneumonia, unspecified organism: Secondary | ICD-10-CM

## 2018-07-25 HISTORY — DX: Pneumonia, unspecified organism: J18.9

## 2018-08-02 ENCOUNTER — Ambulatory Visit (INDEPENDENT_AMBULATORY_CARE_PROVIDER_SITE_OTHER): Payer: Medicare Other | Admitting: Internal Medicine

## 2018-08-02 ENCOUNTER — Encounter: Payer: Self-pay | Admitting: Internal Medicine

## 2018-08-02 VITALS — BP 132/74 | HR 96 | Ht 65.0 in | Wt 189.0 lb

## 2018-08-02 DIAGNOSIS — J9611 Chronic respiratory failure with hypoxia: Secondary | ICD-10-CM

## 2018-08-02 DIAGNOSIS — J449 Chronic obstructive pulmonary disease, unspecified: Secondary | ICD-10-CM | POA: Diagnosis not present

## 2018-08-02 MED ORDER — FAMOTIDINE 20 MG PO TABS: ORAL_TABLET | ORAL | 11 refills | Status: DC

## 2018-08-02 NOTE — Progress Notes (Signed)
Subjective:    Patient ID: Nicole Spence, female   DOB: 05-10-1946,    MRN: [id number]     Brief patient profile:  59  yobf  Quit smoking 2015 noted onset of doe age [age] with sense of nasal drainage/ sore throat and eventually placed on spiriva then added symbicort but actually much better since started reflux meds early April 2019 and referred to pulmonary clinic 12/18/2017 by Dr   Reginia Forts p ent eval around 2017 pos gerd but had stopped PPI due to reported potential side effects in media .    History of Present Illness  12/18/2017 1st [facility] office visit/ Nicole Spence   Chief Complaint  Patient presents with  . Pulmonary Consult    Referred by Dr. Windell Moment for eval of COPD. Pt states she was told years ago she had chronic bronchitis "but it never really bothered me". She has SOB with exertion such as cleaning her house or washing her car. She has an albuterol inhaler that she rarely uses.   doe indolent onset minimal progressive since age 63  Esp carrying groceries in from car has trouble with steps and walks slower than avg people never tries saba [facility] = can't walk a nl pace on a flat grade s sob but does fine slow and flat  Takes  symbicort x 2 first thing am/ then spriva lunchtime and then symb 160   Cough better on otc gerd rx  Sleep ok  rec Stop spiriva and symbicort  Plan A = Automatic = stiolto 2 pffs each am  And continue the omeprazole Take 30-60 min before first meal of the day  Work on inhaler technique:   Plan B = Backup Only use your albuterol as a rescue medication   GERD diet    01/29/2018  f/u ov/Nicole Spence re: GOLD I copd / stiolto 2 puffs each am and gerd rx  Chief Complaint  Patient presents with  . Follow-up    PFT's done today. Breathing has improved some. She states she has not had to use her albuterol inhaler.   Dyspnea:  Improved now = MMRC1 = can walk nl pace, flat grade, can't hurry or go uphills or steps s sob   Cough: none Sleep: ok  flat SABA use:   None  rec No change in medications  Please schedule a follow up visit in 6  months but call sooner if needed  - readdress 02 sats with ex/ ? Rehab next ov     08/02/2018  f/u ov/Nicole Spence re:  Copd I / maint on stiolto  Chief Complaint  Patient presents with  . Follow-up    Breathing has been worse for the past 2 wks. She has had to use her rescue inhaler once in the past 2 wks.     Dyspnea:  MMRC2 = can't walk a nl pace on a flat grade s sob but does fine slow and flat  / no steps/ no HC   Cough: no am congestion Sleeping: 3-4 pillows sometimes bed flat  SABA use: only when over does it  02: not using   No obvious day to day or daytime variability or assoc excess/ purulent sputum or mucus plugs or hemoptysis or cp or chest tightness, subjective wheeze or overt sinus or hb symptoms.    Also denies any obvious fluctuation of symptoms with weather or environmental changes or other aggravating or alleviating factors except as outlined above   No unusual exposure hx  or h/o childhood pna/ asthma or knowledge of premature birth.  Current Allergies, Complete Past Medical History, Past Surgical History, Family History, and Social History were reviewed in [employer] record.  ROS  The following are not active complaints unless bolded Hoarseness, sore throat, dysphagia, dental problems, itching, sneezing,  nasal congestion or discharge of excess mucus or purulent secretions, ear ache,   fever, chills, sweats, unintended wt loss or wt gain, classically pleuritic or exertional cp,  orthopnea pnd or arm/hand swelling  or leg swelling, presyncope, palpitations, abdominal pain, anorexia, nausea, vomiting, diarrhea  or change in bowel habits or change in bladder habits, change in stools or change in urine, dysuria, hematuria,  rash, arthralgias, visual complaints, headache, numbness, weakness or ataxia or problems with walking or coordination,  change in mood or   memory.          Current Meds  Medication Sig  . albuterol (PROVENTIL HFA;VENTOLIN HFA) 108 (90 Base) MCG/ACT inhaler Inhale 2 puffs into the lungs every 4 (four) hours as needed for wheezing or shortness of breath (cough, shortness of breath or wheezing.).  Marland Kitchen fluticasone (FLONASE) 50 MCG/ACT nasal spray USE 2 SPRAYS IN EACH  NOSTRIL DAILY  . hydrochlorothiazide (HYDRODIURIL) 25 MG tablet Take 1 tablet (25 mg total) by mouth daily.  . Multiple Vitamins-Minerals (WOMENS 50+ MULTI VITAMIN/MIN) TABS Take by mouth daily.  . potassium chloride SA (K-DUR,KLOR-CON) 20 MEQ tablet Take 1 tablet (20 mEq total) by mouth daily.  . Tiotropium Bromide-Olodaterol (STIOLTO RESPIMAT) 2.5-2.5 MCG/ACT AERS Inhale 2 puffs into the lungs daily.                   Objective:   Physical Exam  amb bf na   08/02/2018        189   01/29/2018         185   12/18/17 183 lb (83 kg)  11/18/17 183 lb (83 kg)  10/12/17 183 lb (83 kg)    Vital signs reviewed - Note on arrival 02 sats  92% on RA      HEENT: nl dentition / oropharynx. Nl external ear canals without cough reflex -  Mild bilateral non-specific turbinate edema     NECK :  without JVD/Nodes/TM/ nl carotid upstrokes bilaterally   LUNGS: no acc muscle use,  Mild barrel  contour chest wall with bilateral  Distant bs s audible wheeze and  without cough on insp or exp maneuver and mild  Hyperresonant  to  percussion bilaterally     CV:  RRR  no s3 or murmur or increase in P2, and no edema   ABD:  soft and nontender with pos end  insp Hoover's  in the supine position. No bruits or organomegaly appreciated, bowel sounds nl  MS:   Nl gait/  ext warm without deformities, calf tenderness, cyanosis or clubbing No obvious joint restrictions   SKIN: warm and dry without lesions    NEURO:  alert, approp, nl sensorium with  no motor or cerebellar deficits apparent.          Assessment:

## 2018-08-02 NOTE — Patient Instructions (Addendum)
Pepcid (famotidine) 20 mg twice daily take after bfast and supper perfectly regularly  GERD (REFLUX)  is an extremely common cause of respiratory symptoms just like yours , many times with no obvious heartburn at all.    It can be treated with medication, but also with lifestyle changes including elevation of the head of your bed (ideally with 6-8  inch  bed blocks),  Smoking cessation, avoidance of late meals, excessive alcohol, and avoid fatty foods, chocolate, peppermint, colas, red wine, and acidic juices such as orange juice.  NO MINT OR MENTHOL PRODUCTS SO NO COUGH DROPS   USE SUGARLESS CANDY INSTEAD (Jolley ranchers or Stover's or Life Savers) or even ice chips will also do - the key is to swallow to prevent all throat clearing. NO OIL BASED VITAMINS - use powdered substitutes.   Please schedule a follow up office visit in 6 weeks, call sooner if needed with all medications /inhalers/ solutions in hand so we can verify exactly what you are taking. This includes all medications from all doctors and over the counters.

## 2018-08-04 ENCOUNTER — Encounter: Payer: Self-pay | Admitting: Internal Medicine

## 2018-08-04 NOTE — Assessment & Plan Note (Addendum)
08/02/2018 Patient Saturations on Room Air at Rest = 93%  >> Room Air while Ambulating = 83% Patient Saturations on 2 Liters of oxygen while Ambulating = 94%>  rx 2lpm with activity    I had an extended discussion with the patient  reviewing all relevant studies completed to date and  lasting 15 to 20 minutes of a 25 minute visit  which included directly observing ambulatory 02 saturation study documented in a/p section of  today's  office note.  See device teaching which extended face to face time for this visit   Each maintenance medication was reviewed in detail including most importantly the difference between maintenance and prns and under what circumstances the prns are to be triggered using an action plan format that is not reflected in the computer generated alphabetically organized AVS.     Please see AVS for specific instructions unique to this visit that I personally wrote and verbalized to the the pt in detail and then reviewed with pt  by my nurse highlighting any changes in therapy recommended at today's visit .

## 2018-08-04 NOTE — Assessment & Plan Note (Addendum)
Quit smoking 2015 - 12/18/2017  Walked RA x 3 laps @ 185 ft each stopped due to  End of study, fast pace, sats 90% at end  -  Spirometry 12/18/2017  FEV1 1.63 (83%)  Ratio 65 - 12/18/2017  > changed from  symbicort /spiriva dpi to stiolto - PFT's  01/29/2018  FEV1 1.81  (87 % ) ratio 67  p 16 % improvement from saba p nothing prior to study with DLCO  32 % corrects to 46  % for alv volume    - 08/02/2018  After extensive coaching inhaler device,  effectiveness =    75%   She has not done as well the last several weeks with a lot of upper airway symptoms that may be reflux related.  I recommended first adding Pepcid after meals and observing a strict GERD diet and lifestyle modifications including elevating the head of her bed with bed blocks.  Return in 6 weeks with all meds in hand using a trust but verify approach to confirm accurate Medication  Reconciliation The principal here is that until we are certain that the  patients are doing what we've asked, it makes no sense to ask them to do more.

## 2018-08-06 ENCOUNTER — Encounter: Payer: Self-pay | Admitting: Family Medicine

## 2018-08-06 NOTE — Progress Notes (Signed)
EYE EXAM IMPRESSION: complete exam with dilation did not reveal any diabetic retinopathy

## 2018-08-13 ENCOUNTER — Other Ambulatory Visit: Payer: Self-pay | Admitting: Internal Medicine

## 2018-08-13 MED ORDER — FAMOTIDINE 20 MG PO TABS: ORAL_TABLET | ORAL | 3 refills | Status: DC

## 2018-08-20 ENCOUNTER — Encounter: Payer: Self-pay | Admitting: *Deleted

## 2018-08-20 ENCOUNTER — Inpatient Hospital Stay (HOSPITAL_COMMUNITY)
Admission: EM | Admit: 2018-08-20 | Discharge: 2018-08-23 | DRG: 193 | Disposition: A | Discharge status: Home / Self Care | Payer: Medicare Other | Source: Ambulatory Visit | Attending: Internal Medicine | Admitting: Internal Medicine

## 2018-08-20 ENCOUNTER — Encounter (HOSPITAL_COMMUNITY): Payer: Self-pay | Admitting: Emergency Medicine

## 2018-08-20 ENCOUNTER — Ambulatory Visit: Payer: Medicare Other

## 2018-08-20 ENCOUNTER — Ambulatory Visit (INDEPENDENT_AMBULATORY_CARE_PROVIDER_SITE_OTHER)
Admission: EM | Admit: 2018-08-20 | Discharge: 2018-08-20 | Disposition: A | Discharge status: Home / Self Care | Payer: Medicare Other | Source: Home / Self Care

## 2018-08-20 ENCOUNTER — Other Ambulatory Visit: Payer: Self-pay

## 2018-08-20 DIAGNOSIS — J189 Pneumonia, unspecified organism: Secondary | ICD-10-CM | POA: Diagnosis present

## 2018-08-20 DIAGNOSIS — J440 Chronic obstructive pulmonary disease with acute lower respiratory infection: Secondary | ICD-10-CM | POA: Diagnosis present

## 2018-08-20 DIAGNOSIS — I10 Essential (primary) hypertension: Secondary | ICD-10-CM | POA: Diagnosis not present

## 2018-08-20 DIAGNOSIS — Z87891 Personal history of nicotine dependence: Secondary | ICD-10-CM

## 2018-08-20 DIAGNOSIS — J9611 Chronic respiratory failure with hypoxia: Secondary | ICD-10-CM | POA: Insufficient documentation

## 2018-08-20 DIAGNOSIS — Z833 Family history of diabetes mellitus: Secondary | ICD-10-CM

## 2018-08-20 DIAGNOSIS — J9601 Acute respiratory failure with hypoxia: Secondary | ICD-10-CM | POA: Diagnosis not present

## 2018-08-20 DIAGNOSIS — E876 Hypokalemia: Secondary | ICD-10-CM | POA: Diagnosis not present

## 2018-08-20 DIAGNOSIS — Z823 Family history of stroke: Secondary | ICD-10-CM

## 2018-08-20 DIAGNOSIS — R0902 Hypoxemia: Secondary | ICD-10-CM | POA: Diagnosis not present

## 2018-08-20 DIAGNOSIS — R0602 Shortness of breath: Secondary | ICD-10-CM | POA: Diagnosis not present

## 2018-08-20 DIAGNOSIS — R918 Other nonspecific abnormal finding of lung field: Secondary | ICD-10-CM | POA: Diagnosis not present

## 2018-08-20 DIAGNOSIS — Z8719 Personal history of other diseases of the digestive system: Secondary | ICD-10-CM | POA: Diagnosis not present

## 2018-08-20 DIAGNOSIS — K5730 Diverticulosis of large intestine without perforation or abscess without bleeding: Secondary | ICD-10-CM | POA: Diagnosis present

## 2018-08-20 DIAGNOSIS — J181 Lobar pneumonia, unspecified organism: Secondary | ICD-10-CM | POA: Diagnosis not present

## 2018-08-20 DIAGNOSIS — Z7951 Long term (current) use of inhaled steroids: Secondary | ICD-10-CM

## 2018-08-20 DIAGNOSIS — Z8249 Family history of ischemic heart disease and other diseases of the circulatory system: Secondary | ICD-10-CM

## 2018-08-20 DIAGNOSIS — J449 Chronic obstructive pulmonary disease, unspecified: Secondary | ICD-10-CM | POA: Diagnosis present

## 2018-08-20 DIAGNOSIS — J441 Chronic obstructive pulmonary disease with (acute) exacerbation: Secondary | ICD-10-CM | POA: Diagnosis not present

## 2018-08-20 DIAGNOSIS — R05 Cough: Secondary | ICD-10-CM | POA: Diagnosis not present

## 2018-08-20 DIAGNOSIS — R0981 Nasal congestion: Secondary | ICD-10-CM | POA: Diagnosis not present

## 2018-08-20 DIAGNOSIS — Z803 Family history of malignant neoplasm of breast: Secondary | ICD-10-CM

## 2018-08-20 DIAGNOSIS — Z91048 Other nonmedicinal substance allergy status: Secondary | ICD-10-CM

## 2018-08-20 DIAGNOSIS — R7302 Impaired glucose tolerance (oral): Secondary | ICD-10-CM | POA: Diagnosis present

## 2018-08-20 LAB — COMPREHENSIVE METABOLIC PANEL
ALT: 18 U/L (ref 0–44)
AST: 26 U/L (ref 15–41)
Albumin: 4.1 g/dL (ref 3.5–5.0)
Alkaline Phosphatase: 77 U/L (ref 38–126)
Anion gap: 13 (ref 5–15)
BUN: 10 mg/dL (ref 8–23)
CHLORIDE: 97 mmol/L — AB (ref 98–111)
CO2: 27 mmol/L (ref 22–32)
Calcium: 9.3 mg/dL (ref 8.9–10.3)
Creatinine, Ser: 0.95 mg/dL (ref 0.44–1.00)
GFR calc non Af Amer: >60 mL/min (ref >60)
Glucose, Bld: 118 mg/dL — ABNORMAL HIGH (ref 70–99)
Potassium: 2.6 mmol/L — CL (ref 3.5–5.1)
Sodium: 137 mmol/L (ref 135–145)
Total Bilirubin: 0.7 mg/dL (ref 0.3–1.2)
Total Protein: 8.9 g/dL — ABNORMAL HIGH (ref 6.5–8.1)

## 2018-08-20 LAB — CBC WITH DIFFERENTIAL/PLATELET
Abs Immature Granulocytes: 0.05 10*3/uL (ref 0.00–0.07)
Basophils Absolute: 0 10*3/uL (ref 0.0–0.1)
Basophils Relative: 0 %
Eosinophils Absolute: 0.1 10*3/uL (ref 0.0–0.5)
Eosinophils Relative: 1 %
HCT: 48 % — ABNORMAL HIGH (ref 36.0–46.0)
Hemoglobin: 15.6 g/dL — ABNORMAL HIGH (ref 12.0–15.0)
Immature Granulocytes: 0 %
Lymphocytes Relative: 25 %
Lymphs Abs: 2.9 10*3/uL (ref 0.7–4.0)
MCH: 30.1 pg (ref 26.0–34.0)
MCHC: 32.5 g/dL (ref 30.0–36.0)
MCV: 92.5 fL (ref 80.0–100.0)
Monocytes Absolute: 0.7 10*3/uL (ref 0.1–1.0)
Monocytes Relative: 6 %
NRBC: 0 % (ref 0.0–0.2)
Neutro Abs: 7.8 10*3/uL — ABNORMAL HIGH (ref 1.7–7.7)
Neutrophils Relative %: 68 %
Platelets: 270 10*3/uL (ref 150–400)
RBC: 5.19 MIL/uL — ABNORMAL HIGH (ref 3.87–5.11)
RDW: 14.2 % (ref 11.5–15.5)
WBC: 11.6 10*3/uL — ABNORMAL HIGH (ref 4.0–10.5)

## 2018-08-20 LAB — INFLUENZA PANEL BY PCR (TYPE A & B)
Influenza A By PCR: NEGATIVE
Influenza B By PCR: NEGATIVE

## 2018-08-20 LAB — URINALYSIS, ROUTINE W REFLEX MICROSCOPIC
Bilirubin Urine: NEGATIVE
Glucose, UA: NEGATIVE mg/dL
Ketones, ur: NEGATIVE mg/dL
Leukocytes, UA: NEGATIVE
NITRITE: NEGATIVE
Protein, ur: NEGATIVE mg/dL
SPECIFIC GRAVITY, URINE: 1.002 — AB (ref 1.005–1.030)
pH: 6 (ref 5.0–8.0)

## 2018-08-20 LAB — BRAIN NATRIURETIC PEPTIDE: B Natriuretic Peptide: 34.4 pg/mL (ref 0.0–100.0)

## 2018-08-20 LAB — BLOOD GAS, VENOUS
Acid-Base Excess: 4.9 mmol/L — ABNORMAL HIGH (ref 0.0–2.0)
Bicarbonate: 29.7 mmol/L — ABNORMAL HIGH (ref 20.0–28.0)
O2 Content: 5 L/min
O2 Saturation: 68.6 %
Patient temperature: 98.6
pCO2, Ven: 46 mmHg (ref 44.0–60.0)
pH, Ven: 7.426 (ref 7.250–7.430)
pO2, Ven: 39.5 mmHg (ref 32.0–45.0)

## 2018-08-20 LAB — I-STAT TROPONIN, ED: TROPONIN I, POC: 0.01 ng/mL (ref 0.00–0.08)

## 2018-08-20 LAB — I-STAT CG4 LACTIC ACID, ED: LACTIC ACID, VENOUS: 1.8 mmol/L (ref 0.5–1.9)

## 2018-08-20 MED ORDER — MAGNESIUM SULFATE 2 GM/50ML IV SOLN
2.0000 g | Freq: Once | INTRAVENOUS | Status: AC
  Administered 2018-08-20: 2 g via INTRAVENOUS
  Filled 2018-08-20: qty 50

## 2018-08-20 MED ORDER — SODIUM CHLORIDE 0.9 % IV SOLN
500.0000 mg | INTRAVENOUS | Status: DC
  Administered 2018-08-20: 500 mg via INTRAVENOUS
  Filled 2018-08-20 (×2): qty 500

## 2018-08-20 MED ORDER — ONDANSETRON HCL 4 MG/2ML IJ SOLN: 4.0000 mg | Freq: Four times a day (QID) | INTRAMUSCULAR | Status: DC | PRN

## 2018-08-20 MED ORDER — ALBUTEROL SULFATE (2.5 MG/3ML) 0.083% IN NEBU
2.5000 mg | INHALATION_SOLUTION | RESPIRATORY_TRACT | Status: DC
  Administered 2018-08-21: 2.5 mg via RESPIRATORY_TRACT
  Filled 2018-08-20: qty 3

## 2018-08-20 MED ORDER — ALBUTEROL SULFATE (2.5 MG/3ML) 0.083% IN NEBU: 2.5000 mg | INHALATION_SOLUTION | RESPIRATORY_TRACT | Status: DC | PRN

## 2018-08-20 MED ORDER — POTASSIUM CHLORIDE CRYS ER 20 MEQ PO TBCR
20.0000 meq | EXTENDED_RELEASE_TABLET | Freq: Every day | ORAL | Status: DC
  Administered 2018-08-21 – 2018-08-23 (×3): 20 meq via ORAL
  Filled 2018-08-20 (×3): qty 1

## 2018-08-20 MED ORDER — ONDANSETRON HCL 4 MG PO TABS: 4.0000 mg | ORAL_TABLET | Freq: Four times a day (QID) | ORAL | Status: DC | PRN

## 2018-08-20 MED ORDER — METHYLPREDNISOLONE SODIUM SUCC 125 MG IJ SOLR
125.0000 mg | Freq: Once | INTRAMUSCULAR | Status: AC
  Administered 2018-08-20: 125 mg via INTRAVENOUS
  Filled 2018-08-20: qty 2

## 2018-08-20 MED ORDER — ACETAMINOPHEN 325 MG PO TABS: 650.0000 mg | ORAL_TABLET | Freq: Four times a day (QID) | ORAL | Status: DC | PRN

## 2018-08-20 MED ORDER — POTASSIUM CHLORIDE CRYS ER 20 MEQ PO TBCR
20.0000 meq | EXTENDED_RELEASE_TABLET | Freq: Once | ORAL | Status: AC
  Administered 2018-08-21: 20 meq via ORAL
  Filled 2018-08-20: qty 1

## 2018-08-20 MED ORDER — POTASSIUM CHLORIDE CRYS ER 20 MEQ PO TBCR
40.0000 meq | EXTENDED_RELEASE_TABLET | Freq: Once | ORAL | Status: AC
  Administered 2018-08-20: 40 meq via ORAL
  Filled 2018-08-20: qty 2

## 2018-08-20 MED ORDER — IPRATROPIUM-ALBUTEROL 0.5-2.5 (3) MG/3ML IN SOLN
3.0000 mL | Freq: Once | RESPIRATORY_TRACT | Status: AC
  Administered 2018-08-20: 3 mL via RESPIRATORY_TRACT

## 2018-08-20 MED ORDER — SODIUM CHLORIDE 0.9 % IV SOLN
2.0000 g | INTRAVENOUS | Status: DC
  Administered 2018-08-20 – 2018-08-22 (×3): 2 g via INTRAVENOUS
  Filled 2018-08-20 (×2): qty 20
  Filled 2018-08-20: qty 2
  Filled 2018-08-20: qty 20
  Filled 2018-08-20: qty 2

## 2018-08-20 MED ORDER — SODIUM CHLORIDE 0.9 % IV BOLUS
1000.0000 mL | Freq: Once | INTRAVENOUS | Status: AC
  Administered 2018-08-20: 1000 mL via INTRAVENOUS

## 2018-08-20 MED ORDER — ALBUTEROL SULFATE (2.5 MG/3ML) 0.083% IN NEBU
2.5000 mg | INHALATION_SOLUTION | Freq: Once | RESPIRATORY_TRACT | Status: AC
  Administered 2018-08-20: 2.5 mg via RESPIRATORY_TRACT
  Filled 2018-08-20: qty 3

## 2018-08-20 MED ORDER — FAMOTIDINE 20 MG PO TABS
20.0000 mg | ORAL_TABLET | Freq: Two times a day (BID) | ORAL | Status: DC
  Administered 2018-08-21 – 2018-08-23 (×6): 20 mg via ORAL
  Filled 2018-08-20 (×6): qty 1

## 2018-08-20 MED ORDER — HYDRALAZINE HCL 20 MG/ML IJ SOLN: 10.0000 mg | INTRAMUSCULAR | Status: DC | PRN

## 2018-08-20 MED ORDER — FLUTICASONE PROPIONATE 50 MCG/ACT NA SUSP
2.0000 | Freq: Every day | NASAL | Status: DC
  Administered 2018-08-22 – 2018-08-23 (×2): 2 via NASAL
  Filled 2018-08-20: qty 16

## 2018-08-20 MED ORDER — POTASSIUM CHLORIDE 10 MEQ/100ML IV SOLN
10.0000 meq | INTRAVENOUS | Status: DC
  Administered 2018-08-20 – 2018-08-21 (×2): 10 meq via INTRAVENOUS
  Filled 2018-08-20 (×3): qty 100

## 2018-08-20 MED ORDER — BUDESONIDE 0.25 MG/2ML IN SUSP
0.2500 mg | Freq: Two times a day (BID) | RESPIRATORY_TRACT | Status: DC
  Administered 2018-08-21 – 2018-08-23 (×6): 0.25 mg via RESPIRATORY_TRACT
  Filled 2018-08-20 (×6): qty 2

## 2018-08-20 MED ORDER — ENOXAPARIN SODIUM 40 MG/0.4ML ~~LOC~~ SOLN
40.0000 mg | Freq: Every day | SUBCUTANEOUS | Status: DC
  Administered 2018-08-21 – 2018-08-22 (×3): 40 mg via SUBCUTANEOUS
  Filled 2018-08-20 (×3): qty 0.4

## 2018-08-20 MED ORDER — ACETAMINOPHEN 650 MG RE SUPP: 650.0000 mg | Freq: Four times a day (QID) | RECTAL | Status: DC | PRN

## 2018-08-20 NOTE — ED Notes (Signed)
  Critical Lab: Potassium 2.6  EDP notified.
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
## 2018-08-20 NOTE — ED Notes (Signed)
Pt placed on 2L and oxygen is now 80%, patient in no distress. Hallie PA at bedside.

## 2018-08-20 NOTE — ED Notes (Signed)
Pt placed on 4L, oxygen saturation is now 91%. Patient is in no distress.

## 2018-08-20 NOTE — ED Triage Notes (Signed)
Pt c/o SOB and cough for several days. Reports she went to UC and was diagnosed with PNA.

## 2018-08-20 NOTE — ED Notes (Signed)
Bed: SC38 Expected date:  Expected time:  Means of arrival:  Comments: Res B droplet precautions

## 2018-08-20 NOTE — Discharge Instructions (Addendum)
Please go to emergency room for further treatment and evaluation

## 2018-08-20 NOTE — ED Notes (Signed)
ED TO INPATIENT HANDOFF REPORT  Name/Age/Gender Nicole Spence 72 y.o. female  Code Status   Home/SNF/Other Home  Chief Complaint SOB  Level of Care/Admitting Diagnosis ED Disposition    ED Disposition Condition [facility] Area: [place] [[id number]]  Level of Care: Telemetry [5]  Admit to tele based on following criteria: Monitor QTC interval  Diagnosis: CAP (community acquired pneumonia) [371062]  Admitting Physician: [name] [phone]  Attending Physician: [name] [[id number]]  PT Class (Do Not Modify): Observation [104]  PT Acc Code (Do Not Modify): Observation [10022]       Medical History Past Medical History:  Diagnosis Date  . Cataract   . Colon polyps 02/09/2007   [facility]; colonoscopy.  Marland Kitchen COPD (chronic obstructive pulmonary disease) ([place])   . Diverticula of colon 02/09/2007  . Glucose intolerance (impaired glucose tolerance)   . Hypertension   . Internal hemorrhoids 02/09/2007.    Allergies Allergies  Allergen Reactions  . Adhesive [Tape] Other (See Comments)    IRRITATES SKIN     IV Location/Drains/Wounds Patient Lines/Drains/Airways Status   Active Line/Drains/Airways    Name:   Placement date:   Placement time:   Site:   Days:   Peripheral IV 08/20/18 Left Antecubital   08/20/18    1910    Antecubital   less than 1   Peripheral IV 08/20/18 Right;Anterior;Upper Forearm   08/20/18    2035    Forearm   less than 1          Labs/Imaging Results for orders placed or performed during the hospital encounter of 08/20/18 (from the past 48 hour(s))  Brain natriuretic peptide     Status: None   Collection Time: 08/20/18  7:03 PM  Result Value Ref Range   B Natriuretic Peptide 34.4 0.0 - 100.0 pg/mL    Comment: Performed at [facility], [place] [address], [place], [place] [address]  Comprehensive metabolic panel     Status: Abnormal   Collection Time: 08/20/18  7:08 PM  Result Value Ref  Range   Sodium 137 135 - 145 mmol/L   Potassium 2.6 (LL) 3.5 - 5.1 mmol/L    Comment: CRITICAL RESULT CALLED TO, READ BACK BY AND VERIFIED WITH: WOOD,A RN @2016  ON 08/20/18 JACKSON,K     Chloride 97 (L) 98 - 111 mmol/L   CO2 27 22 - 32 mmol/L   Glucose, Bld 118 (H) 70 - 99 mg/dL   BUN 10 8 - 23 mg/dL   Creatinine, Ser 0.95 0.44 - 1.00 mg/dL   Calcium 9.3 8.9 - 10.3 mg/dL   Total Protein 8.9 (H) 6.5 - 8.1 g/dL   Albumin 4.1 3.5 - 5.0 g/dL   AST 26 15 - 41 U/L   ALT 18 0 - 44 U/L   Alkaline Phosphatase 77 38 - 126 U/L   Total Bilirubin 0.7 0.3 - 1.2 mg/dL   GFR calc non Af Amer >60 >60 mL/min   GFR calc Af Amer >60 >60 mL/min   Anion gap 13 5 - 15    Comment: Performed at [facility], [place] [address], [place], [place] [address]  CBC WITH DIFFERENTIAL     Status: Abnormal   Collection Time: 08/20/18  7:08 PM  Result Value Ref Range   WBC 11.6 (H) 4.0 - 10.5 K/uL   RBC 5.19 (H) 3.87 - 5.11 MIL/uL   Hemoglobin 15.6 (H) 12.0 - 15.0 g/dL   HCT  48.0 (H) 36.0 - 46.0 %   MCV 92.5 80.0 - 100.0 fL   MCH 30.1 26.0 - 34.0 pg   MCHC 32.5 30.0 - 36.0 g/dL   RDW 14.2 11.5 - 15.5 %   Platelets 270 150 - 400 K/uL   nRBC 0.0 0.0 - 0.2 %   Neutrophils Relative % 68 %   Neutro Abs 7.8 (H) 1.7 - 7.7 K/uL   Lymphocytes Relative 25 %   Lymphs Abs 2.9 0.7 - 4.0 K/uL   Monocytes Relative 6 %   Monocytes Absolute 0.7 0.1 - 1.0 K/uL   Eosinophils Relative 1 %   Eosinophils Absolute 0.1 0.0 - 0.5 K/uL   Basophils Relative 0 %   Basophils Absolute 0.0 0.0 - 0.1 K/uL   Immature Granulocytes 0 %   Abs Immature Granulocytes 0.05 0.00 - 0.07 K/uL    Comment: Performed at [facility], [place] [address] [place], [place] [address]  I-stat troponin, ED (not at [facility])     Status: None   Collection Time: 08/20/18  7:08 PM  Result Value Ref Range   Troponin i, poc 0.01 0.00 - 0.08 ng/mL   Comment 3            Comment: Due to the release kinetics of cTnI, a  negative result within the first hours of the onset of symptoms does not rule out myocardial infarction with certainty. If myocardial infarction is still suspected, repeat the test at appropriate intervals.   I-Stat CG4 Lactic Acid, ED     Status: None   Collection Time: 08/20/18  7:10 PM  Result Value Ref Range   Lactic Acid, Venous 1.80 0.5 - 1.9 mmol/L  Influenza panel by PCR (type A & B)     Status: None   Collection Time: 08/20/18  7:13 PM  Result Value Ref Range   Influenza A By PCR NEGATIVE NEGATIVE   Influenza B By PCR NEGATIVE NEGATIVE    Comment: (NOTE) The Xpert Xpress Flu assay is intended as an aid in the diagnosis of  influenza and should not be used as a sole basis for treatment.  This  assay is FDA approved for nasopharyngeal swab specimens only. Nasal  washings and aspirates are unacceptable for Xpert Xpress Flu testing. Performed at [facility], [place] [address] [place], [place] [address]   Blood gas, venous ([facility], [facility], [facility])     Status: Abnormal   Collection Time: 08/20/18  7:30 PM  Result Value Ref Range   O2 Content 5.0 L/min   Delivery systems NASAL CANNULA    pH, Ven 7.426 7.250 - 7.430   pCO2, Ven 46.0 44.0 - 60.0 mmHg   pO2, Ven 39.5 32.0 - 45.0 mmHg   Bicarbonate 29.7 (H) 20.0 - 28.0 mmol/L   Acid-Base Excess 4.9 (H) 0.0 - 2.0 mmol/L   O2 Saturation 68.6 %   Patient temperature 98.6    Collection site VEIN    Drawn by COLLECTED BY NURSE    Sample type VENOUS     Comment: Performed at [place] [address], [place], [place] [address]  Urinalysis, Routine w reflex microscopic     Status: Abnormal   Collection Time: 08/20/18  8:37 PM  Result Value Ref Range   Color, Urine STRAW (A) YELLOW   APPearance CLEAR CLEAR   Specific Gravity, Urine 1.002 (L) 1.005 - 1.030   pH 6.0 5.0 - 8.0   Glucose, UA NEGATIVE NEGATIVE  mg/dL   Hgb urine dipstick SMALL (A) NEGATIVE   Bilirubin Urine NEGATIVE NEGATIVE   Ketones,  ur NEGATIVE NEGATIVE mg/dL   Protein, ur NEGATIVE NEGATIVE mg/dL   Nitrite NEGATIVE NEGATIVE   Leukocytes, UA NEGATIVE NEGATIVE   RBC / HPF 0-5 0 - 5 RBC/hpf   WBC, UA 0-5 0 - 5 WBC/hpf   Bacteria, UA RARE (A) NONE SEEN   Squamous Epithelial / LPF 0-5 0 - 5    Comment: Performed at [facility], [place] [address] [place], [place] [address]   Dg Chest 2 View  Result Date: 08/20/2018 CLINICAL DATA:  Constant dry cough congestion occasionally productive with clear sputum. EXAM: CHEST - 2 VIEW COMPARISON:  None. FINDINGS: The cardiopericardial silhouette is normal. Minimal aortic atherosclerosis is noted without aneurysm. Patchy airspace opacity in the anterior segment of right upper lobe is identified suspicious for pneumonia. No overt pulmonary edema. No effusion. Mild degenerative change of the upper and midthoracic spine. IMPRESSION: Patchy airspace opacity in the anterior segment of the right upper lobe, suspicious for pneumonia. Electronically Signed   By: Ashley Royalty M.D.   On: 08/20/2018 18:32    Pending Labs Unresulted Labs (From admission, onward)    Start     Ordered   08/20/18 1858  Blood Culture (routine x 2)  BLOOD CULTURE X 2,   STAT     08/20/18 1858          Vitals/Pain Today's Vitals   08/20/18 1855 08/20/18 1940 08/20/18 2034 08/20/18 2136  BP:  140/88 (!) 144/75 136/80  Pulse:  87 89 82  Resp:  (!) 23 18 18   Temp:    98.4 F (36.9 C)  TempSrc:    Oral  SpO2: 95% 94% 95% 92%  PainSc:        Isolation Precautions Droplet precaution  Medications Medications  cefTRIAXone (ROCEPHIN) 2 g in sodium chloride 0.9 % 100 mL IVPB (0 g Intravenous Stopped 08/20/18 2135)  azithromycin (ZITHROMAX) 500 mg in sodium chloride 0.9 % 250 mL IVPB (has no administration in time range)  potassium chloride SA (K-DUR,KLOR-CON) CR tablet 40 mEq (has no administration in time range)  potassium chloride 10 mEq in 100 mL IVPB (has no administration in time range)   sodium chloride 0.9 % bolus 1,000 mL (0 mLs Intravenous Stopped 08/20/18 2041)  albuterol (PROVENTIL) (2.5 MG/3ML) 0.083% nebulizer solution 2.5 mg (2.5 mg Nebulization Given 08/20/18 1909)  magnesium sulfate IVPB 2 g 50 mL (0 g Intravenous Stopped 08/20/18 2135)  methylPREDNISolone sodium succinate (SOLU-MEDROL) 125 mg/2 mL injection 125 mg (125 mg Intravenous Given 08/20/18 1912)    Mobility walks
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
## 2018-08-20 NOTE — ED Notes (Signed)
Patient offered EMS transport to hospital due to continued need for oxygen in order to support O2 saturations.  Patient refused on multiple attempts.  Instructions to report directly to the ER provided by APP prior to discharge.

## 2018-08-20 NOTE — ED Triage Notes (Signed)
Patient reports feeling bad since Saturday. Complains of shortness of breath, congestion, and cough. Patient sees pulmonology, may need to be placed on O2 for sats in the upper 80s. No fevers. Patient has increased usage of emergency inhaler. Patient took mucinex.
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
## 2018-08-21 DIAGNOSIS — Z833 Family history of diabetes mellitus: Secondary | ICD-10-CM | POA: Diagnosis not present

## 2018-08-21 DIAGNOSIS — J181 Lobar pneumonia, unspecified organism: Secondary | ICD-10-CM | POA: Diagnosis present

## 2018-08-21 DIAGNOSIS — Z91048 Other nonmedicinal substance allergy status: Secondary | ICD-10-CM | POA: Diagnosis not present

## 2018-08-21 DIAGNOSIS — J189 Pneumonia, unspecified organism: Secondary | ICD-10-CM | POA: Diagnosis not present

## 2018-08-21 DIAGNOSIS — R7302 Impaired glucose tolerance (oral): Secondary | ICD-10-CM | POA: Diagnosis present

## 2018-08-21 DIAGNOSIS — Z87891 Personal history of nicotine dependence: Secondary | ICD-10-CM | POA: Diagnosis not present

## 2018-08-21 DIAGNOSIS — E876 Hypokalemia: Secondary | ICD-10-CM | POA: Diagnosis not present

## 2018-08-21 DIAGNOSIS — J449 Chronic obstructive pulmonary disease, unspecified: Secondary | ICD-10-CM | POA: Diagnosis not present

## 2018-08-21 DIAGNOSIS — Z823 Family history of stroke: Secondary | ICD-10-CM | POA: Diagnosis not present

## 2018-08-21 DIAGNOSIS — Z8249 Family history of ischemic heart disease and other diseases of the circulatory system: Secondary | ICD-10-CM | POA: Diagnosis not present

## 2018-08-21 DIAGNOSIS — J9601 Acute respiratory failure with hypoxia: Secondary | ICD-10-CM | POA: Diagnosis not present

## 2018-08-21 DIAGNOSIS — J441 Chronic obstructive pulmonary disease with (acute) exacerbation: Secondary | ICD-10-CM | POA: Diagnosis not present

## 2018-08-21 DIAGNOSIS — Z8719 Personal history of other diseases of the digestive system: Secondary | ICD-10-CM | POA: Diagnosis not present

## 2018-08-21 DIAGNOSIS — Z7951 Long term (current) use of inhaled steroids: Secondary | ICD-10-CM | POA: Diagnosis not present

## 2018-08-21 DIAGNOSIS — K5730 Diverticulosis of large intestine without perforation or abscess without bleeding: Secondary | ICD-10-CM | POA: Diagnosis present

## 2018-08-21 DIAGNOSIS — J440 Chronic obstructive pulmonary disease with acute lower respiratory infection: Secondary | ICD-10-CM | POA: Diagnosis present

## 2018-08-21 DIAGNOSIS — I10 Essential (primary) hypertension: Secondary | ICD-10-CM | POA: Diagnosis not present

## 2018-08-21 DIAGNOSIS — R0902 Hypoxemia: Secondary | ICD-10-CM

## 2018-08-21 DIAGNOSIS — R0602 Shortness of breath: Secondary | ICD-10-CM | POA: Diagnosis not present

## 2018-08-21 DIAGNOSIS — Z803 Family history of malignant neoplasm of breast: Secondary | ICD-10-CM | POA: Diagnosis not present

## 2018-08-21 LAB — CBC
HCT: 44.2 % (ref 36.0–46.0)
Hemoglobin: 14 g/dL (ref 12.0–15.0)
MCH: 29.4 pg (ref 26.0–34.0)
MCHC: 31.7 g/dL (ref 30.0–36.0)
MCV: 92.7 fL (ref 80.0–100.0)
Platelets: 276 10*3/uL (ref 150–400)
RBC: 4.77 MIL/uL (ref 3.87–5.11)
RDW: 14.4 % (ref 11.5–15.5)
WBC: 7.7 10*3/uL (ref 4.0–10.5)
nRBC: 0 % (ref 0.0–0.2)

## 2018-08-21 LAB — MAGNESIUM: Magnesium: 2.5 mg/dL — ABNORMAL HIGH (ref 1.7–2.4)

## 2018-08-21 LAB — BASIC METABOLIC PANEL
Anion gap: 10 (ref 5–15)
BUN: 8 mg/dL (ref 8–23)
CO2: 27 mmol/L (ref 22–32)
Calcium: 8.7 mg/dL — ABNORMAL LOW (ref 8.9–10.3)
Chloride: 103 mmol/L (ref 98–111)
Creatinine, Ser: 0.76 mg/dL (ref 0.44–1.00)
GFR calc Af Amer: >60 mL/min (ref >60)
GFR calc non Af Amer: >60 mL/min (ref >60)
Glucose, Bld: 151 mg/dL — ABNORMAL HIGH (ref 70–99)
Potassium: 4.2 mmol/L (ref 3.5–5.1)
Sodium: 140 mmol/L (ref 135–145)

## 2018-08-21 LAB — HIV ANTIBODY (ROUTINE TESTING W REFLEX): HIV Screen 4th Generation wRfx: NONREACTIVE

## 2018-08-21 LAB — STREP PNEUMONIAE URINARY ANTIGEN: Strep Pneumo Urinary Antigen: NEGATIVE

## 2018-08-21 MED ORDER — IPRATROPIUM-ALBUTEROL 0.5-2.5 (3) MG/3ML IN SOLN
3.0000 mL | Freq: Four times a day (QID) | RESPIRATORY_TRACT | Status: DC
  Administered 2018-08-21 (×3): 3 mL via RESPIRATORY_TRACT
  Filled 2018-08-21 (×3): qty 3

## 2018-08-21 MED ORDER — IPRATROPIUM-ALBUTEROL 0.5-2.5 (3) MG/3ML IN SOLN
3.0000 mL | Freq: Three times a day (TID) | RESPIRATORY_TRACT | Status: DC
  Administered 2018-08-22 – 2018-08-23 (×5): 3 mL via RESPIRATORY_TRACT
  Filled 2018-08-21 (×6): qty 3

## 2018-08-21 MED ORDER — AZITHROMYCIN 250 MG PO TABS
500.0000 mg | ORAL_TABLET | Freq: Every day | ORAL | Status: DC
  Administered 2018-08-21 – 2018-08-22 (×2): 500 mg via ORAL
  Filled 2018-08-21 (×2): qty 2

## 2018-08-21 NOTE — Progress Notes (Signed)
PROGRESS NOTE   Nicole Spence  BDZ:[id number]    DOB: 07/03/1946    DOA: 08/20/2018  PCP: Forrest Moron, MD   I have briefly reviewed patients previous medical records in [facility].  Brief Narrative:  72 year old female, lives alone, independent, PMH of COPD not on home oxygen, former smoker, HTN, presented to ED on 08/20/2018 due to productive cough, dyspnea, subjective fevers for which she had gone to an urgent care where she was found to be hypoxic with oxygen saturation in the 70s and was referred to the ED for evaluation.  She was admitted for community-acquired pneumonia, COPD exacerbation and acute hypoxic respiratory failure.   Assessment & Plan:   Principal Problem:   CAP (community acquired pneumonia) Active Problems:   Essential hypertension   COPD GOLD I   Hypokalemia   1. Lobar pneumonia (right upper lobe)/community-acquired pneumonia: Influenza panel PCR and urinary pneumococcal antigen negative.  Lactate and BNP normal.  Empirically started on ceftriaxone and azithromycin, continue.  Recommend repeating chest x-ray in 4 weeks to ensure resolution of pneumonia findings.  Blood cultures x2: Negative to date. 2. COPD exacerbation: Mild.  She received a dose of steroids in the ED.  No clinical bronchospasm.  Treat pneumonia as above.  Bronchodilators as needed. 3. Acute respiratory failure with hypoxia: Not on home oxygen.  Now precipitated by pneumonia and COPD exacerbation.  Remains on 5 L/min oxygen and saturating normally on same.  As discussed with nursing, wean oxygen as tolerated for saturations between 89-92%. 4. Hypokalemia: Replaced.  Magnesium 2.5.  HCTZ temporarily held. 5. Essential hypertension: Controlled.  HCTZ temporarily held.  PRN IV hydralazine.   DVT prophylaxis: Lovenox Code Status: Full Family Communication: Discussed in detail with patient's son at bedside. Disposition: DC home pending clinical improvement.  Still has oxygen requirement for  hypoxia.  Due to need for IV antibiotics, weaning off of oxygen, will need overnight close inpatient monitoring and management and hence will change to inpatient status.   Consultants:  None  Procedures:  None  Antimicrobials:  Ceftriaxone and azithromycin 12/27 >   Subjective: Feels better.  Stronger.  Cough is improved but not resolved and not much production at this time.  Hoarse voice.  Dyspnea on exertion.  ROS: As above, otherwise negative.  Objective:  Vitals:   08/21/18 0500 08/21/18 0606 08/21/18 0807 08/21/18 0810  BP:  134/76    Pulse:  68    Resp:  20    Temp:  97.8 F (36.6 C)    TempSrc:  Oral    SpO2:  97% 96% 96%  Weight: 86.5 kg     Height: 5' 0.5" (1.537 m)       Examination:  General exam: Pleasant elderly female, moderately built and nourished sitting up comfortably in bed. Respiratory system: Slightly diminished breath sounds in the bases with few basal crackles.  Rest of lung fields clear to auscultation without wheezing or rhonchi. Respiratory effort normal. Cardiovascular system: S1 & S2 heard, RRR. No JVD, murmurs, rubs, gallops or clicks. No pedal edema.  Telemetry personally reviewed: Sinus rhythm. Gastrointestinal system: Abdomen is nondistended, soft and nontender. No organomegaly or masses felt. Normal bowel sounds heard. Central nervous system: Alert and oriented. No focal neurological deficits. Extremities: Symmetric 5 x 5 power. Skin: No rashes, lesions or ulcers Psychiatry: Judgement and insight appear normal. Mood & affect appropriate.     Data Reviewed: I have personally reviewed following labs and imaging studies  CBC: Recent Labs  Lab 08/20/18 1908 08/21/18 0549  WBC 11.6* 7.7  NEUTROABS 7.8*  --   HGB 15.6* 14.0  HCT 48.0* 44.2  MCV 92.5 92.7  PLT 270 497   Basic Metabolic Panel: Recent Labs  Lab 08/20/18 1908 08/21/18 0549  NA 137 140  K 2.6* 4.2  CL 97* 103  CO2 27 27  GLUCOSE 118* 151*  BUN 10 8    CREATININE 0.95 0.76  CALCIUM 9.3 8.7*  MG  --  2.5*   Liver Function Tests: Recent Labs  Lab 08/20/18 1908  AST 26  ALT 18  ALKPHOS 77  BILITOT 0.7  PROT 8.9*  ALBUMIN 4.1     Recent Results (from the past 240 hour(s))  Blood Culture (routine x 2)     Status: None (Preliminary result)   Collection Time: 08/20/18  7:05 PM  Result Value Ref Range Status   Specimen Description   Final    BLOOD LEFT ANTECUBITAL Performed at [facility] [address], [place], [place] [address]    Special Requests   Final    BOTTLES DRAWN AEROBIC AND ANAEROBIC Blood Culture adequate volume Performed at [facility] [address], [place], [place] [address]    Culture   Final    NO GROWTH < 24 HOURS Performed at [facility] [address] [place], [place] [address]    Report Status PENDING  Incomplete  Blood Culture (routine x 2)     Status: None (Preliminary result)   Collection Time: 08/20/18  7:05 PM  Result Value Ref Range Status   Specimen Description   Final    BLOOD RIGHT ANTECUBITAL Performed at [facility] [address] [place], [place] [address]    Special Requests   Final    BOTTLES DRAWN AEROBIC AND ANAEROBIC Blood Culture adequate volume Performed at [facility] [address], [place], [place] [address]    Culture   Final    NO GROWTH < 24 HOURS Performed at [facility] [address], [place], [place] [address]    Report Status PENDING  Incomplete         Radiology Studies: Dg Chest 2 View  Result Date: 08/20/2018 CLINICAL DATA:  Constant dry cough congestion occasionally productive with clear sputum. EXAM: CHEST - 2 VIEW COMPARISON:  None. FINDINGS: The cardiopericardial silhouette is normal. Minimal aortic atherosclerosis is noted without aneurysm. Patchy airspace opacity in the anterior segment of right upper lobe is identified suspicious for pneumonia. No  overt pulmonary edema. No effusion. Mild degenerative change of the upper and midthoracic spine. IMPRESSION: Patchy airspace opacity in the anterior segment of the right upper lobe, suspicious for pneumonia. Electronically Signed   By: Ashley Royalty M.D.   On: 08/20/2018 18:32        Scheduled Meds: . azithromycin  500 mg Oral QHS  . budesonide (PULMICORT) nebulizer solution  0.25 mg Nebulization BID  . enoxaparin (LOVENOX) injection  40 mg Subcutaneous QHS  . famotidine  20 mg Oral BID  . fluticasone  2 spray Each Nare Daily  . ipratropium-albuterol  3 mL Nebulization Q6H  . potassium chloride SA  20 mEq Oral Daily   Continuous Infusions: . cefTRIAXone (ROCEPHIN)  IV Stopped (08/20/18 2135)     LOS: 0 days     Vernell Leep, MD, FACP, [facility]. [facility] Pager [phone]  If 7PM-7AM, please contact night-coverage [email or web address] Password [email or web address] 08/21/2018, 1:00 PM

## 2018-08-21 NOTE — Progress Notes (Signed)
PHARMACIST - PHYSICIAN COMMUNICATION DR:   Algis Liming  CONCERNING: Antibiotic IV to Oral Route Change Policy  RECOMMENDATION: This patient is receiving Zithromax by the intravenous route.  Based on criteria approved by the Pharmacy and Therapeutics Committee, the antibiotic(s) is/are being converted to the equivalent oral dose form(s).   DESCRIPTION: These criteria include:  Patient being treated for a respiratory tract infection, urinary tract infection, cellulitis or clostridium difficile associated diarrhea if on metronidazole  The patient is not neutropenic and does not exhibit a GI malabsorption state  The patient is eating (either orally or via tube) and/or has been taking other orally administered medications for a least 24 hours  The patient is improving clinically and has a Tmax < 100.5  If you have questions about this conversion, please contact the Pharmacy Department  []   [phone] )  [facility] []   [phone] )  [facility] []   [phone] )  [facility] []   [phone] )  [facility] [x]   [phone] )  [place], PharmD, BCPS 08/21/2018@7 :20 AM

## 2018-08-22 LAB — LEGIONELLA PNEUMOPHILA SEROGP 1 UR AG: L. pneumophila Serogp 1 Ur Ag: NEGATIVE

## 2018-08-22 LAB — EXPECTORATED SPUTUM ASSESSMENT W GRAM STAIN, RFLX TO RESP C

## 2018-08-22 MED ORDER — PREDNISONE 20 MG PO TABS
40.0000 mg | ORAL_TABLET | Freq: Every day | ORAL | Status: DC
  Administered 2018-08-22 – 2018-08-23 (×2): 40 mg via ORAL
  Filled 2018-08-22 (×2): qty 2

## 2018-08-22 MED ORDER — BUDESONIDE 0.25 MG/2ML IN SUSP
RESPIRATORY_TRACT | Status: AC
  Filled 2018-08-22: qty 2

## 2018-08-22 MED ORDER — IPRATROPIUM-ALBUTEROL 0.5-2.5 (3) MG/3ML IN SOLN
RESPIRATORY_TRACT | Status: AC
  Filled 2018-08-22: qty 3

## 2018-08-22 NOTE — Progress Notes (Signed)
PROGRESS NOTE   Nicole Spence  XBJ:[id number]    DOB: [birth year]    DOA: 08/20/2018  PCP: Forrest Moron, MD   I have briefly reviewed patients previous medical records in [facility]  Brief Narrative:  72 year old female, lives alone, independent, PMH of COPD not on home oxygen, former smoker, HTN, presented to ED on 08/20/2018 due to productive cough, dyspnea, subjective fevers for which she had gone to an urgent care where she was found to be hypoxic with oxygen saturation in the 70s and was referred to the ED for evaluation.  She was admitted for community-acquired pneumonia, COPD exacerbation and acute hypoxic respiratory failure.  Slowly improving but still significantly hypoxic.   Assessment & Plan:   Principal Problem:   CAP (community acquired pneumonia) Active Problems:   Essential hypertension   COPD GOLD I   Hypokalemia   1. Lobar pneumonia (right upper lobe)/community-acquired pneumonia: Influenza panel PCR and urinary pneumococcal antigen negative.  Lactate and BNP normal.  Empirically started on ceftriaxone and azithromycin, continue.  Recommend repeating chest x-ray in 4 weeks to ensure resolution of pneumonia findings.  Blood cultures x2: Negative to date.  Improving. 2. COPD exacerbation: Mild.  She received a dose of steroids in the ED. Treat pneumonia as above.  Bronchodilators as needed.  Although no significant clinical bronchospasm, given degree of hypoxia, started prednisone 40 daily and taper. 3. Acute respiratory failure with hypoxia: Not on home oxygen.  Now precipitated by pneumonia and COPD exacerbation.  As discussed with nursing, wean oxygen as tolerated for saturations between 89-92%.  Still quite hypoxic today even at rest, 86% on room air and with ambulation 75% on room air. 4. Hypokalemia: Replaced.  Magnesium 2.5.  HCTZ temporarily held. 5. Essential hypertension: Controlled.  HCTZ temporarily held.  PRN IV hydralazine.   DVT prophylaxis:  Lovenox Code Status: Full Family Communication: Discussed in detail with patient's son at bedside. Disposition: DC home pending clinical improvement.  Still has oxygen requirement for hypoxia.  Due to need for IV antibiotics, weaning off of oxygen, will need overnight close inpatient monitoring and management and hence will change to inpatient status.   Consultants:  None  Procedures:  None  Antimicrobials:  Ceftriaxone and azithromycin 12/27 >   Subjective: Continues to feel better.  Hoarseness improving.  Dyspnea about 50% improved.  Minimal dry cough.  No chest pain.  ROS: As above, otherwise negative.  Objective:  Vitals:   08/22/18 0518 08/22/18 0604 08/22/18 1300 08/22/18 1403  BP: 134/76  135/69   Pulse: 67  81   Resp: 18  (!) 22   Temp: 98 F (36.7 C)  98.4 F (36.9 C)   TempSrc: Oral  Oral   SpO2: 90% 99% 94% 94%  Weight:      Height:        Examination:  General exam: Pleasant elderly female, moderately built and nourished sitting up comfortably in bed. Respiratory system: Slightly harsh breath sounds bilaterally but no obvious wheezing, rhonchi or crackles.  No increased work of breathing. Cardiovascular system: S1 & S2 heard, RRR. No JVD, murmurs, rubs, gallops or clicks. No pedal edema.  Stable. Gastrointestinal system: Abdomen is nondistended, soft and nontender. No organomegaly or masses felt. Normal bowel sounds heard. Central nervous system: Alert and oriented. No focal neurological deficits. Extremities: Symmetric 5 x 5 power. Skin: No rashes, lesions or ulcers Psychiatry: Judgement and insight appear normal. Mood & affect appropriate.     Data Reviewed: I have  personally reviewed following labs and imaging studies  CBC: Recent Labs  Lab 08/20/18 1908 08/21/18 0549  WBC 11.6* 7.7  NEUTROABS 7.8*  --   HGB 15.6* 14.0  HCT 48.0* 44.2  MCV 92.5 92.7  PLT 270 497   Basic Metabolic Panel: Recent Labs  Lab 08/20/18 1908 08/21/18 0549    NA 137 140  K 2.6* 4.2  CL 97* 103  CO2 27 27  GLUCOSE 118* 151*  BUN 10 8  CREATININE 0.95 0.76  CALCIUM 9.3 8.7*  MG  --  2.5*   Liver Function Tests: Recent Labs  Lab 08/20/18 1908  AST 26  ALT 18  ALKPHOS 77  BILITOT 0.7  PROT 8.9*  ALBUMIN 4.1     Recent Results (from the past 240 hour(s))  Blood Culture (routine x 2)     Status: None (Preliminary result)   Collection Time: 08/20/18  7:05 PM  Result Value Ref Range Status   Specimen Description   Final    BLOOD LEFT ANTECUBITAL Performed at [facility] [address], [place], [place] [address]    Special Requests   Final    BOTTLES DRAWN AEROBIC AND ANAEROBIC Blood Culture adequate volume Performed at [facility] [address], [place], [place] [address]    Culture   Final    NO GROWTH 2 DAYS Performed at [facility] [address], [place], [place] [address]    Report Status PENDING  Incomplete  Blood Culture (routine x 2)     Status: None (Preliminary result)   Collection Time: 08/20/18  7:05 PM  Result Value Ref Range Status   Specimen Description   Final    BLOOD RIGHT ANTECUBITAL Performed at [facility] [address] [place], [place] [address]    Special Requests   Final    BOTTLES DRAWN AEROBIC AND ANAEROBIC Blood Culture adequate volume Performed at [facility] [address], [place], [place] [address]    Culture   Final    NO GROWTH 2 DAYS Performed at [facility] [address] [place], [place] [address]    Report Status PENDING  Incomplete  Culture, sputum-assessment     Status: None   Collection Time: 08/20/18 11:40 PM  Result Value Ref Range Status   Specimen Description SPUTUM  Final   Special Requests NONE  Final   Sputum evaluation   Final    THIS SPECIMEN IS ACCEPTABLE FOR SPUTUM CULTURE Performed at [facility], [place] [address] [place], [place] [address]     Report Status 08/22/2018 FINAL  Final         Radiology Studies: No results found.      Scheduled Meds: . azithromycin  500 mg Oral QHS  . budesonide (PULMICORT) nebulizer solution  0.25 mg Nebulization BID  . enoxaparin (LOVENOX) injection  40 mg Subcutaneous QHS  . famotidine  20 mg Oral BID  . fluticasone  2 spray Each Nare Daily  . ipratropium-albuterol  3 mL Nebulization TID  . potassium chloride SA  20 mEq Oral Daily   Continuous Infusions: . cefTRIAXone (ROCEPHIN)  IV 2 g (08/21/18 1836)     LOS: 1 day     Vernell Leep, MD, FACP, [facility]. [facility] Pager [phone]  If 7PM-7AM, please contact night-coverage [email or web address] Password [email or web address] 08/22/2018, 5:50 PM

## 2018-08-22 NOTE — Plan of Care (Signed)
Pt understands care plan.

## 2018-08-22 NOTE — Progress Notes (Addendum)
SATURATION QUALIFICATIONS: (This note is used to comply with regulatory documentation for home oxygen)  Patient Saturations on Room Air at Rest = 86%  Patient Saturations on Room Air while Ambulating = 75%  Patient Saturations on 3 Liters of oxygen while Ambulating = 85%  Please briefly explain why patient needs home oxygen: Saturation decreased on room air to 86% and then further decreased to 75%.  Sat on 2L while ambulating only 80% and 85% on 3L.  Increased to 92% on 3L after return to rest. Dr. Algis Liming notified. Andre Lefort

## 2018-08-23 DIAGNOSIS — J441 Chronic obstructive pulmonary disease with (acute) exacerbation: Secondary | ICD-10-CM

## 2018-08-23 DIAGNOSIS — J9601 Acute respiratory failure with hypoxia: Secondary | ICD-10-CM

## 2018-08-23 MED ORDER — AMOXICILLIN-POT CLAVULANATE 875-125 MG PO TABS: 1.0000 | ORAL_TABLET | Freq: Two times a day (BID) | ORAL | 0 refills | Status: AC

## 2018-08-23 MED ORDER — PREDNISONE 10 MG PO TABS: ORAL_TABLET | ORAL | 0 refills | Status: DC

## 2018-08-23 NOTE — Discharge Instructions (Signed)

## 2018-08-23 NOTE — Care Management Note (Signed)
Case Management Note  Patient Details  Name: Nicole Spence MRN: [id number] Date of Birth: 11/16/1945  Subjective/Objective:  CAP. COPD.From home. Qualifies for home 02. [facility] rep Santiago Glad following for home 02 if ordered.                  Action/Plan:d/c home   Expected Discharge Date:  (unknown)               Expected Discharge Plan:  Home/Self Care  In-House Referral:     Discharge planning Services  CM Consult  Post Acute Care Choice:    Choice offered to:  Patient  DME Arranged:  Oxygen DME Agency:  [place]:    [place] Agency:     Status of Service:  In process, will continue to follow  If discussed at Long Length of Stay Meetings, dates discussed:    Additional Comments:  Dessa Phi, RN 08/23/2018, 12:33 PM

## 2018-08-23 NOTE — Discharge Summary (Signed)
Physician Discharge Summary  Nicole Spence TIR:[id number] DOB: 1945/10/01  PCP: Forrest Moron, MD  Admit date: 08/20/2018 Discharge date: 08/23/2018  Recommendations for Outpatient Follow-up:  1. Dr. Delia Chimes, PCP in 3 to 5 days with repeat labs (CBC & BMP). 2. Dr. Christinia Gully, Pulmonology in 2 weeks.   3. Recommend repeating chest x-ray in 4 weeks to insure resolution of pneumonia findings.  Home Health: None Equipment/Devices: Oxygen via nasal cannula 4 L/min continuously.  Discharge Condition: Improved and stable CODE STATUS: Full Diet recommendation: Heart healthy diet  Discharge Diagnoses:  Principal Problem:   CAP (community acquired pneumonia) Active Problems:   Essential hypertension   COPD GOLD I   Hypokalemia   Brief Summary: 72 year old female, lives alone, independent, PMH of COPD not on home oxygen, former smoker, HTN, presented to ED on 08/20/2018 due to productive cough, dyspnea, subjective fevers for which she had gone to an urgent care where she was found to be hypoxic with oxygen saturation in the 70s and was referred to the ED for evaluation.  She was admitted for community-acquired pneumonia, COPD exacerbation and acute hypoxic respiratory failure.     Assessment & Plan:   1. Lobar pneumonia (right upper lobe)/community-acquired pneumonia: Influenza panel PCR and urinary pneumococcal and Legionella antigen negative.  Lactate and BNP normal.  Empirically started on ceftriaxone and azithromycin and completed 3 days course.  Recommend repeating chest x-ray in 4 weeks to ensure resolution of pneumonia findings.  Blood cultures x2: Negative to date.   Clinically improved. Transitioned to Augmentin to complete total 7 days course.  Sputum culture shows normal respiratory flora 2. COPD exacerbation:  She received a dose of IV Solu-Medrol in ED.  She did not have clinical bronchospasm after that and hence steroids were not continued.  However she remained  persistently hypoxic yesterday and thereby suspecting some degree of COPD exacerbation without wheezing or rhonchi, initiated oral prednisone taper.  Complete course of antibiotics and steroid taper as outpatient.  Feels much better.  Former heavy smoker.  Follows with Dr. Melvyn Novas, Pulmonology, states that she saw him on 9 December and has an appointment on 20 January. 3. Acute respiratory failure with hypoxia: Not on home oxygen PTA.  Now precipitated by pneumonia and COPD exacerbation.    Reassess today when patient meets requirement for home oxygen and will be discharged on 4 L/min. Close outpatient follow-up with PCP at which time can determine if she can be weaned off of oxygen. 4. Hypokalemia: Replaced.  Magnesium 2.5.   5. Essential hypertension: Controlled.  HCTZ temporarily held while hospitalized will be resumed at discharge.     Consultants:  None  Procedures:  None  Discharge Instructions  Discharge Instructions    Call MD for:  difficulty breathing, headache or visual disturbances   Complete by:  As directed    Call MD for:  extreme fatigue   Complete by:  As directed    Call MD for:  persistant dizziness or light-headedness   Complete by:  As directed    Call MD for:  temperature >100.4   Complete by:  As directed    Diet - low sodium heart healthy   Complete by:  As directed    Increase activity slowly   Complete by:  As directed        Medication List    TAKE these medications   albuterol 108 (90 Base) MCG/ACT inhaler Commonly known as:  PROVENTIL HFA;VENTOLIN HFA Inhale 2 puffs into the  lungs every 4 (four) hours as needed for wheezing or shortness of breath (cough, shortness of breath or wheezing.).   amoxicillin-clavulanate 875-125 MG tablet Commonly known as:  AUGMENTIN Take 1 tablet by mouth 2 (two) times daily for 4 days.   famotidine 20 MG tablet Commonly known as:  PEPCID One twice daily after bfast and after supper   fluticasone 50 MCG/ACT nasal  spray Commonly known as:  FLONASE USE 2 SPRAYS IN EACH  NOSTRIL DAILY   hydrochlorothiazide 25 MG tablet Commonly known as:  HYDRODIURIL Take 1 tablet (25 mg total) by mouth daily.   potassium chloride SA 20 MEQ tablet Commonly known as:  K-DUR,KLOR-CON Take 1 tablet (20 mEq total) by mouth daily.   predniSONE 10 MG tablet Commonly known as:  DELTASONE Take 4 tabs daily for 2 days, then 3 tabs daily for 2 days, then 2 tabs daily for 2 days, then 1 tab daily for 2 days, then stop. Start taking on:  August 24, 2018   Tiotropium Bromide-Olodaterol 2.5-2.5 MCG/ACT Aers Commonly known as:  STIOLTO RESPIMAT Inhale 2 puffs into the lungs daily.   WOMENS 50+ MULTI VITAMIN/MIN Tabs Take by mouth daily.      Follow-up Information    Forrest Moron, MD. Schedule an appointment as soon as possible for a visit.   Specialty:  Internal Medicine Why:  To be seen in 3 to 5 days with repeat labs (CBC & BMP).  Recommend repeating chest x-ray in 4 weeks to insure resolution of pneumonia findings. Contact information: [place] [address] [phone]        Tanda Rockers, MD. Schedule an appointment as soon as possible for a visit in 2 week(s).   Specialty:  Pulmonary Disease Contact information: [place] [address] [phone]          Allergies  Allergen Reactions  . Adhesive [Tape] Other (See Comments)    IRRITATES SKIN       Procedures/Studies: Dg Chest 2 View  Result Date: 08/20/2018 CLINICAL DATA:  Constant dry cough congestion occasionally productive with clear sputum. EXAM: CHEST - 2 VIEW COMPARISON:  None. FINDINGS: The cardiopericardial silhouette is normal. Minimal aortic atherosclerosis is noted without aneurysm. Patchy airspace opacity in the anterior segment of right upper lobe is identified suspicious for pneumonia. No overt pulmonary edema. No effusion. Mild degenerative change of the upper and midthoracic spine.  IMPRESSION: Patchy airspace opacity in the anterior segment of the right upper lobe, suspicious for pneumonia. Electronically Signed   By: Ashley Royalty M.D.   On: 08/20/2018 18:32      Subjective: She reports that she feels much better compared to yesterday.  Dyspnea significantly improved.  Mild dry cough.  No chest pain.  Eager to go home.  Discharge Exam:  Vitals:   08/22/18 2226 08/23/18 0529 08/23/18 0751 08/23/18 1208  BP: 118/83 134/83    Pulse: 67 72    Resp: 16 16    Temp: 98.1 F (36.7 C) 97.8 F (36.6 C)    TempSrc: Oral Oral    SpO2: 92% 94% 94% 94%  Weight:      Height:        General exam: Pleasant elderly female, moderately built and nourished seen ambulating comfortably in the room without distress. Respiratory system:  Much improved breath sounds.  Essentially clear to auscultation except occasional basal crackles.  No wheezing or rhonchi appreciated.  No increased work of breathing. Cardiovascular  system: S1 & S2 heard, RRR. No JVD, murmurs, rubs, gallops or clicks. No pedal edema.   Gastrointestinal system: Abdomen is nondistended, soft and nontender. No organomegaly or masses felt. Normal bowel sounds heard. Central nervous system: Alert and oriented. No focal neurological deficits. Extremities: Symmetric 5 x 5 power. Skin: No rashes, lesions or ulcers Psychiatry: Judgement and insight appear normal. Mood & affect appropriate.     The results of significant diagnostics from this hospitalization (including imaging, microbiology, ancillary and laboratory) are listed below for reference.     Microbiology: Recent Results (from the past 240 hour(s))  Blood Culture (routine x 2)     Status: None (Preliminary result)   Collection Time: 08/20/18  7:05 PM  Result Value Ref Range Status   Specimen Description   Final    BLOOD LEFT ANTECUBITAL Performed at [facility] [address] [place], [place] [address]    Special Requests   Final     BOTTLES DRAWN AEROBIC AND ANAEROBIC Blood Culture adequate volume Performed at [facility] [address] [place], [place] [address]    Culture   Final    NO GROWTH 2 DAYS Performed at [facility] [address] [place], [place] [address]    Report Status PENDING  Incomplete  Blood Culture (routine x 2)     Status: None (Preliminary result)   Collection Time: 08/20/18  7:05 PM  Result Value Ref Range Status   Specimen Description   Final    BLOOD RIGHT ANTECUBITAL Performed at [facility] [address] [place], [place] [address]    Special Requests   Final    BOTTLES DRAWN AEROBIC AND ANAEROBIC Blood Culture adequate volume Performed at [facility] [address] [place], [place] [address]    Culture   Final    NO GROWTH 2 DAYS Performed at [facility] [address] [place], [place] [address]    Report Status PENDING  Incomplete  Culture, sputum-assessment     Status: None   Collection Time: 08/20/18 11:40 PM  Result Value Ref Range Status   Specimen Description SPUTUM  Final   Special Requests NONE  Final   Sputum evaluation   Final    THIS SPECIMEN IS ACCEPTABLE FOR SPUTUM CULTURE Performed at [facility], [place] [address], [place], [place] [address]    Report Status 08/22/2018 FINAL  Final  Culture, respiratory     Status: None (Preliminary result)   Collection Time: 08/20/18 11:40 PM  Result Value Ref Range Status   Specimen Description   Final    SPUTUM Performed at [facility] [address] [place], [place] [address]    Special Requests   Final    NONE Reflexed from [id number] Performed at [facility] [address], [place], [place] [address]    Gram Stain   Final    FEW WBC PRESENT, PREDOMINANTLY PMN RARE SQUAMOUS EPITHELIAL CELLS PRESENT FEW GRAM POSITIVE COCCI Performed at [facility], [place] [address]  [place], [place] [address]    Culture FEW Consistent with normal respiratory flora.  Final   Report Status PENDING  Incomplete     Labs: CBC: Recent Labs  Lab 08/20/18 1908 08/21/18 0549  WBC 11.6* 7.7  NEUTROABS 7.8*  --   HGB 15.6* 14.0  HCT 48.0* 44.2  MCV 92.5 92.7  PLT 270 329   Basic Metabolic  Panel: Recent Labs  Lab 08/20/18 1908 08/21/18 0549  NA 137 140  K 2.6* 4.2  CL 97* 103  CO2 27 27  GLUCOSE 118* 151*  BUN 10 8  CREATININE 0.95 0.76  CALCIUM 9.3 8.7*  MG  --  2.5*   Liver Function Tests: Recent Labs  Lab 08/20/18 1908  AST 26  ALT 18  ALKPHOS 77  BILITOT 0.7  PROT 8.9*  ALBUMIN 4.1   BNP (last 3 results) Recent Labs    08/20/18 1903  BNP 34.4   Urinalysis    Component Value Date/Time   COLORURINE STRAW (A) 08/20/2018 2037   APPEARANCEUR CLEAR 08/20/2018 2037   LABSPEC 1.002 (L) 08/20/2018 2037   PHURINE 6.0 08/20/2018 2037   GLUCOSEU NEGATIVE 08/20/2018 2037   HGBUR SMALL (A) 08/20/2018 2037   BILIRUBINUR NEGATIVE 08/20/2018 2037   BILIRUBINUR moderate (A) 09/16/2016 1604   BILIRUBINUR small 03/14/2015 [place] 08/20/2018 2037   PROTEINUR NEGATIVE 08/20/2018 2037   UROBILINOGEN 0.2 09/16/2016 1604   NITRITE NEGATIVE 08/20/2018 2037   LEUKOCYTESUR NEGATIVE 08/20/2018 2037      Time coordinating discharge: 40 minutes  SIGNED:  Vernell Leep, MD, FACP, [facility]. [facility] Pager [phone]  If 7PM-7AM, please contact night-coverage [email or web address] Password [email or web address] 08/23/2018, 1:41 PM

## 2018-08-23 NOTE — Progress Notes (Signed)
SATURATION QUALIFICATIONS: (This note is used to comply with regulatory documentation for home oxygen)  Patient Saturations on Room Air at Rest = 85%  Patient Saturations on Room Air while Ambulating = 82%  Patient Saturations on 4 Liters of oxygen while Ambulating = 89%  Please briefly explain why patient needs home oxygen:

## 2018-08-25 LAB — CULTURE, BLOOD (ROUTINE X 2)
Culture: NO GROWTH
Culture: NO GROWTH
Special Requests: ADEQUATE
Special Requests: ADEQUATE

## 2018-08-25 LAB — CULTURE, RESPIRATORY W GRAM STAIN: Culture: NORMAL

## 2018-09-01 ENCOUNTER — Encounter: Payer: Self-pay | Admitting: Family Medicine

## 2018-09-01 ENCOUNTER — Other Ambulatory Visit: Payer: Self-pay

## 2018-09-01 ENCOUNTER — Ambulatory Visit (INDEPENDENT_AMBULATORY_CARE_PROVIDER_SITE_OTHER): Payer: Medicare Other | Admitting: Family Medicine

## 2018-09-01 VITALS — BP 147/84 | HR 115 | Temp 98.9°F | Resp 17 | Ht 60.05 in | Wt 186.0 lb

## 2018-09-01 DIAGNOSIS — R252 Cramp and spasm: Secondary | ICD-10-CM

## 2018-09-01 DIAGNOSIS — E876 Hypokalemia: Secondary | ICD-10-CM

## 2018-09-01 DIAGNOSIS — J189 Pneumonia, unspecified organism: Secondary | ICD-10-CM

## 2018-09-01 DIAGNOSIS — Z09 Encounter for follow-up examination after completed treatment for conditions other than malignant neoplasm: Secondary | ICD-10-CM

## 2018-09-01 DIAGNOSIS — J181 Lobar pneumonia, unspecified organism: Secondary | ICD-10-CM

## 2018-09-01 DIAGNOSIS — R0902 Hypoxemia: Secondary | ICD-10-CM

## 2018-09-01 LAB — POCT CBC
GRANULOCYTE PERCENT: 57.9 % (ref 37–80)
HCT, POC: 47.5 % — AB (ref 29–41)
Hemoglobin: 16.2 g/dL — AB (ref 11–14.6)
Lymph, poc: 3.9 — AB (ref 0.6–3.4)
MCH, POC: 30 pg (ref 27–31.2)
MCHC: 34 g/dL (ref 31.8–35.4)
MCV: 88 fL (ref 76–111)
MID (CBC): 0.7 (ref 0–0.9)
MPV: 7.5 fL (ref 0–99.8)
PLATELET COUNT, POC: 331 10*3/uL (ref 142–424)
POC Granulocyte: 6.3 (ref 2–6.9)
POC LYMPH PERCENT: 35.9 %L (ref 10–50)
POC MID %: 6.2 %M (ref 0–12)
RBC: 5.4 M/uL (ref 4.04–5.48)
RDW, POC: 14.8 %
WBC: 10.8 10*3/uL — AB (ref 4.6–10.2)

## 2018-09-01 MED ORDER — LORATADINE 10 MG PO TABS: 10.0000 mg | ORAL_TABLET | Freq: Every day | ORAL | 11 refills | Status: AC

## 2018-09-01 NOTE — Assessment & Plan Note (Signed)
Her wbc is slightly increased from discharge however vitals are stable.

## 2018-09-01 NOTE — Assessment & Plan Note (Signed)
With ambulation on 2L patient was noted to have tachycardia so she was advised to increase her oxygen via [place] to 3L with activity and 2L at rest.  She agrees to this and plans to keep her appt with Dr. Melvyn Novas (Pulmonolog).
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
## 2018-09-01 NOTE — Assessment & Plan Note (Signed)
Will check K levels as hypokalemia can cause the muscle cramps

## 2018-09-01 NOTE — Patient Instructions (Signed)
° ° ° °  If you have lab work done today you will be contacted with your lab results within the next 2 weeks.  If you have not heard from us then please contact us. The fastest way to get your results is to register for My Chart. ° ° °IF you received an x-ray today, you will receive an invoice from [facility]. Please contact  Radiology at [phone] with questions or concerns regarding your invoice.  ° °IF you received labwork today, you will receive an invoice from [facility]. Please contact [facility] at [phone] with questions or concerns regarding your invoice.  ° °Our billing staff will not be able to assist you with questions regarding bills from these companies. ° °You will be contacted with the lab results as soon as they are available. The fastest way to get your results is to activate your My Chart account. Instructions are located on the last page of this paperwork. If you have not heard from us regarding the results in 2 weeks, please contact this office. °  ° ° ° °

## 2018-09-02 LAB — BASIC METABOLIC PANEL
BUN / CREAT RATIO: 16 (ref 12–28)
BUN: 15 mg/dL (ref 8–27)
CO2: 24 mmol/L (ref 20–29)
Calcium: 10.6 mg/dL — ABNORMAL HIGH (ref 8.7–10.3)
Chloride: 96 mmol/L (ref 96–106)
Creatinine, Ser: 0.92 mg/dL (ref 0.57–1.00)
GFR calc Af Amer: 73 mL/min/{1.73_m2} (ref >59)
GFR calc non Af Amer: 63 mL/min/{1.73_m2} (ref >59)
Glucose: 91 mg/dL (ref 65–99)
Potassium: 3.7 mmol/L (ref 3.5–5.2)
Sodium: 139 mmol/L (ref 134–144)

## 2018-09-03 ENCOUNTER — Ambulatory Visit (INDEPENDENT_AMBULATORY_CARE_PROVIDER_SITE_OTHER): Payer: Medicare Other | Admitting: Internal Medicine

## 2018-09-03 ENCOUNTER — Encounter: Payer: Self-pay | Admitting: Internal Medicine

## 2018-09-03 DIAGNOSIS — J9611 Chronic respiratory failure with hypoxia: Secondary | ICD-10-CM

## 2018-09-03 DIAGNOSIS — J189 Pneumonia, unspecified organism: Secondary | ICD-10-CM

## 2018-09-03 DIAGNOSIS — J449 Chronic obstructive pulmonary disease, unspecified: Secondary | ICD-10-CM | POA: Diagnosis not present

## 2018-09-03 DIAGNOSIS — J181 Lobar pneumonia, unspecified organism: Secondary | ICD-10-CM | POA: Diagnosis not present

## 2018-09-03 MED ORDER — TIOTROPIUM BROMIDE-OLODATEROL 2.5-2.5 MCG/ACT IN AERS: 2.0000 | INHALATION_SPRAY | Freq: Every day | RESPIRATORY_TRACT | 0 refills | Status: DC

## 2018-09-03 NOTE — Progress Notes (Signed)
Subjective:    Patient ID: Nicole Spence, female   DOB: [birth year],    MRN: [id number]    Brief patient profile:  74  yobf  Quit smoking 2015 noted onset of doe age [age] with sense of nasal drainage/ sore throat and eventually placed on spiriva then added symbicort but actually much better since started reflux meds early April 2019 and referred to pulmonary clinic 12/18/2017 by Dr   Nicole Spence p ent eval around 2017 pos gerd but had stopped PPI due to reported potential side effects in media .    History of Present Illness  12/18/2017 1st [facility] office visit/ Nicole Spence   Chief Complaint  Patient presents with  . Pulmonary Consult    Referred by Dr. Windell Spence for eval of COPD. Pt states she was told years ago she had chronic bronchitis "but it never really bothered me". She has SOB with exertion such as cleaning her house or washing her car. She has an albuterol inhaler that she rarely uses.   doe indolent onset minimal progressive since age 68  Esp carrying groceries in from car has trouble with steps and walks slower than avg people never tries saba [facility] = can't walk a nl pace on a flat grade s sob but does fine slow and flat  Takes  symbicort x 2 first thing am/ then spriva lunchtime and then symb 160   Cough better on otc gerd rx  Sleep ok  rec Stop spiriva and symbicort  Plan A = Automatic = stiolto 2 pffs each am  And continue the omeprazole Take 30-60 min before first meal of the day  Work on inhaler technique:   Plan B = Backup Only use your albuterol as a rescue medication   GERD diet    01/29/2018  f/u ov/Nicole Spence re: GOLD I copd / stiolto 2 puffs each am and gerd rx  Chief Complaint  Patient presents with  . Follow-up    PFT's done today. Breathing has improved some. She states she has not had to use her albuterol inhaler.   Dyspnea:  Improved now = MMRC1 = can walk nl pace, flat grade, can't hurry or go uphills or steps s sob   Cough: none Sleep: ok  flat SABA use:   None  rec No change in medications  Please schedule a follow up visit in 6  months but call sooner if needed  - readdress 02 sats with ex/ ? Rehab next ov     08/02/2018  f/u ov/Nicole Spence re:  Copd I / maint on stiolto  Chief Complaint  Patient presents with  . Follow-up    Breathing has been worse for the past 2 wks. She has had to use her rescue inhaler once in the past 2 wks.     Dyspnea:  MMRC2 = can't walk a nl pace on a flat grade s sob but does fine slow and flat  / no steps/ no HC   Cough: no am congestion Sleeping: 3-4 pillows sometimes bed flat  SABA use: only when over does it  02: not using rec Pepcid (famotidine) 20 mg twice daily take after bfast and supper perfectly regularly GERD diet       Admit date: 08/20/2018 Discharge date: 08/23/2018  Recommendations for Outpatient Follow-up:  1. Dr. Delia Spence, PCP in 3 to 5 days with repeat labs (CBC & BMP). 2. Dr. Christinia Spence, Pulmonology in 2 weeks.   3. Recommend repeating chest x-ray  in 4 weeks to insure resolution of pneumonia findings.  Home Health: None Equipment/Devices: Oxygen via nasal cannula 4 L/min continuously.  Discharge Condition: Improved and stable CODE STATUS: Full Diet recommendation: Heart healthy diet  Discharge Diagnoses:  Principal Problem:   CAP (community acquired pneumonia) Active Problems:   Essential hypertension   COPD GOLD I   Hypokalemia   Brief Summary: 73 year old female, lives alone, independent, PMH of COPD not on home oxygen, former smoker, HTN, presented to ED on 08/20/2018 due to productive cough, dyspnea, subjective fevers for which she had gone to an urgent care where she was found to be hypoxic with oxygen saturation in the 70s and was referred to the ED for evaluation. She was admitted for community-acquired pneumonia, COPD exacerbation and acute hypoxic respiratory failure.   Assessment & Plan:  1. Lobar pneumonia (right upper  lobe)/community-acquired pneumonia:Influenza panel PCR and urinary pneumococcal and Legionella antigen negative. Lactate and BNP normal. Empirically started on ceftriaxone and azithromycin and completed 3 days course. Recommend repeating chest x-ray in 4 weeks to ensure resolution of pneumonia findings. Blood cultures x2: Negative to date.  Clinically improved. Transitioned to Augmentin to complete total 7 days course.  Sputum culture shows normal respiratory flora 2. COPD exacerbation: She received a dose of IV Solu-Medrol in ED.  She did not have clinical bronchospasm after that and hence steroids were not continued.  However she remained persistently hypoxic yesterday and thereby suspecting some degree of COPD exacerbation without wheezing or rhonchi, initiated oral prednisone taper.  Complete course of antibiotics and steroid taper as outpatient.  Feels much better.  Former heavy smoker.  Follows with Dr. [name], Pulmonology, states that she saw him on 9 December and has an appointment on 20 January. 3. Acute respiratory failure with hypoxia: Not on home oxygen PTA. Now precipitated by pneumonia and COPD exacerbation.   Reassess today when patient meets requirement for home oxygen and will be discharged on 4 L/min. Close outpatient follow-up with PCP at which time can determine if she can be weaned off of oxygen. 4. Hypokalemia:Replaced. Magnesium 2.5.  5. Essential hypertension:Controlled. HCTZ temporarily held while hospitalized will be resumed at discharge.      09/03/2018  f/u ov/Nicole Spence re: copd s/p pna/ off prednisone and abx x  5 days still using stiolto plus saba qid "they told me to at discharge"  Chief Complaint  Patient presents with  . Hospitalization Follow-up    Pt was admitted to hospital for PNA and COPD flare 08/20/18-08/23/18.  She was sent home on o2 and is currently using 2-3lpm 24/7.  She states her breathing has improved since hospital d/c. She has occ cough with  clear sputum but this is improving. She is having occ CP relieved by belching. She is using her albuterol inhaler 4 x daily.  Dyspnea:  MMRC2 = can't walk a nl pace on a flat grade s sob but does fine slow and flat eg [employer] s 02 but could feel it when got to car  Cough: minimal in am  Sleeping: bed blocks plus pillows  SABA use: 4 x daily  02: 2lpm 24/7   No obvious day to day or daytime variability or assoc excess/ purulent sputum or mucus plugs or hemoptysis or cp or chest tightness, subjective wheeze or overt sinus or hb symptoms.   Sleeping as above without nocturnal  or early am exacerbation  of respiratory  c/o's or need for noct saba. Also denies any obvious fluctuation of symptoms with  weather or environmental changes or other aggravating or alleviating factors except as outlined above   No unusual exposure hx or h/o childhood pna/ asthma or knowledge of premature birth.  Current Allergies, Complete Past Medical History, Past Surgical History, Family History, and Social History were reviewed in [employer] record.  ROS  The following are not active complaints unless bolded Hoarseness, sore throat, dysphagia, dental problems, itching, sneezing,  nasal congestion or discharge of excess mucus or purulent secretions, ear ache,   fever, chills, sweats, unintended wt loss or wt gain, classically pleuritic or exertional cp,  orthopnea pnd or arm/hand swelling  or leg swelling, presyncope, palpitations, abdominal pain, anorexia, nausea, vomiting, diarrhea  or change in bowel habits or change in bladder habits, change in stools or change in urine, dysuria, hematuria,  rash, arthralgias, visual complaints, headache, numbness, weakness or ataxia or problems with walking or coordination,  change in mood or  memory.        Current Meds  Medication Sig  . albuterol (PROVENTIL HFA;VENTOLIN HFA) 108 (90 Base) MCG/ACT inhaler Inhale 2 puffs into the lungs every 4 (four) hours as  needed for wheezing or shortness of breath (cough, shortness of breath or wheezing.).  Marland Kitchen famotidine (PEPCID) 20 MG tablet One twice daily after bfast and after supper  . fluticasone (FLONASE) 50 MCG/ACT nasal spray USE 2 SPRAYS IN EACH  NOSTRIL DAILY  . hydrochlorothiazide (HYDRODIURIL) 25 MG tablet Take 1 tablet (25 mg total) by mouth daily.  Marland Kitchen loratadine (CLARITIN) 10 MG tablet Take 1 tablet (10 mg total) by mouth daily.  . Multiple Vitamins-Minerals (WOMENS 50+ MULTI VITAMIN/MIN) TABS Take by mouth daily.  . OXYGEN 2lpm with rest and 3lpm with exertion A[facility]  . potassium chloride SA (K-DUR,KLOR-CON) 20 MEQ tablet Take 1 tablet (20 mEq total) by mouth daily.  . Tiotropium Bromide-Olodaterol (STIOLTO RESPIMAT) 2.5-2.5 MCG/ACT AERS Inhale 2 puffs into the lungs daily.                Objective:   Physical Exam  amb bf nad    09/03/2018          186  08/02/2018        189   01/29/2018         185   12/18/17 183 lb (83 kg)  11/18/17 183 lb (83 kg)  10/12/17 183 lb (83 kg)    Vital signs reviewed - Note on arrival 02 sats  98% on 2lpm continous       HEENT: nl dentition / oropharynx. Nl external ear canals without cough reflex -  Mild bilateral non-specific turbinate edema     NECK :  without JVD/Nodes/TM/ nl carotid upstrokes bilaterally   LUNGS: no acc muscle use,  Mild barrel  contour chest wall with bilateral  Distant bs s audible wheeze and  without cough on insp or exp maneuver and mild  Hyperresonant  to  percussion bilaterally     CV:  RRR  no s3 or murmur or increase in P2, and no edema   ABD:  soft and nontender with pos late  insp Hoover's  in the supine position. No bruits or organomegaly appreciated, bowel sounds nl  MS:   Nl gait/  ext warm without deformities, calf tenderness, cyanosis or clubbing No obvious joint restrictions   SKIN: warm and dry without lesions    NEURO:  alert, approp, nl sensorium with  no motor or cerebellar deficits apparent.  I personally reviewed images and agree with radiology impression as follows:  CXR:   08/10/18 Patchy airspace opacity in the anterior segment of the right upper lobe, suspicious for pneumonia.     Assessment:

## 2018-09-03 NOTE — Patient Instructions (Addendum)
Plan A = Automatic =  Stiolto 2 puffs each am   Work on inhaler technique:  relax and gently blow all the way out then take a nice smooth deep breath back in, triggering the inhaler at same time you start breathing in.  Hold for up to 5 seconds if you can.  Rinse and gargle with water when done      Plan B = Backup Only use your albuterol inhaler as a rescue medication to be used if you can't catch your breath by resting or doing a relaxed purse lip breathing pattern.  - The less you use it, the better it will work when you need it. - Ok to use the inhaler up to 2 puffs  every 4 hours if you must but call for appointment if use goes up over your usual need - Don't leave home without it !!  (think of it like the spare tire for your car)   02 2lpm at bedtime and not needed at rest sitting   When you walk for exercise then goal is 90% or higher so either walk slower or wear 02 adjusted to keep sats over 90%   Please see patient coordinator before you leave today  to schedule BEST FIT evaluation for ambulatory 02   Please schedule a follow up office visit in 6 weeks, call sooner if needed

## 2018-09-04 ENCOUNTER — Encounter: Payer: Self-pay | Admitting: Internal Medicine

## 2018-09-04 NOTE — Assessment & Plan Note (Addendum)
Clinically resolved, plans f/u with PCP for cxr and no further abx indicated in meantime    I had an extended discussion with the patient reviewing all relevant studies completed to date and  lasting 15 to 20 minutes of a 25 minute visit    See  amb 02 study  which extended face to face time for this visit.  Each maintenance medication was reviewed in detail including emphasizing most importantly the difference between maintenance and prns and under what circumstances the prns are to be triggered using an action plan format that is not reflected in the computer generated alphabetically organized AVS which I have not found useful in most complex patients, especially with respiratory illnesses  Please see AVS for specific instructions unique to this visit that I personally wrote and verbalized to the the pt in detail and then reviewed with pt  by my nurse highlighting any  changes in therapy recommended at today's visit to their plan of care.

## 2018-09-04 NOTE — Assessment & Plan Note (Signed)
08/02/2018 Patient Saturations on Room Air at Rest = 93%  >> Room Air while Ambulating = 83% Patient Saturations on 2 Liters of oxygen while Ambulating = 94%>  rx 2lpm with activity  - placed on 24 h 02 at d/c from [facility] p dx pna 08/23/18 - refer for BEST fit for amb 02  09/03/2018  - 09/03/2018   Walked RA x one and half  lap =300 - stopped due to  Sob and desat to 87% corrected on 2lpm at avg pace so rec 02 2lpm hs and with exertion and referred to dme for BEST fit for amb 02

## 2018-09-04 NOTE — Assessment & Plan Note (Addendum)
Quit smoking 2015 - 12/18/2017  Walked RA x 3 laps @ 185 ft each stopped due to  End of study, fast pace, sats 90% at end  -  Spirometry 12/18/2017  FEV1 1.63 (83%)  Ratio 65 - 12/18/2017  > changed from  symbicort /spiriva dpi to stiolto - PFT's  01/29/2018  FEV1 1.81  (87 % ) ratio 67  p 16 % improvement from saba p nothing prior to study with DLCO  32 % corrects to 46  % for alv volume    - 09/03/2018  After extensive coaching inhaler device,  effectiveness =    90% with smi    Pt is Group B in terms of symptom/risk and laba/lama therefore appropriate rx at this point > continue stiolto but use saba prn, not as maint  I spent extra time with pt today reviewing appropriate use of albuterol for prn use on exertion with the following points: 1) saba is for relief of sob that does not improve by walking a slower pace or resting but rather if the pt does not improve after trying this first. 2) If the pt is convinced, as many are, that saba helps recover from activity faster then it's easy to tell if this is the case by re-challenging : ie stop, take the inhaler, then p 5 minutes try the exact same activity (intensity of workload) that just caused the symptoms and see if they are substantially diminished or not after saba 3) if there is an activity that reproducibly causes the symptoms, try the saba 15 min before the activity on alternate days   If in fact the saba really does help, then fine to continue to use it prn but advised may need to look closer at the maintenance regimen being used to achieve better control of airways disease with exertion.

## 2018-09-07 ENCOUNTER — Telehealth: Payer: Self-pay | Admitting: Internal Medicine

## 2018-09-07 DIAGNOSIS — J449 Chronic obstructive pulmonary disease, unspecified: Secondary | ICD-10-CM

## 2018-09-07 NOTE — Telephone Encounter (Signed)
Spoke with pt, I advised her I would call [facility] in the morning. I thought [facility] did not have POC right now. I pended order for oxygen.

## 2018-09-07 NOTE — Telephone Encounter (Signed)
Apologize as it appears we dont' have our work flow optimized yet  My note addresses this already: - 09/03/2018   Walked RA x one and half  lap =311ft - stopped due to  Sob and desat to 87% corrected on 2lpm at avg pace so rec 02 2lpm hs and with exertion and referred to dme for BEST fit for amb 02   If can't do "best fit" as am order then  req ambulatory 02 titration to see if eligible for POC or just order portable 02 2lpm if all else fails

## 2018-09-07 NOTE — Telephone Encounter (Signed)
Called and spoke with patient, she stated that she was supposed to be contacted about a portable oxygen tank to have with her. Patient stated that she was supposed to see the [facility] Friday before she left but no one came back out to the lobby to get her. When looking at orders from the visit Friday there was an order placed for pulmonary rehab however the AVS instructions state this below.     02 2lpm at bedtime and not needed at rest sitting   When you walk for exercise then goal is 90% or higher so either walk slower or wear 02 adjusted to keep sats over 90%   Please see patient coordinator before you leave today  to schedule BEST FIT evaluation for ambulatory 02   Please schedule a follow up office visit in 6 weeks, call sooner if needed  ____________________________________________________________________________________  MW please advise on what needs to be ordered for the patient. Thank you.

## 2018-09-08 NOTE — Telephone Encounter (Signed)
Order has been placed for patient to have best fit o2. Nothing further needed.

## 2018-09-09 ENCOUNTER — Telehealth (HOSPITAL_COMMUNITY): Payer: Self-pay

## 2018-09-09 NOTE — Telephone Encounter (Signed)
Referral received from [facility] for Pulmonary Rehab with diagnosis of COPD mixed type (GOLD I). Clinical review of pt follow up appt on 09/03/18 Pulmonary office note. Pt appropriate for scheduling for Pulmonary rehab.  Will forward to support staff for scheduling and verification of insurance eligibility/benefits with pt consent.   Joycelyn Man, RN, BSN Cardiac and Pulmonary Rehab Nurse

## 2018-09-13 ENCOUNTER — Ambulatory Visit: Payer: Medicare Other | Admitting: Internal Medicine

## 2018-09-16 ENCOUNTER — Telehealth: Payer: Self-pay | Admitting: Family Medicine

## 2018-09-16 NOTE — Telephone Encounter (Signed)
Spoke with pt advised to keep 8 am appt with [facility] with Almyra Free and then she will see stallings at 10:00am.  Pt agreeable. Dgaddy, CMA

## 2018-09-16 NOTE — Telephone Encounter (Signed)
Copied from [place] [phone]. Topic: Appointment Scheduling - Scheduling Inquiry for Clinic >> Sep 16, 2018 11:39 AM Nicole Spence, NT wrote: Reason for CRM: patient is calling in regards to her appointments on Monday 09/20/18. She states that she receive a call last week that states the health coach nurse would not be there at that appointment and she was going to see if Dr. Nolon Rod could do everything at her 10:00am appointment. Patient is still on the schedule for health coach at 8:00am and is not sure if this needs to be canceled or not. Please contact the patient.

## 2018-09-20 ENCOUNTER — Ambulatory Visit: Payer: Medicare Other

## 2018-09-20 ENCOUNTER — Other Ambulatory Visit: Payer: Self-pay

## 2018-09-20 ENCOUNTER — Ambulatory Visit (INDEPENDENT_AMBULATORY_CARE_PROVIDER_SITE_OTHER): Payer: Medicare Other | Admitting: Family Medicine

## 2018-09-20 ENCOUNTER — Encounter: Payer: Self-pay | Admitting: Family Medicine

## 2018-09-20 VITALS — BP 136/79 | HR 91 | Temp 97.5°F | Resp 17 | Ht 65.0 in | Wt 184.4 lb

## 2018-09-20 DIAGNOSIS — Z0001 Encounter for general adult medical examination with abnormal findings: Secondary | ICD-10-CM

## 2018-09-20 DIAGNOSIS — Z0000 Encounter for general adult medical examination without abnormal findings: Secondary | ICD-10-CM

## 2018-09-20 DIAGNOSIS — L219 Seborrheic dermatitis, unspecified: Secondary | ICD-10-CM

## 2018-09-20 DIAGNOSIS — Z23 Encounter for immunization: Secondary | ICD-10-CM

## 2018-09-20 DIAGNOSIS — R7302 Impaired glucose tolerance (oral): Secondary | ICD-10-CM | POA: Diagnosis not present

## 2018-09-20 DIAGNOSIS — J449 Chronic obstructive pulmonary disease, unspecified: Secondary | ICD-10-CM

## 2018-09-20 DIAGNOSIS — I10 Essential (primary) hypertension: Secondary | ICD-10-CM

## 2018-09-20 LAB — POCT URINALYSIS DIP (MANUAL ENTRY)
BILIRUBIN UA: NEGATIVE
Blood, UA: NEGATIVE
Glucose, UA: NEGATIVE mg/dL
Ketones, POC UA: NEGATIVE mg/dL
Leukocytes, UA: NEGATIVE
Nitrite, UA: NEGATIVE
Spec Grav, UA: 1.015 (ref 1.010–1.025)
Urobilinogen, UA: 0.2 E.U./dL
pH, UA: 7 (ref 5.0–8.0)

## 2018-09-20 LAB — POCT GLYCOSYLATED HEMOGLOBIN (HGB A1C): Hemoglobin A1C: 6.6 % — AB (ref 4.0–5.6)

## 2018-09-20 MED ORDER — HYDROCHLOROTHIAZIDE 25 MG PO TABS: 25.0000 mg | ORAL_TABLET | Freq: Every day | ORAL | 3 refills | Status: DC

## 2018-09-20 MED ORDER — POTASSIUM CHLORIDE CRYS ER 20 MEQ PO TBCR: 20.0000 meq | EXTENDED_RELEASE_TABLET | Freq: Every day | ORAL | 3 refills | Status: DC

## 2018-09-20 NOTE — Patient Instructions (Signed)
Health Maintenance After Age 71 After age 71, you are at a higher risk for certain long-term diseases and infections as well as injuries from falls. Falls are a major cause of broken bones and head injuries in people who are older than age 71. Getting regular preventive care can help to keep you healthy and well. Preventive care includes getting regular testing and making lifestyle changes as recommended by your health care provider. Talk with your health care provider about:  Which screenings and tests you should have. A screening is a test that checks for a disease when you have no symptoms.  A diet and exercise plan that is right for you. What should I know about screenings and tests to prevent falls? Screening and testing are the best ways to find a health problem early. Early diagnosis and treatment give you the best chance of managing medical conditions that are common after age 71. Certain conditions and lifestyle choices may make you more likely to have a fall. Your health care provider may recommend:  Regular vision checks. Poor vision and conditions such as cataracts can make you more likely to have a fall. If you wear glasses, make sure to get your prescription updated if your vision changes.  Medicine review. Work with your health care provider to regularly review all of the medicines you are taking, including over-the-counter medicines. Ask your health care provider about any side effects that may make you more likely to have a fall. Tell your health care provider if any medicines that you take make you feel dizzy or sleepy.  Osteoporosis screening. Osteoporosis is a condition that causes the bones to get weaker. This can make the bones weak and cause them to break more easily.  Blood pressure screening. Blood pressure changes and medicines to control blood pressure can make you feel dizzy.  Strength and balance checks. Your health care provider may recommend certain tests to check your  strength and balance while standing, walking, or changing positions.  Foot health exam. Foot pain and numbness, as well as not wearing proper footwear, can make you more likely to have a fall.  Depression screening. You may be more likely to have a fall if you have a fear of falling, feel emotionally low, or feel unable to do activities that you used to do.  Alcohol use screening. Using too much alcohol can affect your balance and may make you more likely to have a fall. What actions can I take to lower my risk of falls? General instructions  Talk with your health care provider about your risks for falling. Tell your health care provider if: ? You fall. Be sure to tell your health care provider about all falls, even ones that seem minor. ? You feel dizzy, sleepy, or off-balance.  Take over-the-counter and prescription medicines only as told by your health care provider. These include any supplements.  Eat a healthy diet and maintain a healthy weight. A healthy diet includes low-fat dairy products, low-fat (lean) meats, and fiber from whole grains, beans, and lots of fruits and vegetables. Home safety  Remove any tripping hazards, such as rugs, cords, and clutter.  Install safety equipment such as grab bars in bathrooms and safety rails on stairs.  Keep rooms and walkways well-lit. Activity   Follow a regular exercise program to stay fit. This will help you maintain your balance. Ask your health care provider what types of exercise are appropriate for you.  If you need a cane or   walker, use it as recommended by your health care provider.  Wear supportive shoes that have nonskid soles. Lifestyle  Do not drink alcohol if your health care provider tells you not to drink.  If you drink alcohol, limit how much you have: ? 0-1 drink a day for women. ? 0-2 drinks a day for men.  Be aware of how much alcohol is in your drink. In the [place] one drink equals one typical bottle of beer (12  oz), one-half glass of wine (5 oz), or one shot of hard liquor (1 oz).  Do not use any products that contain nicotine or tobacco, such as cigarettes and e-cigarettes. If you need help quitting, ask your health care provider. Summary  Having a healthy lifestyle and getting preventive care can help to protect your health and wellness after age 71.  Screening and testing are the best way to find a health problem early and help you avoid having a fall. Early diagnosis and treatment give you the best chance for managing medical conditions that are more common for people who are older than age 71.  Falls are a major cause of broken bones and head injuries in people who are older than age 71. Take precautions to prevent a fall at home.  Work with your health care provider to learn what changes you can make to improve your health and wellness and to prevent falls. This information is not intended to replace advice given to you by your health care provider. Make sure you discuss any questions you have with your health care provider. Document Released: 06/24/2017 Document Revised: 06/24/2017 Document Reviewed: 06/24/2017 [employer] Interactive Patient Education  2019 [employer]  
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
## 2018-09-28 ENCOUNTER — Telehealth (HOSPITAL_COMMUNITY): Payer: Self-pay

## 2018-09-28 NOTE — Telephone Encounter (Signed)
Pt insurance is active and benefits verified through Medicare A&B Co-pay 0, DED 198.00/0 met, out of pocket 0/0 met, co-insurance 20%. no pre-authorization required. 09/28/2018 @ 10:23am

## 2018-09-28 NOTE — Telephone Encounter (Signed)
Called patient to see if she was interested in participating in the Pulmonary Rehab Program. Patient stated yes. Patient will come in for orientation on 10/11/2018 @ 9AM and will attend the 130PM exercise class.  Went over insurance, patient verbalized understanding.   Mailed homework package.

## 2018-09-28 NOTE — Telephone Encounter (Signed)
Pt has secondary insurance coverage through [place] copay: 0 ded: 0 out of pocket: 0 coinsurance: 0% 09/28/2018 10:23

## 2018-09-29 NOTE — Addendum Note (Signed)
Addended by: Delia Chimes A on: 09/29/2018 04:57 PM   Modules accepted: Level of Service

## 2018-10-11 ENCOUNTER — Encounter (HOSPITAL_COMMUNITY): Admission: RE | Admit: 2018-10-11 | Payer: Medicare Other | Source: Ambulatory Visit

## 2018-10-11 ENCOUNTER — Telehealth: Payer: Self-pay | Admitting: Internal Medicine

## 2018-10-11 ENCOUNTER — Encounter (HOSPITAL_COMMUNITY)
Admission: RE | Admit: 2018-10-11 | Discharge: 2018-10-11 | Disposition: A | Discharge status: Home / Self Care | Payer: Medicare Other | Source: Ambulatory Visit | Attending: Internal Medicine | Admitting: Internal Medicine

## 2018-10-11 ENCOUNTER — Telehealth (HOSPITAL_COMMUNITY): Payer: Self-pay

## 2018-10-11 ENCOUNTER — Encounter (HOSPITAL_COMMUNITY): Payer: Self-pay

## 2018-10-11 VITALS — BP 144/73 | HR 103 | Temp 98.4°F | Resp 18 | Ht 65.5 in | Wt 190.5 lb

## 2018-10-11 DIAGNOSIS — J9611 Chronic respiratory failure with hypoxia: Secondary | ICD-10-CM

## 2018-10-11 DIAGNOSIS — R0600 Dyspnea, unspecified: Secondary | ICD-10-CM | POA: Diagnosis not present

## 2018-10-11 HISTORY — DX: Pneumonia, unspecified organism: J18.9

## 2018-10-11 NOTE — Telephone Encounter (Signed)
The order was put in electronically. They just need you to sign the order via Epic. Ria Comment at [facility] is aware that the order has been placed.

## 2018-10-11 NOTE — Telephone Encounter (Signed)
Done

## 2018-10-11 NOTE — Telephone Encounter (Signed)
Use doe / don't see anything on my desk to sign

## 2018-10-11 NOTE — Progress Notes (Signed)
Nicole Spence 73 y.o. female Pulmonary Rehab Orientation Note Pt referred to Pulmonary rehab by Dr. Melvyn Novas with the diagnosis of dyspnea and chronic respiratory failure with hypoxia. Patient arrived today in Cardiac and Pulmonary Rehab for orientation to Pulmonary Rehab. She was transported from [employer] via wheel chair by rehab staff. She does carry portable oxygen. Per pt, she uses oxygen at night when she is sleeping and she states she will also wear it when she get really short of breath, but does admit she does not wear it as she is supposed to. Color good, skin warm and dry. Patient is oriented to time and place. Patient's medical history, psychosocial health, and medications reviewed. Psychosocial assessment reveals pt lives alone. Pt is currently retired. Pt hobbies include knitting and crocheting as well as jewelry making, reading and playing computer games. Pt reports her stress level is low. She states she does not have much stress. She states she is financially stable and has a good support system with her children, grandchildren and great-grandchildren as well as her multiple friend groups. Pt does not exhibit signs of depression. PHQ2/9 score 0/2. Pt shows good  coping skills with positive outlook. Will continue to monitor and evaluate progress toward psychosocial goal(s) of maintaining a positive and healthy outlook while remaining free of psychosocial barriers. Physical assessment reveals heart rate is normal, breath sounds clear to auscultation, no wheezes, rales, or rhonchi, diminished. Grip strength equal, strong. Distal pulses present. Patient reports she does take medications as prescribed. Patient states she follows a Regular diet and states that chips and sweets are her weakness. The patient reports no specific efforts to gain or lose weight. Pt does state she wants to lose weight, overall 45 lbs, but short term 5-10 lbs.Patient's weight will be monitored closely. Demonstration and  practice of PLB. Patient able to return demonstration satisfactorily. Safety and hand hygiene in the exercise area reviewed with patient. Patient voices understanding of the information reviewed. Department expectations discussed with patient and achievable goals were set. The patient shows enthusiasm about attending the program and we look forward to working with this nice lady. The patient is scheduled for a 6 min walk test on 10/14/18 at 3:00 pm and to begin exercise on 10/21/18 with the 1:30 class.   [id number]  Joycelyn Man RN, BSN Cardiac and Pulmonary Rehab RN

## 2018-10-11 NOTE — Telephone Encounter (Signed)
Pt insurance is active and benefits verified through [employer]B Co-pay $0.00, DED $198.00/0 met, out of pocket 0.00/0 met, co-insurance 20%. No pre-authorization required.  Will contact patient to see if she is interested in the Pulmonary [place]. Support Rep II

## 2018-10-11 NOTE — Telephone Encounter (Signed)
Call made to [facility], spoke with Ria Comment, she states she needs a pulm rehab order placed under a different DX due to patient having Medicare and no pre and post spiro in epic. Requesting order be placed under respiratory care services for dyspnea. Order placed. Ria Comment would like a call back once order has been signed.   MW please sign as soon as possible as patient is at facility. Thanks.

## 2018-10-14 ENCOUNTER — Encounter (HOSPITAL_COMMUNITY)
Admission: RE | Admit: 2018-10-14 | Discharge: 2018-10-14 | Disposition: A | Discharge status: Home / Self Care | Payer: Medicare Other | Source: Ambulatory Visit | Attending: Internal Medicine | Admitting: Internal Medicine

## 2018-10-14 ENCOUNTER — Ambulatory Visit (HOSPITAL_COMMUNITY): Payer: Medicare Other

## 2018-10-14 DIAGNOSIS — J9611 Chronic respiratory failure with hypoxia: Secondary | ICD-10-CM

## 2018-10-14 DIAGNOSIS — R0600 Dyspnea, unspecified: Secondary | ICD-10-CM | POA: Diagnosis not present

## 2018-10-14 NOTE — Progress Notes (Signed)
Pulmonary Individual Treatment Plan  Patient Details  Name: Nicole Spence MRN: [id number] Date of Birth: 02/22/1946 Referring Provider:     Pulmonary Rehab Walk Test from 10/14/2018 in [place]  Referring Provider  Dr. Melvyn Novas      Initial Encounter Date:    Pulmonary Rehab Walk Test from 10/14/2018 in [place]  Date  10/14/18      Visit Diagnosis: Dyspnea, unspecified type  Chronic respiratory failure with hypoxia ([place])  Patient's Home Medications on Admission:   Current Outpatient Medications:  .  albuterol (PROVENTIL HFA;VENTOLIN HFA) 108 (90 Base) MCG/ACT inhaler, Inhale 2 puffs into the lungs every 4 (four) hours as needed for wheezing or shortness of breath (cough, shortness of breath or wheezing.)., Disp: 1 Inhaler, Rfl: 1 .  famotidine (PEPCID) 20 MG tablet, One twice daily after bfast and after supper, Disp: 180 tablet, Rfl: 3 .  fluticasone (FLONASE) 50 MCG/ACT nasal spray, USE 2 SPRAYS IN EACH  NOSTRIL DAILY, Disp: 48 g, Rfl: 2 .  hydrochlorothiazide (HYDRODIURIL) 25 MG tablet, Take 1 tablet (25 mg total) by mouth daily., Disp: 90 tablet, Rfl: 3 .  loratadine (CLARITIN) 10 MG tablet, Take 1 tablet (10 mg total) by mouth daily., Disp: 30 tablet, Rfl: 11 .  Multiple Vitamins-Minerals (WOMENS 50+ MULTI VITAMIN/MIN) TABS, Take by mouth daily., Disp: , Rfl:  .  OXYGEN, 2lpm with rest and 3lpm with exertion AHC, Disp: , Rfl:  .  potassium chloride SA (K-DUR,KLOR-CON) 20 MEQ tablet, Take 1 tablet (20 mEq total) by mouth daily., Disp: 90 tablet, Rfl: 3 .  Tiotropium Bromide-Olodaterol (STIOLTO RESPIMAT) 2.5-2.5 MCG/ACT AERS, Inhale 2 puffs into the lungs daily., Disp: 1 Inhaler, Rfl: 0  Past Medical History: Past Medical History:  Diagnosis Date  . Cataract   . Colon polyps 02/09/2007   [facility]; colonoscopy.  Marland Kitchen COPD (chronic obstructive pulmonary disease) ([place])   . Diverticula of colon 02/09/2007  . Glucose  intolerance (impaired glucose tolerance)   . Hypertension   . Internal hemorrhoids 02/09/2007.  Marland Kitchen Pneumonia 07/2018    Tobacco Use: Social History   Tobacco Use  Smoking Status Former Smoker  . Packs/day: 1.00  . Years: 30.00  . Pack years: 30.00  . Types: Cigarettes  . Last attempt to quit: 10/23/2013  . Years since quitting: 4.9  Smokeless Tobacco Never Used    Labs: Recent Review Flowsheet Data    Labs for ITP Cardiac and Pulmonary Rehab Latest Ref Rng & Units 03/17/2017 09/17/2017 03/22/2018 08/20/2018 09/20/2018   Cholestrol 100 - 199 mg/dL 163 175 157 - -   LDLCALC 0 - 99 mg/dL 85 94 84 - -   HDL >39 mg/dL 53 58 52 - -   Trlycerides 0 - 149 mg/dL 125 116 104 - -   Hemoglobin A1c 4.0 - 5.6 % 6.4(H) - 6.3(H) - 6.6(A)   HCO3 20.0 - 28.0 mmol/L - - - 29.7(H) -   O2SAT % - - - 68.6 -      Capillary Blood Glucose: No results found for: GLUCAP   Pulmonary Assessment Scores: Pulmonary Assessment Scores    Row Name 10/11/18 1411 10/14/18 1309       ADL UCSD   ADL Phase  Entry  Entry    SOB Score total  53  -      CAT Score   CAT Score  20  -      mMRC Score   mMRC Score  -  1       Pulmonary Function Assessment: Pulmonary Function Assessment - 10/11/18 1411      Breath   Bilateral Breath Sounds  Decreased;Clear    Shortness of Breath  Yes       Exercise Target Goals: Exercise Program Goal: Individual exercise prescription set using results from initial 6 min walk test and THRR while considering  patient's activity barriers and safety.   Exercise Prescription Goal: Initial exercise prescription builds to 30-45 minutes a day of aerobic activity, 2-3 days per week.  Home exercise guidelines will be given to patient during program as part of exercise prescription that the participant will acknowledge.  Activity Barriers & Risk Stratification: Activity Barriers & Cardiac Risk Stratification - 10/11/18 1403      Activity Barriers & Cardiac Risk Stratification     Activity Barriers  Arthritis;Back Problems;Shortness of Breath;History of Falls       6 Minute Walk: 6 Minute Walk    Row Name 10/14/18 1302         6 Minute Walk   Phase  Initial     Distance  1250 feet     Walk Time  6 minutes     # of Rest Breaks  0     MPH  2.36     METS  2.84     RPE  12     Perceived Dyspnea   0     Symptoms  Yes (comment)     Comments  used wheelchair     Resting HR  97 bpm     Resting BP  124/68     Resting Oxygen Saturation   97 %     Exercise Oxygen Saturation  during 6 min walk  91 %     Max Ex. HR  129 bpm     Max Ex. BP  140/84       Interval HR   1 Minute HR  117     2 Minute HR  116     3 Minute HR  121     4 Minute HR  123     5 Minute HR  123     6 Minute HR  129     2 Minute Post HR  121     Interval Heart Rate?  Yes       Interval Oxygen   Interval Oxygen?  Yes     Baseline Oxygen Saturation %  97 %     1 Minute Oxygen Saturation %  94 %     1 Minute Liters of Oxygen  3 L     2 Minute Oxygen Saturation %  93 %     2 Minute Liters of Oxygen  3 L     3 Minute Oxygen Saturation %  92 %     3 Minute Liters of Oxygen  3 L     4 Minute Oxygen Saturation %  91 %     4 Minute Liters of Oxygen  3 L     5 Minute Oxygen Saturation %  92 %     5 Minute Liters of Oxygen  3 L     6 Minute Oxygen Saturation %  91 %     6 Minute Liters of Oxygen  3 L     2 Minute Post Oxygen Saturation %  94 %     2 Minute Post Liters of Oxygen  3 L  Oxygen Initial Assessment: Oxygen Initial Assessment - 10/14/18 1309      Initial 6 min Walk   Oxygen Used  Continuous;E-Tanks    Liters per minute  3      Program Oxygen Prescription   Program Oxygen Prescription  Continuous;E-Tanks    Liters per minute  3       Oxygen Re-Evaluation:   Oxygen Discharge (Final Oxygen Re-Evaluation):   Initial Exercise Prescription: Initial Exercise Prescription - 10/14/18 1300      Date of Initial Exercise RX and Referring Provider   Date   10/14/18    Referring Provider  Dr. Melvyn Novas      Oxygen   Oxygen  Continuous    Liters  3      Bike   Level  0.4    Minutes  17    METs  1.5      NuStep   Level  2    SPM  80    Minutes  17    METs  1.5      Track   Laps  10    Minutes  17      Prescription Details   Frequency (times per week)  2    Duration  Progress to 45 minutes of aerobic exercise without signs/symptoms of physical distress      Intensity   THRR 40-80% of Max Heartrate  60-120    Ratings of Perceived Exertion  11-13    Perceived Dyspnea  0-4      Progression   Progression  Continue to progress workloads to maintain intensity without signs/symptoms of physical distress.      Resistance Training   Training Prescription  Yes    Weight  blue bands    Reps  10-15       Perform Capillary Blood Glucose checks as needed.  Exercise Prescription Changes:   Exercise Comments:   Exercise Goals and Review: Exercise Goals    Row Name 10/11/18 1404             Exercise Goals   Increase Physical Activity  Yes       Intervention  Provide advice, education, support and counseling about physical activity/exercise needs.;Develop an individualized exercise prescription for aerobic and resistive training based on initial evaluation findings, risk stratification, comorbidities and participant's personal goals.       Expected Outcomes  Short Term: Attend rehab on a regular basis to increase amount of physical activity.;Long Term: Add in home exercise to make exercise part of routine and to increase amount of physical activity.;Long Term: Exercising regularly at least 3-5 days a week.       Increase Strength and Stamina  Yes       Intervention  Provide advice, education, support and counseling about physical activity/exercise needs.;Develop an individualized exercise prescription for aerobic and resistive training based on initial evaluation findings, risk stratification, comorbidities and participant's personal  goals.       Expected Outcomes  Short Term: Increase workloads from initial exercise prescription for resistance, speed, and METs.;Short Term: Perform resistance training exercises routinely during rehab and add in resistance training at home;Long Term: Improve cardiorespiratory fitness, muscular endurance and strength as measured by increased METs and functional capacity (6MWT)       Able to understand and use rate of perceived exertion (RPE) scale  Yes       Intervention  Provide education and explanation on how to use RPE scale  Expected Outcomes  Short Term: Able to use RPE daily in rehab to express subjective intensity level;Long Term:  Able to use RPE to guide intensity level when exercising independently       Able to understand and use Dyspnea scale  Yes       Intervention  Provide education and explanation on how to use Dyspnea scale       Expected Outcomes  Short Term: Able to use Dyspnea scale daily in rehab to express subjective sense of shortness of breath during exertion;Long Term: Able to use Dyspnea scale to guide intensity level when exercising independently       Knowledge and understanding of Target Heart Rate Range (THRR)  Yes       Intervention  Provide education and explanation of THRR including how the numbers were predicted and where they are located for reference       Expected Outcomes  Short Term: Able to state/look up THRR       Understanding of Exercise Prescription  Yes       Intervention  Provide education, explanation, and written materials on patient's individual exercise prescription       Expected Outcomes  Short Term: Able to explain program exercise prescription;Long Term: Able to explain home exercise prescription to exercise independently          Exercise Goals Re-Evaluation :   Discharge Exercise Prescription (Final Exercise Prescription Changes):   Nutrition:  Target Goals: Understanding of nutrition guidelines, daily intake of sodium 1500mg ,  cholesterol 200mg , calories 30% from fat and 7% or less from saturated fats, daily to have 5 or more servings of fruits and vegetables.  Biometrics:    Nutrition Therapy Plan and Nutrition Goals: Nutrition Therapy & Goals - 10/14/18 0745      Nutrition Therapy   Diet  carb modified       Personal Nutrition Goals   Nutrition Goal  Pt to identify and limit food sources of sodium, and refined carbohydrates    Personal Goal #2  Identify food quantities necessary to achieve wt loss of  -2# per week to a goal wt loss of 2.7-10.9 kg (6-24 lb) at graduation from pulmonary rehab.    Personal Goal #3  Pt to build a healthy plate including fruits, vegetables, whole grains, and low-fat dairy products in a healthy meal plan    Personal Goal #4  Pt able to name foods that affect blood glucose       Intervention Plan   Intervention  Prescribe, educate and counsel regarding individualized specific dietary modifications aiming towards targeted core components such as weight, hypertension, lipid management, diabetes, heart failure and other comorbidities.    Expected Outcomes  Short Term Goal: Understand basic principles of dietary content, such as calories, fat, sodium, cholesterol and nutrients.;Long Term Goal: Adherence to prescribed nutrition plan.       Nutrition Assessments: Nutrition Assessments - 10/14/18 0748      Rate Your Plate Scores   Pre Score  63       Nutrition Goals Re-Evaluation: Nutrition Goals Re-Evaluation    Row Name 10/14/18 0745             Goals   Current Weight  190 lb 7.6 oz (86.4 kg)          Nutrition Goals Discharge (Final Nutrition Goals Re-Evaluation): Nutrition Goals Re-Evaluation - 10/14/18 0745      Goals   Current Weight  190 lb 7.6 oz (86.4 kg)  Psychosocial: Target Goals: Acknowledge presence or absence of significant depression and/or stress, maximize coping skills, provide positive support system. Participant is able to verbalize  types and ability to use techniques and skills needed for reducing stress and depression.  Initial Review & Psychosocial Screening: Initial Psych Review & Screening - 10/11/18 1417      Initial Review   Current issues with  None Identified      Family Dynamics   Good Support System?  Yes   pt has son and daughter wit grandchildren and great-grandchildren. Pt also has several groups of friends that she is involved with     Screening Interventions   Interventions  Encouraged to exercise    Expected Outcomes  Short Term goal: Identification and review with participant of any Quality of Life or Depression concerns found by scoring the questionnaire.;Long Term goal: The participant improves quality of Life and PHQ9 Scores as seen by post scores and/or verbalization of changes       Quality of Life Scores:  Scores of 19 and below usually indicate a poorer quality of life in these areas.  A difference of  2-3 points is a clinically meaningful difference.  A difference of 2-3 points in the total score of the Quality of Life Index has been associated with significant improvement in overall quality of life, self-image, physical symptoms, and general health in studies assessing change in quality of life.  PHQ-9: Recent Review Flowsheet Data    Depression screen [facility] 2/9 10/11/2018 09/20/2018 09/20/2018 09/01/2018 03/22/2018   Decreased Interest 0 0 1  0 0   Down, Depressed, Hopeless 0 0 0 0 0   PHQ - 2 Score 0 0 1 0 0   Altered sleeping 0 - - - -   Tired, decreased energy 2 - - - -   Change in appetite 0 - - - -   Feeling bad or failure about yourself  0 - - - -   Trouble concentrating 0 - - - -   Moving slowly or fidgety/restless 0 - - - -   PHQ-9 Score 2 - - - -   Difficult doing work/chores Not difficult at all - - - -     Interpretation of Total Score  Total Score Depression Severity:  1-4 = Minimal depression, 5-9 = Mild depression, 10-14 = Moderate depression, 15-19 = Moderately severe  depression, 20-27 = Severe depression   Psychosocial Evaluation and Intervention: Psychosocial Evaluation - 10/11/18 1419      Psychosocial Evaluation & Interventions   Interventions  Relaxation education;Encouraged to exercise with the program and follow exercise prescription    Comments  Pt states that she has very little stress in her life. She is on a fixed income, but knows her limits. She has a wonderful support system and has a positive and healthy outlook on life and her health    Expected Outcomes  pt will continue to maintain a positive and healthy outlook and remain free of psychosocial barriers    Continue Psychosocial Services   Follow up required by staff       Psychosocial Re-Evaluation:   Psychosocial Discharge (Final Psychosocial Re-Evaluation):   Education: Education Goals: Education classes will be provided on a weekly basis, covering required topics. Participant will state understanding/return demonstration of topics presented.  Learning Barriers/Preferences: Learning Barriers/Preferences - 10/11/18 1438      Learning Barriers/Preferences   Learning Barriers  Sight    Learning Preferences  Audio;Computer/Internet;Pictoral;Verbal Instruction;Video;Written Material  Education Topics: Risk Factor Reduction:  -Group instruction that is supported by a PowerPoint presentation. Instructor discusses the definition of a risk factor, different risk factors for pulmonary disease, and how the heart and lungs work together.     Nutrition for Pulmonary Patient:  -Group instruction provided by PowerPoint slides, verbal discussion, and written materials to support subject matter. The instructor gives an explanation and review of healthy diet recommendations, which includes a discussion on weight management, recommendations for fruit and vegetable consumption, as well as protein, fluid, caffeine, fiber, sodium, sugar, and alcohol. Tips for eating when patients are short  of breath are discussed.   Pursed Lip Breathing:  -Group instruction that is supported by demonstration and informational handouts. Instructor discusses the benefits of pursed lip and diaphragmatic breathing and detailed demonstration on how to preform both.     Oxygen Safety:  -Group instruction provided by PowerPoint, verbal discussion, and written material to support subject matter. There is an overview of "What is Oxygen" and "Why do we need it".  Instructor also reviews how to create a safe environment for oxygen use, the importance of using oxygen as prescribed, and the risks of noncompliance. There is a brief discussion on traveling with oxygen and resources the patient may utilize.   Oxygen Equipment:  -Group instruction provided by [facility] Staff utilizing handouts, written materials, and equipment demonstrations.   Signs and Symptoms:  -Group instruction provided by written material and verbal discussion to support subject matter. Warning signs and symptoms of infection, stroke, and heart attack are reviewed and when to call the physician/911 reinforced. Tips for preventing the spread of infection discussed.   Advanced Directives:  -Group instruction provided by verbal instruction and written material to support subject matter. Instructor reviews Advanced Directive laws and proper instruction for filling out document.   Pulmonary Video:  -Group video education that reviews the importance of medication and oxygen compliance, exercise, good nutrition, pulmonary hygiene, and pursed lip and diaphragmatic breathing for the pulmonary patient.   Exercise for the Pulmonary Patient:  -Group instruction that is supported by a PowerPoint presentation. Instructor discusses benefits of exercise, core components of exercise, frequency, duration, and intensity of an exercise routine, importance of utilizing pulse oximetry during exercise, safety while exercising, and options of places to  exercise outside of rehab.     Pulmonary Medications:  -Verbally interactive group education provided by instructor with focus on inhaled medications and proper administration.   Anatomy and Physiology of the Respiratory System and Intimacy:  -Group instruction provided by PowerPoint, verbal discussion, and written material to support subject matter. Instructor reviews respiratory cycle and anatomical components of the respiratory system and their functions. Instructor also reviews differences in obstructive and restrictive respiratory diseases with examples of each. Intimacy, Sex, and Sexuality differences are reviewed with a discussion on how relationships can change when diagnosed with pulmonary disease. Common sexual concerns are reviewed.   MD DAY -A group question and answer session with a medical doctor that allows participants to ask questions that relate to their pulmonary disease state.   OTHER EDUCATION -Group or individual verbal, written, or video instructions that support the educational goals of the pulmonary rehab program.   Holiday Eating Survival Tips:  -Group instruction provided by PowerPoint slides, verbal discussion, and written materials to support subject matter. The instructor gives patients tips, tricks, and techniques to help them not only survive but enjoy the holidays despite the onslaught of food that accompanies the holidays.   Knowledge  Questionnaire Score: Knowledge Questionnaire Score - 10/11/18 1439      Knowledge Questionnaire Score   Pre Score  16/18       Core Components/Risk Factors/Patient Goals at Admission: Personal Goals and Risk Factors at Admission - 10/11/18 1439      Core Components/Risk Factors/Patient Goals on Admission    Weight Management  Yes;Weight Loss    Intervention  Weight Management: Develop a combined nutrition and exercise program designed to reach desired caloric intake, while maintaining appropriate intake of nutrient  and fiber, sodium and fats, and appropriate energy expenditure required for the weight goal.;Weight Management: Provide education and appropriate resources to help participant work on and attain dietary goals.;Weight Management/Obesity: Establish reasonable short term and long term weight goals.;Obesity: Provide education and appropriate resources to help participant work on and attain dietary goals.    Admit Weight  190 lb 7.6 oz (86.4 kg)    Goal Weight: Short Term  180 lb (81.6 kg)    Goal Weight: Long Term  145 lb (65.8 kg)    Expected Outcomes  Short Term: Continue to assess and modify interventions until short term weight is achieved;Long Term: Adherence to nutrition and physical activity/exercise program aimed toward attainment of established weight goal;Weight Loss: Understanding of general recommendations for a balanced deficit meal plan, which promotes 1-2 lb weight loss per week and includes a negative energy balance of [phone] kcal/d;Understanding recommendations for meals to include 15-35% energy as protein, 25-35% energy from fat, 35-60% energy from carbohydrates, less than 200mg  of dietary cholesterol, 20-35 gm of total fiber daily;Understanding of distribution of calorie intake throughout the day with the consumption of 4-5 meals/snacks    Improve shortness of breath with ADL's  Yes    Intervention  Provide education, individualized exercise plan and daily activity instruction to help decrease symptoms of SOB with activities of daily living.    Expected Outcomes  Short Term: Improve cardiorespiratory fitness to achieve a reduction of symptoms when performing ADLs;Long Term: Be able to perform more ADLs without symptoms or delay the onset of symptoms    Hypertension  Yes    Intervention  Provide education on lifestyle modifcations including regular physical activity/exercise, weight management, moderate sodium restriction and increased consumption of fresh fruit, vegetables, and low fat  dairy, alcohol moderation, and smoking cessation.;Monitor prescription use compliance.    Expected Outcomes  Short Term: Continued assessment and intervention until BP is < 140/50mm HG in hypertensive participants. < 130/40mm HG in hypertensive participants with diabetes, heart failure or chronic kidney disease.;Long Term: Maintenance of blood pressure at goal levels.       Core Components/Risk Factors/Patient Goals Review:  Goals and Risk Factor Review    Row Name 10/11/18 1440             Core Components/Risk Factors/Patient Goals Review   Personal Goals Review  Weight Management/Obesity;Develop more efficient breathing techniques such as purse lipped breathing and diaphragmatic breathing and practicing self-pacing with activity.;Improve shortness of breath with ADL's;Increase knowledge of respiratory medications and ability to use respiratory devices properly.;Hypertension          Core Components/Risk Factors/Patient Goals at Discharge (Final Review):  Goals and Risk Factor Review - 10/11/18 1440      Core Components/Risk Factors/Patient Goals Review   Personal Goals Review  Weight Management/Obesity;Develop more efficient breathing techniques such as purse lipped breathing and diaphragmatic breathing and practicing self-pacing with activity.;Improve shortness of breath with ADL's;Increase knowledge of respiratory medications and ability to use  respiratory devices properly.;Hypertension       ITP Comments: ITP Comments    Row Name 10/11/18 1345           ITP Comments  Dr. Jennet Maduro, Medical Director, Pulmonary Rehab          Comments:

## 2018-10-14 NOTE — Progress Notes (Signed)
Marysol Conaway 73 y.o. female  DOB: 1945-08-29 MRN: [id number]           Nutrition Note 1. Dyspnea, unspecified type   2. Chronic respiratory failure with hypoxia [facility])    Past Medical History:  Diagnosis Date  . Cataract   . Colon polyps 02/09/2007   [facility]; colonoscopy.  Marland Kitchen COPD (chronic obstructive pulmonary disease) ([place])   . Diverticula of colon 02/09/2007  . Glucose intolerance (impaired glucose tolerance)   . Hypertension   . Internal hemorrhoids 02/09/2007.  Marland Kitchen Pneumonia 07/2018   Meds reviewed.    Current Outpatient Medications (Cardiovascular):  .  hydrochlorothiazide (HYDRODIURIL) 25 MG tablet, Take 1 tablet (25 mg total) by mouth daily.  Current Outpatient Medications (Respiratory):  .  albuterol (PROVENTIL HFA;VENTOLIN HFA) 108 (90 Base) MCG/ACT inhaler, Inhale 2 puffs into the lungs every 4 (four) hours as needed for wheezing or shortness of breath (cough, shortness of breath or wheezing.). Marland Kitchen  fluticasone (FLONASE) 50 MCG/ACT nasal spray, USE 2 SPRAYS IN EACH  NOSTRIL DAILY .  loratadine (CLARITIN) 10 MG tablet, Take 1 tablet (10 mg total) by mouth daily. .  Tiotropium Bromide-Olodaterol (STIOLTO RESPIMAT) 2.5-2.5 MCG/ACT AERS, Inhale 2 puffs into the lungs daily.    Current Outpatient Medications (Other):  .  famotidine (PEPCID) 20 MG tablet, One twice daily after bfast and after supper .  Multiple Vitamins-Minerals (WOMENS 50+ MULTI VITAMIN/MIN) TABS, Take by mouth daily. .  OXYGEN, 2lpm with rest and 3lpm with exertion AHC .  potassium chloride SA (K-DUR,KLOR-CON) 20 MEQ tablet, Take 1 tablet (20 mEq total) by mouth daily.   Ht: Ht Readings from Last 1 Encounters:  10/11/18 5' 5.5" (1.664 m)     Wt:  Wt Readings from Last 6 Encounters:  10/11/18 190 lb 7.6 oz (86.4 kg)  09/20/18 184 lb 6.4 oz (83.6 kg)  09/03/18 186 lb (84.4 kg)  09/01/18 186 lb (84.4 kg)  08/21/18 190 lb 12.8 oz (86.5 kg)  08/02/18 189 lb (85.7 kg)     BMI: Body mass index is 31.22  kg/m.    Current tobacco use? No    Labs:  Lipid Panel     Component Value Date/Time   CHOL 157 03/22/2018 0833   TRIG 104 03/22/2018 0833   HDL 52 03/22/2018 0833   CHOLHDL 3.0 03/22/2018 0833   CHOLHDL 3.1 01/25/2014 1341   VLDL 18 01/25/2014 1341   LDLCALC 84 03/22/2018 0833    Lab Results  Component Value Date   HGBA1C 6.6 (A) 09/20/2018    Nutrition Diagnosis ? Food-and nutrition-related knowledge deficit related to lack of exposure to information as related to diagnosis of pulmonary disease ? Overweight/obesity related to excessive energy intake as evidenced by a BMI of Body mass index is 31.22 kg/m.  Goal(s) 1. Pt to identify and limit food sources of sodium, and refined carbohydrates 2. Identify food quantities necessary to achieve wt loss of  -2# per week to a goal wt loss of 2.7-10.9 kg (6-24 lb) at graduation from pulmonary rehab. 3. Pt to build a healthy plate including fruits, vegetables, whole grains, and low-fat dairy products in a healthy meal plan. 4. Pt able to name foods that affect blood glucose   Plan:  Pt to attend Pulmonary Nutrition class Will provide client-centered nutrition education as part of interdisciplinary care.    Monitor and Evaluate progress toward nutrition goal with team.   Laurina Bustle, MS, RD, LDN 10/14/2018 7:45 AM

## 2018-10-15 ENCOUNTER — Ambulatory Visit: Payer: Medicare Other | Admitting: Internal Medicine

## 2018-10-20 ENCOUNTER — Telehealth: Payer: Self-pay | Admitting: Internal Medicine

## 2018-10-20 ENCOUNTER — Encounter: Payer: Self-pay | Admitting: Internal Medicine

## 2018-10-20 ENCOUNTER — Ambulatory Visit (INDEPENDENT_AMBULATORY_CARE_PROVIDER_SITE_OTHER): Payer: Medicare Other | Admitting: Internal Medicine

## 2018-10-20 ENCOUNTER — Ambulatory Visit (INDEPENDENT_AMBULATORY_CARE_PROVIDER_SITE_OTHER)
Admission: RE | Admit: 2018-10-20 | Discharge: 2018-10-20 | Disposition: A | Discharge status: Home / Self Care | Payer: Medicare Other | Source: Ambulatory Visit | Attending: Internal Medicine | Admitting: Internal Medicine

## 2018-10-20 DIAGNOSIS — J449 Chronic obstructive pulmonary disease, unspecified: Secondary | ICD-10-CM

## 2018-10-20 DIAGNOSIS — J9611 Chronic respiratory failure with hypoxia: Secondary | ICD-10-CM | POA: Diagnosis not present

## 2018-10-20 DIAGNOSIS — J181 Lobar pneumonia, unspecified organism: Secondary | ICD-10-CM

## 2018-10-20 DIAGNOSIS — J189 Pneumonia, unspecified organism: Secondary | ICD-10-CM

## 2018-10-20 NOTE — Telephone Encounter (Signed)
Called and spoke with Patient.  CXR results and recommendations from Dr Melvyn Novas given.  Understanding stated.  Nothing further at this time.  10/20/18- Notes recorded by Tanda Rockers, MD on 10/20/2018 at 3:44 PM EST Call pt: Reviewed cxr and no acute change so no change in recommendations made at [facility]

## 2018-10-20 NOTE — Patient Instructions (Signed)
Please remember to go to the  x-ray department  for your tests - we will call you with the results when they are available    Agree with pulmonary rehab starting tomorrow     Please schedule a follow up visit in 3 months but call sooner if needed  with all medications /inhalers/ solutions in hand so we can verify exactly what you are taking. This includes all medications from all doctors and over the counters

## 2018-10-20 NOTE — Progress Notes (Signed)
Subjective:    Patient ID: Nicole Spence, female   DOB: 1945-10-02,    MRN: [id number]    Brief patient profile:  25  yobf  Quit smoking 2015 noted onset of doe age [age] with sense of nasal drainage/ sore throat and eventually placed on spiriva then added symbicort but actually much better since started reflux meds early April 2019 and referred to pulmonary clinic 12/18/2017 by Dr   Nicole Spence p ent eval around 2017 pos gerd but had stopped PPI due to reported potential side effects in media .    History of Present Illness  12/18/2017 1st [place] [facility] office visit/ Nicole Spence   Chief Complaint  Patient presents with  . Pulmonary Consult    Referred by Dr. Windell Moment for eval of COPD. Pt states she was told years ago she had chronic bronchitis "but it never really bothered me". She has SOB with exertion such as cleaning her house or washing her car. She has an albuterol inhaler that she rarely uses.   doe indolent onset minimal progressive since age 83  Esp carrying groceries in from car has trouble with steps and walks slower than avg people never tries saba [place] = can't walk a nl pace on a flat grade s sob but does fine slow and flat  Takes  symbicort x 2 first thing am/ then spriva lunchtime and then symb 160   Cough better on otc gerd rx  Sleep ok  rec Stop spiriva and symbicort  Plan A = Automatic = stiolto 2 pffs each am  And continue the omeprazole Take 30-60 min before first meal of the day  Work on inhaler technique:   Plan B = Backup Only use your albuterol as a rescue medication   GERD diet      Admit date: 08/20/2018 Discharge date: 08/23/2018   Equipment/Devices: Oxygen via nasal cannula 4 L/min continuously.     Discharge Diagnoses:  Principal Problem:   CAP (community acquired pneumonia):   Essential hypertension   COPD GOLD I   Hypokalemia   Brief Summary: 73 year old female, lives alone, independent, PMH of COPD not on home oxygen, former  smoker, HTN, presented to ED on 08/20/2018 due to productive cough, dyspnea, subjective fevers for which she had gone to an urgent care where she was found to be hypoxic with oxygen saturation in the 70s and was referred to the ED for evaluation. She was admitted for community-acquired pneumonia, COPD exacerbation and acute hypoxic respiratory failure.   Assessment & Plan:  1. Lobar pneumonia (right upper lobe)/community-acquired pneumonia:Influenza panel PCR and urinary pneumococcal and Legionella antigen negative. Lactate and BNP normal. Empirically started on ceftriaxone and azithromycin and completed 3 days course. Recommend repeating chest x-ray in 4 weeks to ensure resolution of pneumonia findings. Blood cultures x2: Negative to date.  Clinically improved. Transitioned to Augmentin to complete total 7 days course.  Sputum culture shows normal respiratory flora 2. COPD exacerbation: She received a dose of IV Solu-Medrol in ED.  She did not have clinical bronchospasm after that and hence steroids were not continued.  However she remained persistently hypoxic yesterday and thereby suspecting some degree of COPD exacerbation without wheezing or rhonchi, initiated oral prednisone taper.  Complete course of antibiotics and steroid taper as outpatient.  Feels much better.  Former heavy smoker.    3. Acute respiratory failure with hypoxia: Not on home oxygen PTA. Now precipitated by pneumonia and COPD exacerbation.   Reassess today when patient  meets requirement for home oxygen and will be discharged on 4 L/min. Close outpatient follow-up with PCP at which time can determine if she can be weaned off of oxygen. 4. Hypokalemia:Replaced. Magnesium 2.5.  5. Essential hypertension:Controlled. HCTZ temporarily held while hospitalized will be resumed at discharge.      09/03/2018  f/u ov/Nicole Spence re: copd s/p pna/ off prednisone and abx x  5 days still using stiolto plus saba qid "they told me  to at discharge"  Chief Complaint  Patient presents with  . Hospitalization Follow-up    Pt was admitted to hospital for PNA and COPD flare 08/20/18-08/23/18.  She was sent home on o2 and is currently using 2-3lpm 24/7.  She states her breathing has improved since hospital d/c. She has occ cough with clear sputum but this is improving. She is having occ CP relieved by belching. She is using her albuterol inhaler 4 x daily.  Dyspnea:  MMRC2 = can't walk a nl pace on a flat grade s sob but does fine slow and flat eg [employer] s 02 but could feel it when got to car  Cough: minimal in am  Sleeping: bed blocks plus pillows  SABA use: 4 x daily  02: 2lpm 24/7  rec Plan A = Automatic =  Stiolto 2 puffs each am  Work on inhaler technique:   Plan B = Backup Only use your albuterol inhaler as a rescue medication  02 2lpm at bedtime and not needed at rest sitting  When you walk for exercise then goal is 90% or higher so either walk slower or wear 02 adjusted to keep sats over 90%  Please see patient coordinator before you leave today  to schedule BEST FIT evaluation for ambulatory 02      10/20/2018  f/u ov/Nicole Spence re: copd gold I / 02 at hs and ex  But not at rest @ 2lpm  Chief Complaint  Patient presents with  . Follow-up    Breathing has improved some since the last visit.   Dyspnea:  MMRC2 = can't walk a nl pace on a flat grade s sob but does fine slow and flat eg walking at [employer] pushing the cart s 02 not checking  Cough: no Sleeping: side on bed on 4 inches SABA use: not at all  02: 2lpm with house work / sleeping   No obvious day to day or daytime variability or assoc excess/ purulent sputum or mucus plugs or hemoptysis or cp or chest tightness, subjective wheeze or overt sinus or hb symptoms. .  Sleeping as above  without nocturnal  or early am exacerbation  of respiratory  c/o's or need for noct saba. Also denies any obvious fluctuation of symptoms with weather or  environmental changes or other aggravating or alleviating factors except as outlined above   No unusual exposure hx or h/o childhood pna/ asthma or knowledge of premature birth.  Current Allergies, Complete Past Medical History, Past Surgical History, Family History, and Social History were reviewed in [employer] record.  ROS  The following are not active complaints unless bolded Hoarseness, sore throat, dysphagia, dental problems, itching, sneezing,  nasal congestion or discharge of excess mucus or purulent secretions, ear ache,   fever, chills, sweats, unintended wt loss or wt gain, classically pleuritic or exertional cp,  orthopnea pnd or arm/hand swelling  or leg swelling, presyncope, palpitations, abdominal pain, anorexia, nausea, vomiting, diarrhea  or change in bowel habits or change in bladder habits,  change in stools or change in urine, dysuria, hematuria,  rash, arthralgias, visual complaints, headache, numbness, weakness or ataxia or problems with walking or coordination,  change in mood or  memory.        Current Meds  Medication Sig  . albuterol (PROVENTIL HFA;VENTOLIN HFA) 108 (90 Base) MCG/ACT inhaler Inhale 2 puffs into the lungs every 4 (four) hours as needed for wheezing or shortness of breath (cough, shortness of breath or wheezing.).  Marland Kitchen famotidine (PEPCID) 20 MG tablet One twice daily after bfast and after supper  . fluticasone (FLONASE) 50 MCG/ACT nasal spray USE 2 SPRAYS IN EACH  NOSTRIL DAILY  . hydrochlorothiazide (HYDRODIURIL) 25 MG tablet Take 1 tablet (25 mg total) by mouth daily.  Marland Kitchen loratadine (CLARITIN) 10 MG tablet Take 1 tablet (10 mg total) by mouth daily.  . Multiple Vitamins-Minerals (WOMENS 50+ MULTI VITAMIN/MIN) TABS Take by mouth daily.  . OXYGEN 2lpm with sleep and 3lpm with exertion AHC  . potassium chloride SA (K-DUR,KLOR-CON) 20 MEQ tablet Take 1 tablet (20 mEq total) by mouth daily.  . Tiotropium Bromide-Olodaterol (STIOLTO  RESPIMAT) 2.5-2.5 MCG/ACT AERS Inhale 2 puffs into the lungs daily.            Objective:   Physical Exam  amb bf nad   10/20/2018          189  09/03/2018          186  08/02/2018        189   01/29/2018         185   12/18/17 183 lb (83 kg)  11/18/17 183 lb (83 kg)  10/12/17 183 lb (83 kg)     Vital signs reviewed - Note on arrival 02 sats  95% on RA   HEENT: nl dentition / oropharynx. Nl external ear canals without cough reflex -  Mild bilateral non-specific turbinate edema     NECK :  without JVD/Nodes/TM/ nl carotid upstrokes bilaterally   LUNGS: no acc muscle use,  Mild barrel  contour chest wall with bilateral  Distant bs s audible wheeze and  without cough on insp or exp maneuver and mild  Hyperresonant  to  percussion bilaterally     CV:  RRR  no s3 or murmur or increase in P2, and no edema   ABD:  soft and nontender with pos late insp Hoover's  in the supine position. No bruits or organomegaly appreciated, bowel sounds nl  MS:   Nl gait/  ext warm without deformities, calf tenderness, cyanosis or clubbing No obvious joint restrictions   SKIN: warm and dry without lesions    NEURO:  alert, approp, nl sensorium with  no motor or cerebellar deficits apparent.         CXR PA and Lateral:   10/20/2018 :    I personally reviewed images and agree with radiology impression as follows:   1. No active cardiopulmonary disease. 2. Mild upper lobe predominant emphysema.     Assessment:

## 2018-10-21 ENCOUNTER — Encounter (HOSPITAL_COMMUNITY)
Admission: RE | Admit: 2018-10-21 | Discharge: 2018-10-21 | Disposition: A | Discharge status: Home / Self Care | Payer: Medicare Other | Source: Ambulatory Visit | Attending: Internal Medicine | Admitting: Internal Medicine

## 2018-10-21 DIAGNOSIS — J9611 Chronic respiratory failure with hypoxia: Secondary | ICD-10-CM | POA: Diagnosis not present

## 2018-10-21 DIAGNOSIS — R0600 Dyspnea, unspecified: Secondary | ICD-10-CM

## 2018-10-21 NOTE — Progress Notes (Signed)
Daily Session Note  Patient Details  Name: Nicole Spence MRN: [id number] Date of Birth: Jan 01, 1946 Referring Provider:     Pulmonary Rehab Walk Test from 10/14/2018 in [place]  Referring Provider  Dr. Melvyn Novas      Encounter Date: 10/21/2018  Check In: Session Check In - 10/21/18 1348      Check-In   Supervising physician immediately available to respond to emergencies  [facility] immediately available    Physician(s)  Dr. Verlon Au    Staff Present  Rosebud Poles, RN, BSN;Molly DiVincenzo, MS, ACSM RCEP, Exercise Physiologist;Dalton Kris Mouton, MS, Exercise Physiologist;Lisa Ysidro Evert, RN    Medication changes reported      No    Fall or balance concerns reported     No    Tobacco Cessation  No Change    Warm-up and Cool-down  Performed as group-led instruction    Resistance Training Performed  Yes    VAD Patient?  No    PAD/SET Patient?  No      Pain Assessment   Currently in Pain?  No/denies    Multiple Pain Sites  No       Capillary Blood Glucose: No results found for this or any previous visit (from the past 24 hour(s)).    Social History   Tobacco Use  Smoking Status Former Smoker  . Packs/day: 1.00  . Years: 30.00  . Pack years: 30.00  . Types: Cigarettes  . Last attempt to quit: 10/23/2013  . Years since quitting: 4.9  Smokeless Tobacco Never Used    Goals Met:  Proper associated with RPD/PD & O2 Sat Exercise tolerated well Strength training completed today  Goals Unmet:  Not Applicable  Comments: Service time is from 1330 to 1510   Dr. Rush Farmer is Medical Director for Pulmonary Rehab at [facility].

## 2018-10-24 ENCOUNTER — Encounter: Payer: Self-pay | Admitting: Internal Medicine

## 2018-10-24 NOTE — Assessment & Plan Note (Signed)
cxr now clear, no directed f/u needed    I had an extended discussion with the patient reviewing all relevant studies completed to date and  lasting 15 to 20 minutes of a 25 minute visit    See device teaching which extended face to face time for this visit.  Each maintenance medication was reviewed in detail including emphasizing most importantly the difference between maintenance and prns and under what circumstances the prns are to be triggered using an action plan format that is not reflected in the computer generated alphabetically organized AVS which I have not found useful in most complex patients, especially with respiratory illnesses  Please see AVS for specific instructions unique to this visit that I personally wrote and verbalized to the the pt in detail and then reviewed with pt  by my nurse highlighting any  changes in therapy recommended at today's visit to their plan of care.

## 2018-10-24 NOTE — Assessment & Plan Note (Addendum)
Quit smoking 2015 - 12/18/2017  Walked RA x 3 laps @ 185 ft each stopped due to  End of study, fast pace, sats 90% at end  -  Spirometry 12/18/2017  FEV1 1.63 (83%)  Ratio 65 - 12/18/2017  > changed from  symbicort /spiriva dpi to stiolto - PFT's  01/29/2018  FEV1 1.81  (87 % ) ratio 67  p 16 % improvement from saba p nothing prior to study with DLCO  32 % corrects to 46  % for alv volume   - 09/03/2018  After extensive coaching inhaler device,  effectiveness =    90% with smi  - 10/20/2018  After extensive coaching inhaler device,  effectiveness =    75% with hfa (short Ti)  - starting rehab 10/21/18    Improving s/p admit maint on stiolto as per guidelines for pts with  GROUP B symptoms> agree with pulmonary rehab to start 10/20/18

## 2018-10-24 NOTE — Assessment & Plan Note (Signed)
08/02/2018   Room Air at Rest = 93%  >> Room Air while Ambulating = 83% Patient Saturations on 2 Liters of oxygen while Ambulating = 94%>  rx 2lpm with activity  - placed on 24 h 02 at d/c from [facility] p dx pna 08/23/18 - refer for BEST fit for amb 02  09/03/2018  - 09/03/2018   Walked RA x  X 359ft - stopped due to  Sob and desat to 87% corrected on 2lpm at avg pace so rec 02 2lpm hs and with exertion and referred to dme for BEST fit for amb 02   Needs to remember to check sats wit ambulation to be sure she is not desaturating - starting rehab where she'll be monitored/ titrated appropriately which should help reinforce the concept of providing adequate sats with ex at whatever FIO2 needed to accomplish this proportional to workload.

## 2018-10-26 ENCOUNTER — Encounter (HOSPITAL_COMMUNITY)
Admission: RE | Admit: 2018-10-26 | Discharge: 2018-10-26 | Disposition: A | Discharge status: Home / Self Care | Payer: Medicare Other | Source: Ambulatory Visit | Attending: Internal Medicine | Admitting: Internal Medicine

## 2018-10-26 VITALS — Wt 191.1 lb

## 2018-10-26 DIAGNOSIS — R0600 Dyspnea, unspecified: Secondary | ICD-10-CM

## 2018-10-26 DIAGNOSIS — J9611 Chronic respiratory failure with hypoxia: Secondary | ICD-10-CM

## 2018-10-26 NOTE — Progress Notes (Signed)
Daily Session Note  Patient Details  Name: Nicole Spence MRN: [id number] Date of Birth: 1946-07-01 Referring Provider:     Pulmonary Rehab Walk Test from 10/14/2018 in [place]  Referring Provider  Dr. Melvyn Novas      Encounter Date: 10/26/2018  Check In: Session Check In - 10/26/18 1356      Check-In   Supervising physician immediately available to respond to emergencies  [facility] immediately available    Physician(s)  Dr. Maylene Roes    Location  [facility]    Staff Present  Joycelyn Man RN, BSN;Joan Leonia Reeves, RN, BSN;Lisa Ysidro Evert, RN;Olinty [place]stone, MS, ACSM CEP, Exercise Physiologist    Medication changes reported      No    Fall or balance concerns reported     No    Tobacco Cessation  No Change    Warm-up and Cool-down  Performed as group-led instruction    Resistance Training Performed  Yes    VAD Patient?  No    PAD/SET Patient?  No      Pain Assessment   Currently in Pain?  No/denies    Pain Score  0-No pain    Multiple Pain Sites  No       Capillary Blood Glucose: No results found for this or any previous visit (from the past 24 hour(s)).  Exercise Prescription Changes - 10/26/18 1500      Response to Exercise   Blood Pressure (Admit)  140/70    Blood Pressure (Exercise)  132/70    Blood Pressure (Exit)  124/60    Heart Rate (Admit)  111 bpm    Heart Rate (Exercise)  132 bpm    Heart Rate (Exit)  111 bpm    Oxygen Saturation (Admit)  93 %    Oxygen Saturation (Exercise)  88 %    Oxygen Saturation (Exit)  98 %    Duration  Progress to 45 minutes of aerobic exercise without signs/symptoms of physical distress    Intensity  Other (comment)   40-80% HRR     Resistance Training   Training Prescription  Yes    Weight  blue bands    Reps  10-15    Time  10 Minutes      Oxygen   Oxygen  Continuous    Liters  3      Bike   Level  0.4    Minutes  17      NuStep   Level  2    SPM  80    Minutes  17    METs  1.9      Track   Laps  14    Minutes  17       Social History   Tobacco Use  Smoking Status Former Smoker  . Packs/day: 1.00  . Years: 30.00  . Pack years: 30.00  . Types: Cigarettes  . Last attempt to quit: 10/23/2013  . Years since quitting: 5.0  Smokeless Tobacco Never Used    Goals Met:  Proper associated with RPD/PD & O2 Sat Exercise tolerated well Strength training completed today  Goals Unmet:  Not Applicable  Comments: Service time is from 1330 to 1510    Dr. Rush Farmer is Medical Director for Pulmonary Rehab at [facility].

## 2018-10-28 ENCOUNTER — Encounter (HOSPITAL_COMMUNITY)
Admission: RE | Admit: 2018-10-28 | Discharge: 2018-10-28 | Disposition: A | Discharge status: Home / Self Care | Payer: Medicare Other | Source: Ambulatory Visit | Attending: Internal Medicine | Admitting: Internal Medicine

## 2018-10-28 DIAGNOSIS — J9611 Chronic respiratory failure with hypoxia: Secondary | ICD-10-CM | POA: Diagnosis not present

## 2018-10-28 DIAGNOSIS — R0600 Dyspnea, unspecified: Secondary | ICD-10-CM | POA: Diagnosis not present

## 2018-10-28 NOTE — Progress Notes (Signed)
Daily Session Note  Patient Details  Name: Nicole Spence MRN: [id number] Date of Birth: August 01, 1946 Referring Provider:     Pulmonary Rehab Walk Test from 10/14/2018 in [place]  Referring Provider  Dr. Melvyn Novas      Encounter Date: 10/28/2018  Check In: Session Check In - 10/28/18 1330      Check-In   Supervising physician immediately available to respond to emergencies  [facility] immediately available    Physician(s)  Dr. Wynelle Cleveland    Location  [facility]    Staff Present  Joycelyn Man RN, BSN;Brilee Port, MS, ACSM RCEP, Exercise Physiologist;Dalton Kris Mouton, MS, Exercise Physiologist;Joan Leonia Reeves, RN, Roque Cash, RN    Medication changes reported      No    Fall or balance concerns reported     No    Tobacco Cessation  No Change    Warm-up and Cool-down  Performed as group-led instruction    Resistance Training Performed  Yes    VAD Patient?  No    PAD/SET Patient?  No      Pain Assessment   Currently in Pain?  No/denies    Multiple Pain Sites  No       Capillary Blood Glucose: No results found for this or any previous visit (from the past 24 hour(s)).    Social History   Tobacco Use  Smoking Status Former Smoker  . Packs/day: 1.00  . Years: 30.00  . Pack years: 30.00  . Types: Cigarettes  . Last attempt to quit: 10/23/2013  . Years since quitting: 5.0  Smokeless Tobacco Never Used    Goals Met:  Exercise tolerated well  Goals Unmet:  Not Applicable  Comments: Service time is from 1:30p to 3:00p    Dr. Rush Farmer is Medical Director for Pulmonary Rehab at [facility]

## 2018-10-28 NOTE — Progress Notes (Signed)
Pulmonary Individual Treatment Plan  Patient Details  Name: Alexyss Balzarini MRN: [id number] Date of Birth: 1946/03/20 Referring Provider:     Pulmonary Rehab Walk Test from 10/14/2018 in [place]  Referring Provider  Dr. Melvyn Novas      Initial Encounter Date:    Pulmonary Rehab Walk Test from 10/14/2018 in [place]  Date  10/14/18      Visit Diagnosis: Dyspnea, unspecified type  Patient's Home Medications on Admission:   Current Outpatient Medications:  .  albuterol (PROVENTIL HFA;VENTOLIN HFA) 108 (90 Base) MCG/ACT inhaler, Inhale 2 puffs into the lungs every 4 (four) hours as needed for wheezing or shortness of breath (cough, shortness of breath or wheezing.)., Disp: 1 Inhaler, Rfl: 1 .  famotidine (PEPCID) 20 MG tablet, One twice daily after bfast and after supper, Disp: 180 tablet, Rfl: 3 .  fluticasone (FLONASE) 50 MCG/ACT nasal spray, USE 2 SPRAYS IN EACH  NOSTRIL DAILY, Disp: 48 g, Rfl: 2 .  hydrochlorothiazide (HYDRODIURIL) 25 MG tablet, Take 1 tablet (25 mg total) by mouth daily., Disp: 90 tablet, Rfl: 3 .  loratadine (CLARITIN) 10 MG tablet, Take 1 tablet (10 mg total) by mouth daily., Disp: 30 tablet, Rfl: 11 .  Multiple Vitamins-Minerals (WOMENS 50+ MULTI VITAMIN/MIN) TABS, Take by mouth daily., Disp: , Rfl:  .  OXYGEN, 2lpm with sleep and 3lpm with exertion AHC, Disp: , Rfl:  .  potassium chloride SA (K-DUR,KLOR-CON) 20 MEQ tablet, Take 1 tablet (20 mEq total) by mouth daily., Disp: 90 tablet, Rfl: 3 .  Tiotropium Bromide-Olodaterol (STIOLTO RESPIMAT) 2.5-2.5 MCG/ACT AERS, Inhale 2 puffs into the lungs daily., Disp: 1 Inhaler, Rfl: 0  Past Medical History: Past Medical History:  Diagnosis Date  . Cataract   . Colon polyps 02/09/2007   [facility]; colonoscopy.  Marland Kitchen COPD (chronic obstructive pulmonary disease) ([place])   . Diverticula of colon 02/09/2007  . Glucose intolerance (impaired glucose tolerance)   .  Hypertension   . Internal hemorrhoids 02/09/2007.  Marland Kitchen Pneumonia 07/2018    Tobacco Use: Social History   Tobacco Use  Smoking Status Former Smoker  . Packs/day: 1.00  . Years: 30.00  . Pack years: 30.00  . Types: Cigarettes  . Last attempt to quit: 10/23/2013  . Years since quitting: 5.0  Smokeless Tobacco Never Used    Labs: Recent Review Flowsheet Data    Labs for ITP Cardiac and Pulmonary Rehab Latest Ref Rng & Units 03/17/2017 09/17/2017 03/22/2018 08/20/2018 09/20/2018   Cholestrol 100 - 199 mg/dL 163 175 157 - -   LDLCALC 0 - 99 mg/dL 85 94 84 - -   HDL >39 mg/dL 53 58 52 - -   Trlycerides 0 - 149 mg/dL 125 116 104 - -   Hemoglobin A1c 4.0 - 5.6 % 6.4(H) - 6.3(H) - 6.6(A)   HCO3 20.0 - 28.0 mmol/L - - - 29.7(H) -   O2SAT % - - - 68.6 -      Capillary Blood Glucose: No results found for: GLUCAP   Pulmonary Assessment Scores: Pulmonary Assessment Scores    Row Name 10/11/18 1411 10/14/18 1309       ADL UCSD   ADL Phase  Entry  Entry    SOB Score total  53  -      CAT Score   CAT Score  20  -      mMRC Score   mMRC Score  -  1  Pulmonary Function Assessment: Pulmonary Function Assessment - 10/11/18 1411      Breath   Bilateral Breath Sounds  Decreased;Clear    Shortness of Breath  Yes       Exercise Target Goals: Exercise Program Goal: Individual exercise prescription set using results from initial 6 min walk test and THRR while considering  patient's activity barriers and safety.   Exercise Prescription Goal: Initial exercise prescription builds to 30-45 minutes a day of aerobic activity, 2-3 days per week.  Home exercise guidelines will be given to patient during program as part of exercise prescription that the participant will acknowledge.  Activity Barriers & Risk Stratification: Activity Barriers & Cardiac Risk Stratification - 10/11/18 1403      Activity Barriers & Cardiac Risk Stratification   Activity Barriers  Arthritis;Back  Problems;Shortness of Breath;History of Falls       6 Minute Walk: 6 Minute Walk    Row Name 10/14/18 1302         6 Minute Walk   Phase  Initial     Distance  1250 feet     Walk Time  6 minutes     # of Rest Breaks  0     MPH  2.36     METS  2.84     RPE  12     Perceived Dyspnea   0     Symptoms  Yes (comment)     Comments  used wheelchair     Resting HR  97 bpm     Resting BP  124/68     Resting Oxygen Saturation   97 %     Exercise Oxygen Saturation  during 6 min walk  91 %     Max Ex. HR  129 bpm     Max Ex. BP  140/84       Interval HR   1 Minute HR  117     2 Minute HR  116     3 Minute HR  121     4 Minute HR  123     5 Minute HR  123     6 Minute HR  129     2 Minute Post HR  121     Interval Heart Rate?  Yes       Interval Oxygen   Interval Oxygen?  Yes     Baseline Oxygen Saturation %  97 %     1 Minute Oxygen Saturation %  94 %     1 Minute Liters of Oxygen  3 L     2 Minute Oxygen Saturation %  93 %     2 Minute Liters of Oxygen  3 L     3 Minute Oxygen Saturation %  92 %     3 Minute Liters of Oxygen  3 L     4 Minute Oxygen Saturation %  91 %     4 Minute Liters of Oxygen  3 L     5 Minute Oxygen Saturation %  92 %     5 Minute Liters of Oxygen  3 L     6 Minute Oxygen Saturation %  91 %     6 Minute Liters of Oxygen  3 L     2 Minute Post Oxygen Saturation %  94 %     2 Minute Post Liters of Oxygen  3 L        Oxygen Initial Assessment: Oxygen Initial  Assessment - 10/14/18 1309      Initial 6 min Walk   Oxygen Used  Continuous;E-Tanks    Liters per minute  3      Program Oxygen Prescription   Program Oxygen Prescription  Continuous;E-Tanks    Liters per minute  3       Oxygen Re-Evaluation: Oxygen Re-Evaluation    Row Name 10/26/18 0856             Program Oxygen Prescription   Program Oxygen Prescription  Continuous;E-Tanks       Liters per minute  3         Home Oxygen   Home Oxygen Device  Home Concentrator;E-Tanks        Sleep Oxygen Prescription  Continuous       Liters per minute  2       Home Exercise Oxygen Prescription  Continuous       Liters per minute  3       Home at Rest Exercise Oxygen Prescription  None       Compliance with Home Oxygen Use  No         Goals/Expected Outcomes   Short Term Goals  To learn and exhibit compliance with exercise, home and travel O2 prescription;To learn and understand importance of monitoring SPO2 with pulse oximeter and demonstrate accurate use of the pulse oximeter.;To learn and understand importance of maintaining oxygen saturations>88%;To learn and demonstrate proper use of respiratory medications;To learn and demonstrate proper pursed lip breathing techniques or other breathing techniques.       Long  Term Goals  Exhibits compliance with exercise, home and travel O2 prescription;Verbalizes importance of monitoring SPO2 with pulse oximeter and return demonstration;Maintenance of O2 saturations>88%;Exhibits proper breathing techniques, such as pursed lip breathing or other method taught during program session;Compliance with respiratory medication;Demonstrates proper use of MDI's       Goals/Expected Outcomes  understanding of oxygen needs and compliance          Oxygen Discharge (Final Oxygen Re-Evaluation): Oxygen Re-Evaluation - 10/26/18 0856      Program Oxygen Prescription   Program Oxygen Prescription  Continuous;E-Tanks    Liters per minute  3      Home Oxygen   Home Oxygen Device  Home Concentrator;E-Tanks    Sleep Oxygen Prescription  Continuous    Liters per minute  2    Home Exercise Oxygen Prescription  Continuous    Liters per minute  3    Home at Rest Exercise Oxygen Prescription  None    Compliance with Home Oxygen Use  No      Goals/Expected Outcomes   Short Term Goals  To learn and exhibit compliance with exercise, home and travel O2 prescription;To learn and understand importance of monitoring SPO2 with pulse oximeter and  demonstrate accurate use of the pulse oximeter.;To learn and understand importance of maintaining oxygen saturations>88%;To learn and demonstrate proper use of respiratory medications;To learn and demonstrate proper pursed lip breathing techniques or other breathing techniques.    Long  Term Goals  Exhibits compliance with exercise, home and travel O2 prescription;Verbalizes importance of monitoring SPO2 with pulse oximeter and return demonstration;Maintenance of O2 saturations>88%;Exhibits proper breathing techniques, such as pursed lip breathing or other method taught during program session;Compliance with respiratory medication;Demonstrates proper use of MDI's    Goals/Expected Outcomes  understanding of oxygen needs and compliance       Initial Exercise Prescription: Initial Exercise Prescription - 10/14/18 1300  Date of Initial Exercise RX and Referring Provider   Date  10/14/18    Referring Provider  Dr. Melvyn Novas      Oxygen   Oxygen  Continuous    Liters  3      Bike   Level  0.4    Minutes  17    METs  1.5      NuStep   Level  2    SPM  80    Minutes  17    METs  1.5      Track   Laps  10    Minutes  17      Prescription Details   Frequency (times per week)  2    Duration  Progress to 45 minutes of aerobic exercise without signs/symptoms of physical distress      Intensity   THRR 40-80% of Max Heartrate  60-120    Ratings of Perceived Exertion  11-13    Perceived Dyspnea  0-4      Progression   Progression  Continue to progress workloads to maintain intensity without signs/symptoms of physical distress.      Resistance Training   Training Prescription  Yes    Weight  blue bands    Reps  10-15       Perform Capillary Blood Glucose checks as needed.  Exercise Prescription Changes: Exercise Prescription Changes    Row Name 10/26/18 1500             Response to Exercise   Blood Pressure (Admit)  140/70       Blood Pressure (Exercise)  132/70        Blood Pressure (Exit)  124/60       Heart Rate (Admit)  111 bpm       Heart Rate (Exercise)  132 bpm       Heart Rate (Exit)  111 bpm       Oxygen Saturation (Admit)  93 %       Oxygen Saturation (Exercise)  88 %       Oxygen Saturation (Exit)  98 %       Duration  Progress to 45 minutes of aerobic exercise without signs/symptoms of physical distress       Intensity  Other (comment) 40-80% HRR         Resistance Training   Training Prescription  Yes       Weight  blue bands       Reps  10-15       Time  10 Minutes         Oxygen   Oxygen  Continuous       Liters  3         Bike   Level  0.4       Minutes  17         NuStep   Level  2       SPM  80       Minutes  17       METs  1.9         Track   Laps  14       Minutes  17          Exercise Comments:   Exercise Goals and Review: Exercise Goals    Row Name 10/11/18 1404             Exercise Goals   Increase Physical Activity  Yes  Intervention  Provide advice, education, support and counseling about physical activity/exercise needs.;Develop an individualized exercise prescription for aerobic and resistive training based on initial evaluation findings, risk stratification, comorbidities and participant's personal goals.       Expected Outcomes  Short Term: Attend rehab on a regular basis to increase amount of physical activity.;Long Term: Add in home exercise to make exercise part of routine and to increase amount of physical activity.;Long Term: Exercising regularly at least 3-5 days a week.       Increase Strength and Stamina  Yes       Intervention  Provide advice, education, support and counseling about physical activity/exercise needs.;Develop an individualized exercise prescription for aerobic and resistive training based on initial evaluation findings, risk stratification, comorbidities and participant's personal goals.       Expected Outcomes  Short Term: Increase workloads from initial exercise  prescription for resistance, speed, and METs.;Short Term: Perform resistance training exercises routinely during rehab and add in resistance training at home;Long Term: Improve cardiorespiratory fitness, muscular endurance and strength as measured by increased METs and functional capacity (6MWT)       Able to understand and use rate of perceived exertion (RPE) scale  Yes       Intervention  Provide education and explanation on how to use RPE scale       Expected Outcomes  Short Term: Able to use RPE daily in rehab to express subjective intensity level;Long Term:  Able to use RPE to guide intensity level when exercising independently       Able to understand and use Dyspnea scale  Yes       Intervention  Provide education and explanation on how to use Dyspnea scale       Expected Outcomes  Short Term: Able to use Dyspnea scale daily in rehab to express subjective sense of shortness of breath during exertion;Long Term: Able to use Dyspnea scale to guide intensity level when exercising independently       Knowledge and understanding of Target Heart Rate Range (THRR)  Yes       Intervention  Provide education and explanation of THRR including how the numbers were predicted and where they are located for reference       Expected Outcomes  Short Term: Able to state/look up THRR       Understanding of Exercise Prescription  Yes       Intervention  Provide education, explanation, and written materials on patient's individual exercise prescription       Expected Outcomes  Short Term: Able to explain program exercise prescription;Long Term: Able to explain home exercise prescription to exercise independently          Exercise Goals Re-Evaluation : Exercise Goals Re-Evaluation    Row Name 10/26/18 0855             Exercise Goal Re-Evaluation   Exercise Goals Review  Increase Physical Activity;Increase Strength and Stamina;Able to understand and use rate of perceived exertion (RPE) scale;Able to  understand and use Dyspnea scale;Knowledge and understanding of Target Heart Rate Range (THRR);Understanding of Exercise Prescription       Comments  Patient has only completed one rehab session. Will cont to monitor and progress as able.       Expected Outcomes  Through exercise at rehab and at home, the patient will decrease shortness of breath with daily activities and feel confident in carrying out an exercise regime at home.  Discharge Exercise Prescription (Final Exercise Prescription Changes): Exercise Prescription Changes - 10/26/18 1500      Response to Exercise   Blood Pressure (Admit)  140/70    Blood Pressure (Exercise)  132/70    Blood Pressure (Exit)  124/60    Heart Rate (Admit)  111 bpm    Heart Rate (Exercise)  132 bpm    Heart Rate (Exit)  111 bpm    Oxygen Saturation (Admit)  93 %    Oxygen Saturation (Exercise)  88 %    Oxygen Saturation (Exit)  98 %    Duration  Progress to 45 minutes of aerobic exercise without signs/symptoms of physical distress    Intensity  Other (comment)   40-80% HRR     Resistance Training   Training Prescription  Yes    Weight  blue bands    Reps  10-15    Time  10 Minutes      Oxygen   Oxygen  Continuous    Liters  3      Bike   Level  0.4    Minutes  17      NuStep   Level  2    SPM  80    Minutes  17    METs  1.9      Track   Laps  14    Minutes  17       Nutrition:  Target Goals: Understanding of nutrition guidelines, daily intake of sodium <1555m, cholesterol <2059m calories 30% from fat and 7% or less from saturated fats, daily to have 5 or more servings of fruits and vegetables.  Biometrics:    Nutrition Therapy Plan and Nutrition Goals: Nutrition Therapy & Goals - 10/14/18 0745      Nutrition Therapy   Diet  carb modified       Personal Nutrition Goals   Nutrition Goal  Pt to identify and limit food sources of sodium, and refined carbohydrates    Personal Goal #2  Identify food quantities  necessary to achieve wt loss of  -2# per week to a goal wt loss of 2.7-10.9 kg (6-24 lb) at graduation from pulmonary rehab.    Personal Goal #3  Pt to build a healthy plate including fruits, vegetables, whole grains, and low-fat dairy products in a healthy meal plan    Personal Goal #4  Pt able to name foods that affect blood glucose       Intervention Plan   Intervention  Prescribe, educate and counsel regarding individualized specific dietary modifications aiming towards targeted core components such as weight, hypertension, lipid management, diabetes, heart failure and other comorbidities.    Expected Outcomes  Short Term Goal: Understand basic principles of dietary content, such as calories, fat, sodium, cholesterol and nutrients.;Long Term Goal: Adherence to prescribed nutrition plan.       Nutrition Assessments: Nutrition Assessments - 10/14/18 0748      Rate Your Plate Scores   Pre Score  63       Nutrition Goals Re-Evaluation: Nutrition Goals Re-Evaluation    Row Name 10/14/18 0745             Goals   Current Weight  190 lb 7.6 oz (86.4 kg)          Nutrition Goals Discharge (Final Nutrition Goals Re-Evaluation): Nutrition Goals Re-Evaluation - 10/14/18 0745      Goals   Current Weight  190 lb 7.6 oz (86.4 kg)  Psychosocial: Target Goals: Acknowledge presence or absence of significant depression and/or stress, maximize coping skills, provide positive support system. Participant is able to verbalize types and ability to use techniques and skills needed for reducing stress and depression.  Initial Review & Psychosocial Screening: Initial Psych Review & Screening - 10/11/18 1417      Initial Review   Current issues with  None Identified      Family Dynamics   Good Support System?  Yes   pt has son and daughter wit grandchildren and great-grandchildren. Pt also has several groups of friends that she is involved with     Screening Interventions    Interventions  Encouraged to exercise    Expected Outcomes  Short Term goal: Identification and review with participant of any Quality of Life or Depression concerns found by scoring the questionnaire.;Long Term goal: The participant improves quality of Life and PHQ9 Scores as seen by post scores and/or verbalization of changes       Quality of Life Scores:  Scores of 19 and below usually indicate a poorer quality of life in these areas.  A difference of  2-3 points is a clinically meaningful difference.  A difference of 2-3 points in the total score of the Quality of Life Index has been associated with significant improvement in overall quality of life, self-image, physical symptoms, and general health in studies assessing change in quality of life.  PHQ-9: Recent Review Flowsheet Data    Depression screen [facility] 2/9 10/11/2018 09/20/2018 09/20/2018 09/01/2018 03/22/2018   Decreased Interest 0 0 1  0 0   Down, Depressed, Hopeless 0 0 0 0 0   PHQ - 2 Score 0 0 1 0 0   Altered sleeping 0 - - - -   Tired, decreased energy 2 - - - -   Change in appetite 0 - - - -   Feeling bad or failure about yourself  0 - - - -   Trouble concentrating 0 - - - -   Moving slowly or fidgety/restless 0 - - - -   PHQ-9 Score 2 - - - -   Difficult doing work/chores Not difficult at all - - - -     Interpretation of Total Score  Total Score Depression Severity:  1-4 = Minimal depression, 5-9 = Mild depression, 10-14 = Moderate depression, 15-19 = Moderately severe depression, 20-27 = Severe depression   Psychosocial Evaluation and Intervention: Psychosocial Evaluation - 10/11/18 1419      Psychosocial Evaluation & Interventions   Interventions  Relaxation education;Encouraged to exercise with the program and follow exercise prescription    Comments  Pt states that she has very little stress in her life. She is on a fixed income, but knows her limits. She has a wonderful support system and has a positive and healthy  outlook on life and her health    Expected Outcomes  pt will continue to maintain a positive and healthy outlook and remain free of psychosocial barriers    Continue Psychosocial Services   Follow up required by staff       Psychosocial Re-Evaluation: Psychosocial Re-Evaluation    [place] Name 10/25/18 1422             Psychosocial Re-Evaluation   Current issues with  None Identified       Comments  No barriers to participation in pulmonary rehab       Expected Outcomes  Continues to have no barriers to participation in program  Interventions  Encouraged to attend Pulmonary Rehabilitation for the exercise;Relaxation education;Stress management education       Continue Psychosocial Services   No Follow up required          Psychosocial Discharge (Final Psychosocial Re-Evaluation): Psychosocial Re-Evaluation - 10/25/18 1422      Psychosocial Re-Evaluation   Current issues with  None Identified    Comments  No barriers to participation in pulmonary rehab    Expected Outcomes  Continues to have no barriers to participation in program    Interventions  Encouraged to attend Pulmonary Rehabilitation for the exercise;Relaxation education;Stress management education    Continue Psychosocial Services   No Follow up required       Education: Education Goals: Education classes will be provided on a weekly basis, covering required topics. Participant will state understanding/return demonstration of topics presented.  Learning Barriers/Preferences: Learning Barriers/Preferences - 10/11/18 1438      Learning Barriers/Preferences   Learning Barriers  Sight    Learning Preferences  Audio;Computer/Internet;Pictoral;Verbal Instruction;Video;Written Material       Education Topics: Risk Factor Reduction:  -Group instruction that is supported by a PowerPoint presentation. Instructor discusses the definition of a risk factor, different risk factors for pulmonary disease, and how the heart  and lungs work together.     Nutrition for Pulmonary Patient:  -Group instruction provided by PowerPoint slides, verbal discussion, and written materials to support subject matter. The instructor gives an explanation and review of healthy diet recommendations, which includes a discussion on weight management, recommendations for fruit and vegetable consumption, as well as protein, fluid, caffeine, fiber, sodium, sugar, and alcohol. Tips for eating when patients are short of breath are discussed.   Pursed Lip Breathing:  -Group instruction that is supported by demonstration and informational handouts. Instructor discusses the benefits of pursed lip and diaphragmatic breathing and detailed demonstration on how to preform both.     Oxygen Safety:  -Group instruction provided by PowerPoint, verbal discussion, and written material to support subject matter. There is an overview of "What is Oxygen" and "Why do we need it".  Instructor also reviews how to create a safe environment for oxygen use, the importance of using oxygen as prescribed, and the risks of noncompliance. There is a brief discussion on traveling with oxygen and resources the patient may utilize.   Oxygen Equipment:  -Group instruction provided by [facility] Staff utilizing handouts, written materials, and equipment demonstrations.   Signs and Symptoms:  -Group instruction provided by written material and verbal discussion to support subject matter. Warning signs and symptoms of infection, stroke, and heart attack are reviewed and when to call the physician/911 reinforced. Tips for preventing the spread of infection discussed.   PULMONARY REHAB OTHER RESPIRATORY from 10/21/2018 in [place]  Date  10/21/18  Educator  Remo Lipps  Instruction Review Code  1- Verbalizes Understanding      Advanced Directives:  -Group instruction provided by verbal instruction and written material to support subject  matter. Instructor reviews Advanced Directive laws and proper instruction for filling out document.   Pulmonary Video:  -Group video education that reviews the importance of medication and oxygen compliance, exercise, good nutrition, pulmonary hygiene, and pursed lip and diaphragmatic breathing for the pulmonary patient.   Exercise for the Pulmonary Patient:  -Group instruction that is supported by a PowerPoint presentation. Instructor discusses benefits of exercise, core components of exercise, frequency, duration, and intensity of an exercise routine, importance of utilizing pulse oximetry  during exercise, safety while exercising, and options of places to exercise outside of rehab.     Pulmonary Medications:  -Verbally interactive group education provided by instructor with focus on inhaled medications and proper administration.   Anatomy and Physiology of the Respiratory System and Intimacy:  -Group instruction provided by PowerPoint, verbal discussion, and written material to support subject matter. Instructor reviews respiratory cycle and anatomical components of the respiratory system and their functions. Instructor also reviews differences in obstructive and restrictive respiratory diseases with examples of each. Intimacy, Sex, and Sexuality differences are reviewed with a discussion on how relationships can change when diagnosed with pulmonary disease. Common sexual concerns are reviewed.   MD DAY -A group question and answer session with a medical doctor that allows participants to ask questions that relate to their pulmonary disease state.   OTHER EDUCATION -Group or individual verbal, written, or video instructions that support the educational goals of the pulmonary rehab program.   Holiday Eating Survival Tips:  -Group instruction provided by PowerPoint slides, verbal discussion, and written materials to support subject matter. The instructor gives patients tips, tricks, and  techniques to help them not only survive but enjoy the holidays despite the onslaught of food that accompanies the holidays.   Knowledge Questionnaire Score: Knowledge Questionnaire Score - 10/11/18 1439      Knowledge Questionnaire Score   Pre Score  16/18       Core Components/Risk Factors/Patient Goals at Admission: Personal Goals and Risk Factors at Admission - 10/11/18 1439      Core Components/Risk Factors/Patient Goals on Admission    Weight Management  Yes;Weight Loss    Intervention  Weight Management: Develop a combined nutrition and exercise program designed to reach desired caloric intake, while maintaining appropriate intake of nutrient and fiber, sodium and fats, and appropriate energy expenditure required for the weight goal.;Weight Management: Provide education and appropriate resources to help participant work on and attain dietary goals.;Weight Management/Obesity: Establish reasonable short term and long term weight goals.;Obesity: Provide education and appropriate resources to help participant work on and attain dietary goals.    Admit Weight  190 lb 7.6 oz (86.4 kg)    Goal Weight: Short Term  180 lb (81.6 kg)    Goal Weight: Long Term  145 lb (65.8 kg)    Expected Outcomes  Short Term: Continue to assess and modify interventions until short term weight is achieved;Long Term: Adherence to nutrition and physical activity/exercise program aimed toward attainment of established weight goal;Weight Loss: Understanding of general recommendations for a balanced deficit meal plan, which promotes 1-2 lb weight loss per week and includes a negative energy balance of [phone] kcal/d;Understanding recommendations for meals to include 15-35% energy as protein, 25-35% energy from fat, 35-60% energy from carbohydrates, less than 243m of dietary cholesterol, 20-35 gm of total fiber daily;Understanding of distribution of calorie intake throughout the day with the consumption of 4-5  meals/snacks    Improve shortness of breath with ADL's  Yes    Intervention  Provide education, individualized exercise plan and daily activity instruction to help decrease symptoms of SOB with activities of daily living.    Expected Outcomes  Short Term: Improve cardiorespiratory fitness to achieve a reduction of symptoms when performing ADLs;Long Term: Be able to perform more ADLs without symptoms or delay the onset of symptoms    Hypertension  Yes    Intervention  Provide education on lifestyle modifcations including regular physical activity/exercise, weight management, moderate sodium restriction and increased  consumption of fresh fruit, vegetables, and low fat dairy, alcohol moderation, and smoking cessation.;Monitor prescription use compliance.    Expected Outcomes  Short Term: Continued assessment and intervention until BP is < 140/4m HG in hypertensive participants. < 130/863mHG in hypertensive participants with diabetes, heart failure or chronic kidney disease.;Long Term: Maintenance of blood pressure at goal levels.       Core Components/Risk Factors/Patient Goals Review:  Goals and Risk Factor Review    Row Name 10/11/18 1440 10/25/18 1424           Core Components/Risk Factors/Patient Goals Review   Personal Goals Review  Weight Management/Obesity;Develop more efficient breathing techniques such as purse lipped breathing and diaphragmatic breathing and practicing self-pacing with activity.;Improve shortness of breath with ADL's;Increase knowledge of respiratory medications and ability to use respiratory devices properly.;Hypertension  Weight Management/Obesity;Develop more efficient breathing techniques such as purse lipped breathing and diaphragmatic breathing and practicing self-pacing with activity.;Improve shortness of breath with ADL's;Increase knowledge of respiratory medications and ability to use respiratory devices properly.;Hypertension      Review  -  Just started  program, has attended 2 exercise sessions, too early to have met goals      Expected Outcomes  -  See admission goals         Core Components/Risk Factors/Patient Goals at Discharge (Final Review):  Goals and Risk Factor Review - 10/25/18 1424      Core Components/Risk Factors/Patient Goals Review   Personal Goals Review  Weight Management/Obesity;Develop more efficient breathing techniques such as purse lipped breathing and diaphragmatic breathing and practicing self-pacing with activity.;Improve shortness of breath with ADL's;Increase knowledge of respiratory medications and ability to use respiratory devices properly.;Hypertension    Review  Just started program, has attended 2 exercise sessions, too early to have met goals    Expected Outcomes  See admission goals       ITP Comments: ITP Comments    Ro[place] 10/11/18 1345           ITP Comments  Dr. WeJennet MaduroMedical Director, Pulmonary Rehab          Comments: ITP REVIEW Pt is making expected progress toward pulmonary rehab goals after completing 2 sessions. Recommend continued exercise, life style modification, education, and utilization of breathing techniques to increase stamina and strength and decrease shortness of breath with exertion.

## 2018-11-02 ENCOUNTER — Encounter (HOSPITAL_COMMUNITY)
Admission: RE | Admit: 2018-11-02 | Discharge: 2018-11-02 | Disposition: A | Discharge status: Home / Self Care | Payer: Medicare Other | Source: Ambulatory Visit | Attending: Internal Medicine | Admitting: Internal Medicine

## 2018-11-02 DIAGNOSIS — J9611 Chronic respiratory failure with hypoxia: Secondary | ICD-10-CM | POA: Diagnosis not present

## 2018-11-02 DIAGNOSIS — R0600 Dyspnea, unspecified: Secondary | ICD-10-CM | POA: Diagnosis not present

## 2018-11-02 NOTE — Progress Notes (Signed)
Daily Session Note  Patient Details  Name: Nicole Spence MRN: [id number] Date of Birth: 1946/03/11 Referring Provider:     Pulmonary Rehab Walk Test from 10/14/2018 in [place]  Referring Provider  Dr. Melvyn Novas      Encounter Date: 11/02/2018  Check In: Session Check In - 11/02/18 1353      Check-In   Supervising physician immediately available to respond to emergencies  [facility] immediately available    Physician(s)  Dr. Jonnie Finner    Location  [facility]    Staff Present  Hoy Register, MS, Exercise Physiologist;Lisa Ysidro Evert, RN;Joan Leonia Reeves, RN, BSN;Jarika Robben, MS, ACSM RCEP, Exercise Physiologist    Medication changes reported      No    Fall or balance concerns reported     No    Tobacco Cessation  No Change    Warm-up and Cool-down  Performed as group-led instruction    Resistance Training Performed  Yes    VAD Patient?  No    PAD/SET Patient?  No      Pain Assessment   Currently in Pain?  No/denies    Pain Score  0-No pain    Multiple Pain Sites  No       Capillary Blood Glucose: No results found for this or any previous visit (from the past 24 hour(s)).    Social History   Tobacco Use  Smoking Status Former Smoker  . Packs/day: 1.00  . Years: 30.00  . Pack years: 30.00  . Types: Cigarettes  . Last attempt to quit: 10/23/2013  . Years since quitting: 5.0  Smokeless Tobacco Never Used    Goals Met:  Exercise tolerated well  Goals Unmet:  Not Applicable  Comments: Service time is from 1:30p to 3:15p    Dr. Rush Farmer is Medical Director for Pulmonary Rehab at [facility].

## 2018-11-04 ENCOUNTER — Encounter (HOSPITAL_COMMUNITY)
Admission: RE | Admit: 2018-11-04 | Discharge: 2018-11-04 | Disposition: A | Discharge status: Home / Self Care | Payer: Medicare Other | Source: Ambulatory Visit | Attending: Internal Medicine | Admitting: Internal Medicine

## 2018-11-04 ENCOUNTER — Other Ambulatory Visit: Payer: Self-pay

## 2018-11-04 VITALS — Wt 193.3 lb

## 2018-11-04 DIAGNOSIS — R0600 Dyspnea, unspecified: Secondary | ICD-10-CM

## 2018-11-04 DIAGNOSIS — J9611 Chronic respiratory failure with hypoxia: Secondary | ICD-10-CM | POA: Diagnosis not present

## 2018-11-04 NOTE — Progress Notes (Signed)
Daily Session Note  Patient Details  Name: Nicole Spence MRN: [id number] Date of Birth: March 04, 1946 Referring Provider:     Pulmonary Rehab Walk Test from 10/14/2018 in [place]  Referring Provider  Dr. Melvyn Novas      Encounter Date: 11/04/2018  Check In: Session Check In - 11/04/18 1356      Check-In   Supervising physician immediately available to respond to emergencies  [facility] immediately available    Physician(s)  Dr. Cathlean Sauer    Location  [facility]    Staff Present  Joycelyn Man RN, BSN;Art Levan, MS, ACSM RCEP, Exercise Physiologist;Dalton Kris Mouton, MS, Exercise Physiologist;Lisa Ysidro Evert, RN    Medication changes reported      No    Fall or balance concerns reported     No    Tobacco Cessation  No Change    Warm-up and Cool-down  Performed as group-led instruction    Resistance Training Performed  Yes    VAD Patient?  No    PAD/SET Patient?  No      Pain Assessment   Currently in Pain?  No/denies    Pain Score  0-No pain    Multiple Pain Sites  No       Capillary Blood Glucose: No results found for this or any previous visit (from the past 24 hour(s)).    Social History   Tobacco Use  Smoking Status Former Smoker  . Packs/day: 1.00  . Years: 30.00  . Pack years: 30.00  . Types: Cigarettes  . Last attempt to quit: 10/23/2013  . Years since quitting: 5.0  Smokeless Tobacco Never Used    Goals Met:  Exercise tolerated well  Goals Unmet:  Not Applicable  Comments: Service time is from 1:30p to 3:15p    Dr. Rush Farmer is Medical Director for Pulmonary Rehab at [facility].

## 2018-11-09 ENCOUNTER — Telehealth (HOSPITAL_COMMUNITY): Payer: Self-pay

## 2018-11-09 ENCOUNTER — Encounter (HOSPITAL_COMMUNITY): Payer: Medicare Other

## 2018-11-11 ENCOUNTER — Encounter (HOSPITAL_COMMUNITY): Payer: Medicare Other

## 2018-11-15 ENCOUNTER — Telehealth (HOSPITAL_COMMUNITY): Payer: Self-pay

## 2018-11-16 ENCOUNTER — Encounter (HOSPITAL_COMMUNITY): Payer: Medicare Other

## 2018-11-16 NOTE — Progress Notes (Signed)
Pulmonary Individual Treatment Plan  Patient Details  Name: Nicole Spence MRN: [id number] Date of Birth: July 25, 1946 Referring Provider:     Pulmonary Rehab Walk Test from 10/14/2018 in [place]  Referring Provider  Dr. Melvyn Novas      Initial Encounter Date:    Pulmonary Rehab Walk Test from 10/14/2018 in [place]  Date  10/14/18      Visit Diagnosis: Dyspnea, unspecified type  Chronic respiratory failure with hypoxia ([place])  Patient's Home Medications on Admission:   Current Outpatient Medications:    albuterol (PROVENTIL HFA;VENTOLIN HFA) 108 (90 Base) MCG/ACT inhaler, Inhale 2 puffs into the lungs every 4 (four) hours as needed for wheezing or shortness of breath (cough, shortness of breath or wheezing.)., Disp: 1 Inhaler, Rfl: 1   famotidine (PEPCID) 20 MG tablet, One twice daily after bfast and after supper, Disp: 180 tablet, Rfl: 3   fluticasone (FLONASE) 50 MCG/ACT nasal spray, USE 2 SPRAYS IN EACH  NOSTRIL DAILY, Disp: 48 g, Rfl: 2   hydrochlorothiazide (HYDRODIURIL) 25 MG tablet, Take 1 tablet (25 mg total) by mouth daily., Disp: 90 tablet, Rfl: 3   loratadine (CLARITIN) 10 MG tablet, Take 1 tablet (10 mg total) by mouth daily., Disp: 30 tablet, Rfl: 11   Multiple Vitamins-Minerals (WOMENS 50+ MULTI VITAMIN/MIN) TABS, Take by mouth daily., Disp: , Rfl:    OXYGEN, 2lpm with sleep and 3lpm with exertion AHC, Disp: , Rfl:    potassium chloride SA (K-DUR,KLOR-CON) 20 MEQ tablet, Take 1 tablet (20 mEq total) by mouth daily., Disp: 90 tablet, Rfl: 3   Tiotropium Bromide-Olodaterol (STIOLTO RESPIMAT) 2.5-2.5 MCG/ACT AERS, Inhale 2 puffs into the lungs daily., Disp: 1 Inhaler, Rfl: 0  Past Medical History: Past Medical History:  Diagnosis Date   Cataract    Colon polyps 02/09/2007   [facility]; colonoscopy.   COPD (chronic obstructive pulmonary disease) ([place])    Diverticula of colon 02/09/2007   Glucose  intolerance (impaired glucose tolerance)    Hypertension    Internal hemorrhoids 02/09/2007.   Pneumonia 07/2018    Tobacco Use: Social History   Tobacco Use  Smoking Status Former Smoker   Packs/day: 1.00   Years: 30.00   Pack years: 30.00   Types: Cigarettes   Last attempt to quit: 10/23/2013   Years since quitting: 5.0  Smokeless Tobacco Never Used    Labs: Recent Review Scientist, physiological    Labs for ITP Cardiac and Pulmonary Rehab Latest Ref Rng & Units 03/17/2017 09/17/2017 03/22/2018 08/20/2018 09/20/2018   Cholestrol 100 - 199 mg/dL 163 175 157 - -   LDLCALC 0 - 99 mg/dL 85 94 84 - -   HDL >39 mg/dL 53 58 52 - -   Trlycerides 0 - 149 mg/dL 125 116 104 - -   Hemoglobin A1c 4.0 - 5.6 % 6.4(H) - 6.3(H) - 6.6(A)   HCO3 20.0 - 28.0 mmol/L - - - 29.7(H) -   O2SAT % - - - 68.6 -      Capillary Blood Glucose: No results found for: GLUCAP   Pulmonary Assessment Scores: Pulmonary Assessment Scores    Row Name 10/11/18 1411 10/14/18 1309       ADL UCSD   ADL Phase  Entry  Entry    SOB Score total  53  --      CAT Score   CAT Score  20  --      mMRC Score   mMRC Score  --  1       Pulmonary Function Assessment: Pulmonary Function Assessment - 10/11/18 1411      Breath   Bilateral Breath Sounds  Decreased;Clear    Shortness of Breath  Yes       Exercise Target Goals: Exercise Program Goal: Individual exercise prescription set using results from initial 6 min walk test and THRR while considering  patients activity barriers and safety.   Exercise Prescription Goal: Initial exercise prescription builds to 30-45 minutes a day of aerobic activity, 2-3 days per week.  Home exercise guidelines will be given to patient during program as part of exercise prescription that the participant will acknowledge.  Activity Barriers & Risk Stratification: Activity Barriers & Cardiac Risk Stratification - 10/11/18 1403      Activity Barriers & Cardiac Risk  Stratification   Activity Barriers  Arthritis;Back Problems;Shortness of Breath;History of Falls       6 Minute Walk: 6 Minute Walk    Row Name 10/14/18 1302         6 Minute Walk   Phase  Initial     Distance  1250 feet     Walk Time  6 minutes     # of Rest Breaks  0     MPH  2.36     METS  2.84     RPE  12     Perceived Dyspnea   0     Symptoms  Yes (comment)     Comments  used wheelchair     Resting HR  97 bpm     Resting BP  124/68     Resting Oxygen Saturation   97 %     Exercise Oxygen Saturation  during 6 min walk  91 %     Max Ex. HR  129 bpm     Max Ex. BP  140/84       Interval HR   1 Minute HR  117     2 Minute HR  116     3 Minute HR  121     4 Minute HR  123     5 Minute HR  123     6 Minute HR  129     2 Minute Post HR  121     Interval Heart Rate?  Yes       Interval Oxygen   Interval Oxygen?  Yes     Baseline Oxygen Saturation %  97 %     1 Minute Oxygen Saturation %  94 %     1 Minute Liters of Oxygen  3 L     2 Minute Oxygen Saturation %  93 %     2 Minute Liters of Oxygen  3 L     3 Minute Oxygen Saturation %  92 %     3 Minute Liters of Oxygen  3 L     4 Minute Oxygen Saturation %  91 %     4 Minute Liters of Oxygen  3 L     5 Minute Oxygen Saturation %  92 %     5 Minute Liters of Oxygen  3 L     6 Minute Oxygen Saturation %  91 %     6 Minute Liters of Oxygen  3 L     2 Minute Post Oxygen Saturation %  94 %     2 Minute Post Liters of Oxygen  3 L  Oxygen Initial Assessment: Oxygen Initial Assessment - 10/14/18 1309      Initial 6 min Walk   Oxygen Used  Continuous;E-Tanks    Liters per minute  3      Program Oxygen Prescription   Program Oxygen Prescription  Continuous;E-Tanks    Liters per minute  3       Oxygen Re-Evaluation: Oxygen Re-Evaluation    Row Name 10/26/18 0856 11/15/18 0749           Program Oxygen Prescription   Program Oxygen Prescription  Continuous;E-Tanks  Continuous;E-Tanks      Liters  per minute  3  4      Comments  --  increased from 3 liters due to desats and high HR.        Home Oxygen   Home Oxygen Device  Home Concentrator;E-Tanks  Home Concentrator;E-Tanks      Sleep Oxygen Prescription  Continuous  Continuous      Liters per minute  2  2      Home Exercise Oxygen Prescription  Continuous  Continuous      Liters per minute  3  4      Home at Rest Exercise Oxygen Prescription  None  None      Compliance with Home Oxygen Use  No  No        Goals/Expected Outcomes   Short Term Goals  To learn and exhibit compliance with exercise, home and travel O2 prescription;To learn and understand importance of monitoring SPO2 with pulse oximeter and demonstrate accurate use of the pulse oximeter.;To learn and understand importance of maintaining oxygen saturations>88%;To learn and demonstrate proper use of respiratory medications;To learn and demonstrate proper pursed lip breathing techniques or other breathing techniques.  To learn and exhibit compliance with exercise, home and travel O2 prescription;To learn and understand importance of monitoring SPO2 with pulse oximeter and demonstrate accurate use of the pulse oximeter.;To learn and understand importance of maintaining oxygen saturations>88%;To learn and demonstrate proper use of respiratory medications;To learn and demonstrate proper pursed lip breathing techniques or other breathing techniques.      Long  Term Goals  Exhibits compliance with exercise, home and travel O2 prescription;Verbalizes importance of monitoring SPO2 with pulse oximeter and return demonstration;Maintenance of O2 saturations>88%;Exhibits proper breathing techniques, such as pursed lip breathing or other method taught during program session;Compliance with respiratory medication;Demonstrates proper use of MDIs  Exhibits compliance with exercise, home and travel O2 prescription;Verbalizes importance of monitoring SPO2 with pulse oximeter and return  demonstration;Maintenance of O2 saturations>88%;Exhibits proper breathing techniques, such as pursed lip breathing or other method taught during program session;Compliance with respiratory medication;Demonstrates proper use of MDIs      Goals/Expected Outcomes  understanding of oxygen needs and compliance  understanding of oxygen needs and compliance         Oxygen Discharge (Final Oxygen Re-Evaluation): Oxygen Re-Evaluation - 11/15/18 0749      Program Oxygen Prescription   Program Oxygen Prescription  Continuous;E-Tanks    Liters per minute  4    Comments  increased from 3 liters due to desats and high HR.      Home Oxygen   Home Oxygen Device  Home Concentrator;E-Tanks    Sleep Oxygen Prescription  Continuous    Liters per minute  2    Home Exercise Oxygen Prescription  Continuous    Liters per minute  4    Home at Rest Exercise Oxygen Prescription  None    Compliance with Home  Oxygen Use  No      Goals/Expected Outcomes   Short Term Goals  To learn and exhibit compliance with exercise, home and travel O2 prescription;To learn and understand importance of monitoring SPO2 with pulse oximeter and demonstrate accurate use of the pulse oximeter.;To learn and understand importance of maintaining oxygen saturations>88%;To learn and demonstrate proper use of respiratory medications;To learn and demonstrate proper pursed lip breathing techniques or other breathing techniques.    Long  Term Goals  Exhibits compliance with exercise, home and travel O2 prescription;Verbalizes importance of monitoring SPO2 with pulse oximeter and return demonstration;Maintenance of O2 saturations>88%;Exhibits proper breathing techniques, such as pursed lip breathing or other method taught during program session;Compliance with respiratory medication;Demonstrates proper use of MDIs    Goals/Expected Outcomes  understanding of oxygen needs and compliance       Initial Exercise Prescription: Initial Exercise  Prescription - 10/14/18 1300      Date of Initial Exercise RX and Referring Provider   Date  10/14/18    Referring Provider  Dr. Melvyn Novas      Oxygen   Oxygen  Continuous    Liters  3      Bike   Level  0.4    Minutes  17    METs  1.5      NuStep   Level  2    SPM  80    Minutes  17    METs  1.5      Track   Laps  10    Minutes  17      Prescription Details   Frequency (times per week)  2    Duration  Progress to 45 minutes of aerobic exercise without signs/symptoms of physical distress      Intensity   THRR 40-80% of Max Heartrate  60-120    Ratings of Perceived Exertion  11-13    Perceived Dyspnea  0-4      Progression   Progression  Continue to progress workloads to maintain intensity without signs/symptoms of physical distress.      Resistance Training   Training Prescription  Yes    Weight  blue bands    Reps  10-15       Perform Capillary Blood Glucose checks as needed.  Exercise Prescription Changes: Exercise Prescription Changes    Row Name 10/26/18 1500 11/04/18 0705           Response to Exercise   Blood Pressure (Admit)  140/70  118/60      Blood Pressure (Exercise)  132/70  130/62      Blood Pressure (Exit)  124/60  104/60      Heart Rate (Admit)  111 bpm  100 bpm      Heart Rate (Exercise)  132 bpm  117 bpm      Heart Rate (Exit)  111 bpm  98 bpm      Oxygen Saturation (Admit)  93 %  97 %      Oxygen Saturation (Exercise)  88 %  93 %      Oxygen Saturation (Exit)  98 %  98 %      Duration  Progress to 45 minutes of aerobic exercise without signs/symptoms of physical distress  Progress to 45 minutes of aerobic exercise without signs/symptoms of physical distress      Intensity  Other (comment) 40-80% HRR  Other (comment) 40-80% HRR        Resistance Training   Training Prescription  Yes  Yes      Weight  blue bands  blue bands      Reps  10-15  10-15      Time  10 Minutes  10 Minutes        Oxygen   Oxygen  Continuous  Continuous       Liters  3  3        Bike   Level  0.4  0.7      Minutes  17  17        NuStep   Level  2  3      SPM  80  80      Minutes  17  17      METs  1.9  2.2        Track   Laps  14  16      Minutes  17  17         Exercise Comments: Exercise Comments    Row Name 11/12/18 1043           Exercise Comments  Home exercise was completed over the phone  (Pended)           Exercise Goals and Review: Exercise Goals    [place] Name 10/11/18 1404             Exercise Goals   Increase Physical Activity  Yes       Intervention  Provide advice, education, support and counseling about physical activity/exercise needs.;Develop an individualized exercise prescription for aerobic and resistive training based on initial evaluation findings, risk stratification, comorbidities and participant's personal goals.       Expected Outcomes  Short Term: Attend rehab on a regular basis to increase amount of physical activity.;Long Term: Add in home exercise to make exercise part of routine and to increase amount of physical activity.;Long Term: Exercising regularly at least 3-5 days a week.       Increase Strength and Stamina  Yes       Intervention  Provide advice, education, support and counseling about physical activity/exercise needs.;Develop an individualized exercise prescription for aerobic and resistive training based on initial evaluation findings, risk stratification, comorbidities and participant's personal goals.       Expected Outcomes  Short Term: Increase workloads from initial exercise prescription for resistance, speed, and METs.;Short Term: Perform resistance training exercises routinely during rehab and add in resistance training at home;Long Term: Improve cardiorespiratory fitness, muscular endurance and strength as measured by increased METs and functional capacity (6MWT)       Able to understand and use rate of perceived exertion (RPE) scale  Yes       Intervention  Provide education and  explanation on how to use RPE scale       Expected Outcomes  Short Term: Able to use RPE daily in rehab to express subjective intensity level;Long Term:  Able to use RPE to guide intensity level when exercising independently       Able to understand and use Dyspnea scale  Yes       Intervention  Provide education and explanation on how to use Dyspnea scale       Expected Outcomes  Short Term: Able to use Dyspnea scale daily in rehab to express subjective sense of shortness of breath during exertion;Long Term: Able to use Dyspnea scale to guide intensity level when exercising independently       Knowledge and understanding of Target Heart Rate Range (THRR)  Yes       Intervention  Provide education and explanation of THRR including how the numbers were predicted and where they are located for reference       Expected Outcomes  Short Term: Able to state/look up THRR       Understanding of Exercise Prescription  Yes       Intervention  Provide education, explanation, and written materials on patient's individual exercise prescription       Expected Outcomes  Short Term: Able to explain program exercise prescription;Long Term: Able to explain home exercise prescription to exercise independently          Exercise Goals Re-Evaluation : Exercise Goals Re-Evaluation    Row Name 10/26/18 0855 11/15/18 0748           Exercise Goal Re-Evaluation   Exercise Goals Review  Increase Physical Activity;Increase Strength and Stamina;Able to understand and use rate of perceived exertion (RPE) scale;Able to understand and use Dyspnea scale;Knowledge and understanding of Target Heart Rate Range (THRR);Understanding of Exercise Prescription  --      Comments  Patient has only completed one rehab session. Will cont to monitor and progress as able.  Pulmonary Rehab is closed (starting 11/08/18) until further notice due to the [facility] Virus.       Expected Outcomes  Through exercise at rehab and at home, the patient  will decrease shortness of breath with daily activities and feel confident in carrying out an exercise regime at home.   --         Discharge Exercise Prescription (Final Exercise Prescription Changes): Exercise Prescription Changes - 11/04/18 0705      Response to Exercise   Blood Pressure (Admit)  118/60    Blood Pressure (Exercise)  130/62    Blood Pressure (Exit)  104/60    Heart Rate (Admit)  100 bpm    Heart Rate (Exercise)  117 bpm    Heart Rate (Exit)  98 bpm    Oxygen Saturation (Admit)  97 %    Oxygen Saturation (Exercise)  93 %    Oxygen Saturation (Exit)  98 %    Duration  Progress to 45 minutes of aerobic exercise without signs/symptoms of physical distress    Intensity  Other (comment)   40-80% HRR     Resistance Training   Training Prescription  Yes    Weight  blue bands    Reps  10-15    Time  10 Minutes      Oxygen   Oxygen  Continuous    Liters  3      Bike   Level  0.7    Minutes  17      NuStep   Level  3    SPM  80    Minutes  17    METs  2.2      Track   Laps  16    Minutes  17       Nutrition:  Target Goals: Understanding of nutrition guidelines, daily intake of sodium <152m, cholesterol <20110m calories 30% from fat and 7% or less from saturated fats, daily to have 5 or more servings of fruits and vegetables.  Biometrics:    Nutrition Therapy Plan and Nutrition Goals: Nutrition Therapy & Goals - 10/14/18 0745      Nutrition Therapy   Diet  carb modified       Personal Nutrition Goals   Nutrition Goal  Pt to identify and limit food sources  of sodium, and refined carbohydrates    Personal Goal #2  Identify food quantities necessary to achieve wt loss of  -2# per week to a goal wt loss of 2.7-10.9 kg (6-24 lb) at graduation from pulmonary rehab.    Personal Goal #3  Pt to build a healthy plate including fruits, vegetables, whole grains, and low-fat dairy products in a healthy meal plan    Personal Goal #4  Pt able to name foods  that affect blood glucose       Intervention Plan   Intervention  Prescribe, educate and counsel regarding individualized specific dietary modifications aiming towards targeted core components such as weight, hypertension, lipid management, diabetes, heart failure and other comorbidities.    Expected Outcomes  Short Term Goal: Understand basic principles of dietary content, such as calories, fat, sodium, cholesterol and nutrients.;Long Term Goal: Adherence to prescribed nutrition plan.       Nutrition Assessments: Nutrition Assessments - 10/14/18 0748      Rate Your Plate Scores   Pre Score  63       Nutrition Goals Re-Evaluation: Nutrition Goals Re-Evaluation    Row Name 10/14/18 0745             Goals   Current Weight  190 lb 7.6 oz (86.4 kg)          Nutrition Goals Discharge (Final Nutrition Goals Re-Evaluation): Nutrition Goals Re-Evaluation - 10/14/18 0745      Goals   Current Weight  190 lb 7.6 oz (86.4 kg)       Psychosocial: Target Goals: Acknowledge presence or absence of significant depression and/or stress, maximize coping skills, provide positive support system. Participant is able to verbalize types and ability to use techniques and skills needed for reducing stress and depression.  Initial Review & Psychosocial Screening: Initial Psych Review & Screening - 10/11/18 1417      Initial Review   Current issues with  None Identified      Family Dynamics   Good Support System?  Yes   pt has son and daughter wit grandchildren and great-grandchildren. Pt also has several groups of friends that she is involved with     Screening Interventions   Interventions  Encouraged to exercise    Expected Outcomes  Short Term goal: Identification and review with participant of any Quality of Life or Depression concerns found by scoring the questionnaire.;Long Term goal: The participant improves quality of Life and PHQ9 Scores as seen by post scores and/or verbalization  of changes       Quality of Life Scores:  Scores of 19 and below usually indicate a poorer quality of life in these areas.  A difference of  2-3 points is a clinically meaningful difference.  A difference of 2-3 points in the total score of the Quality of Life Index has been associated with significant improvement in overall quality of life, self-image, physical symptoms, and general health in studies assessing change in quality of life.  PHQ-9: Recent Review Flowsheet Data    Depression screen [facility] 2/9 10/11/2018 09/20/2018 09/20/2018 09/01/2018 03/22/2018   Decreased Interest 0 0 1  0 0   Down, Depressed, Hopeless 0 0 0 0 0   PHQ - 2 Score 0 0 1 0 0   Altered sleeping 0 - - - -   Tired, decreased energy 2 - - - -   Change in appetite 0 - - - -   Feeling bad or failure about yourself  0 - - - -  Trouble concentrating 0 - - - -   Moving slowly or fidgety/restless 0 - - - -   PHQ-9 Score 2 - - - -   Difficult doing work/chores Not difficult at all - - - -     Interpretation of Total Score  Total Score Depression Severity:  1-4 = Minimal depression, 5-9 = Mild depression, 10-14 = Moderate depression, 15-19 = Moderately severe depression, 20-27 = Severe depression   Psychosocial Evaluation and Intervention: Psychosocial Evaluation - 10/11/18 1419      Psychosocial Evaluation & Interventions   Interventions  Relaxation education;Encouraged to exercise with the program and follow exercise prescription    Comments  Pt states that she has very little stress in her life. She is on a fixed income, but knows her limits. She has a wonderful support system and has a positive and healthy outlook on life and her health    Expected Outcomes  pt will continue to maintain a positive and healthy outlook and remain free of psychosocial barriers    Continue Psychosocial Services   Follow up required by staff       Psychosocial Re-Evaluation: Psychosocial Re-Evaluation    [place] Name 10/25/18 1422  11/16/18 1449           Psychosocial Re-Evaluation   Current issues with  None Identified  None Identified      Comments  No barriers to participation in pulmonary rehab  No barriers to participation in pulmonary rehab      Expected Outcomes  Continues to have no barriers to participation in program  Continues to have no barriers to participation in program      Interventions  Encouraged to attend Pulmonary Rehabilitation for the exercise;Relaxation education;Stress management education  --      Continue Psychosocial Services   No Follow up required  No Follow up required         Psychosocial Discharge (Final Psychosocial Re-Evaluation): Psychosocial Re-Evaluation - 11/16/18 1449      Psychosocial Re-Evaluation   Current issues with  None Identified    Comments  No barriers to participation in pulmonary rehab    Expected Outcomes  Continues to have no barriers to participation in program    Continue Psychosocial Services   No Follow up required       Education: Education Goals: Education classes will be provided on a weekly basis, covering required topics. Participant will state understanding/return demonstration of topics presented.  Learning Barriers/Preferences: Learning Barriers/Preferences - 10/11/18 1438      Learning Barriers/Preferences   Learning Barriers  Sight    Learning Preferences  Audio;Computer/Internet;Pictoral;Verbal Instruction;Video;Written Material       Education Topics: Risk Factor Reduction:  -Group instruction that is supported by a PowerPoint presentation. Instructor discusses the definition of a risk factor, different risk factors for pulmonary disease, and how the heart and lungs work together.     Nutrition for Pulmonary Patient:  -Group instruction provided by PowerPoint slides, verbal discussion, and written materials to support subject matter. The instructor gives an explanation and review of healthy diet recommendations, which includes a  discussion on weight management, recommendations for fruit and vegetable consumption, as well as protein, fluid, caffeine, fiber, sodium, sugar, and alcohol. Tips for eating when patients are short of breath are discussed.   Pursed Lip Breathing:  -Group instruction that is supported by demonstration and informational handouts. Instructor discusses the benefits of pursed lip and diaphragmatic breathing and detailed demonstration on how to preform both.  Oxygen Safety:  -Group instruction provided by PowerPoint, verbal discussion, and written material to support subject matter. There is an overview of What is Oxygen and Why do we need it.  Instructor also reviews how to create a safe environment for oxygen use, the importance of using oxygen as prescribed, and the risks of noncompliance. There is a brief discussion on traveling with oxygen and resources the patient may utilize.   Oxygen Equipment:  -Group instruction provided by [facility] Staff utilizing handouts, written materials, and equipment demonstrations.   Signs and Symptoms:  -Group instruction provided by written material and verbal discussion to support subject matter. Warning signs and symptoms of infection, stroke, and heart attack are reviewed and when to call the physician/911 reinforced. Tips for preventing the spread of infection discussed.   PULMONARY REHAB OTHER RESPIRATORY from 10/28/2018 in [place]  Date  10/21/18  Educator  Remo Lipps  Instruction Review Code  1- Verbalizes Understanding      Advanced Directives:  -Group instruction provided by verbal instruction and written material to support subject matter. Instructor reviews Advanced Directive laws and proper instruction for filling out document.   Pulmonary Video:  -Group video education that reviews the importance of medication and oxygen compliance, exercise, good nutrition, pulmonary hygiene, and pursed lip and  diaphragmatic breathing for the pulmonary patient.   Exercise for the Pulmonary Patient:  -Group instruction that is supported by a PowerPoint presentation. Instructor discusses benefits of exercise, core components of exercise, frequency, duration, and intensity of an exercise routine, importance of utilizing pulse oximetry during exercise, safety while exercising, and options of places to exercise outside of rehab.     Pulmonary Medications:  -Verbally interactive group education provided by instructor with focus on inhaled medications and proper administration.   Anatomy and Physiology of the Respiratory System and Intimacy:  -Group instruction provided by PowerPoint, verbal discussion, and written material to support subject matter. Instructor reviews respiratory cycle and anatomical components of the respiratory system and their functions. Instructor also reviews differences in obstructive and restrictive respiratory diseases with examples of each. Intimacy, Sex, and Sexuality differences are reviewed with a discussion on how relationships can change when diagnosed with pulmonary disease. Common sexual concerns are reviewed.   MD DAY -A group question and answer session with a medical doctor that allows participants to ask questions that relate to their pulmonary disease state.   OTHER EDUCATION -Group or individual verbal, written, or video instructions that support the educational goals of the pulmonary rehab program.   Holiday Eating Survival Tips:  -Group instruction provided by PowerPoint slides, verbal discussion, and written materials to support subject matter. The instructor gives patients tips, tricks, and techniques to help them not only survive but enjoy the holidays despite the onslaught of food that accompanies the holidays.   Knowledge Questionnaire Score: Knowledge Questionnaire Score - 10/11/18 1439      Knowledge Questionnaire Score   Pre Score  16/18        Core Components/Risk Factors/Patient Goals at Admission: Personal Goals and Risk Factors at Admission - 10/11/18 1439      Core Components/Risk Factors/Patient Goals on Admission    Weight Management  Yes;Weight Loss    Intervention  Weight Management: Develop a combined nutrition and exercise program designed to reach desired caloric intake, while maintaining appropriate intake of nutrient and fiber, sodium and fats, and appropriate energy expenditure required for the weight goal.;Weight Management: Provide education and appropriate resources to help  participant work on and attain dietary goals.;Weight Management/Obesity: Establish reasonable short term and long term weight goals.;Obesity: Provide education and appropriate resources to help participant work on and attain dietary goals.    Admit Weight  190 lb 7.6 oz (86.4 kg)    Goal Weight: Short Term  180 lb (81.6 kg)    Goal Weight: Long Term  145 lb (65.8 kg)    Expected Outcomes  Short Term: Continue to assess and modify interventions until short term weight is achieved;Long Term: Adherence to nutrition and physical activity/exercise program aimed toward attainment of established weight goal;Weight Loss: Understanding of general recommendations for a balanced deficit meal plan, which promotes 1-2 lb weight loss per week and includes a negative energy balance of [phone] kcal/d;Understanding recommendations for meals to include 15-35% energy as protein, 25-35% energy from fat, 35-60% energy from carbohydrates, less than 236m of dietary cholesterol, 20-35 gm of total fiber daily;Understanding of distribution of calorie intake throughout the day with the consumption of 4-5 meals/snacks    Improve shortness of breath with ADL's  Yes    Intervention  Provide education, individualized exercise plan and daily activity instruction to help decrease symptoms of SOB with activities of daily living.    Expected Outcomes  Short Term: Improve  cardiorespiratory fitness to achieve a reduction of symptoms when performing ADLs;Long Term: Be able to perform more ADLs without symptoms or delay the onset of symptoms    Hypertension  Yes    Intervention  Provide education on lifestyle modifcations including regular physical activity/exercise, weight management, moderate sodium restriction and increased consumption of fresh fruit, vegetables, and low fat dairy, alcohol moderation, and smoking cessation.;Monitor prescription use compliance.    Expected Outcomes  Short Term: Continued assessment and intervention until BP is < 140/933mHG in hypertensive participants. < 130/8045mG in hypertensive participants with diabetes, heart failure or chronic kidney disease.;Long Term: Maintenance of blood pressure at goal levels.       Core Components/Risk Factors/Patient Goals Review:  Goals and Risk Factor Review    Row Name 10/11/18 1440 10/25/18 1424 11/16/18 1449         Core Components/Risk Factors/Patient Goals Review   Personal Goals Review  Weight Management/Obesity;Develop more efficient breathing techniques such as purse lipped breathing and diaphragmatic breathing and practicing self-pacing with activity.;Improve shortness of breath with ADL's;Increase knowledge of respiratory medications and ability to use respiratory devices properly.;Hypertension  Weight Management/Obesity;Develop more efficient breathing techniques such as purse lipped breathing and diaphragmatic breathing and practicing self-pacing with activity.;Improve shortness of breath with ADL's;Increase knowledge of respiratory medications and ability to use respiratory devices properly.;Hypertension  Weight Management/Obesity;Develop more efficient breathing techniques such as purse lipped breathing and diaphragmatic breathing and practicing self-pacing with activity.;Improve shortness of breath with ADL's;Increase knowledge of respiratory medications and ability to use respiratory  devices properly.;Hypertension     Review  --  Just started program, has attended 2 exercise sessions, too early to have met goals  Has attended 5 exercise sessions, program goals are on hold d/t program closing d/t COVID-19 precautions. Will hopefully reopen 12/14/2018.     Expected Outcomes  --  See admission goals  See admission goals        Core Components/Risk Factors/Patient Goals at Discharge (Final Review):  Goals and Risk Factor Review - 11/16/18 1449      Core Components/Risk Factors/Patient Goals Review   Personal Goals Review  Weight Management/Obesity;Develop more efficient breathing techniques such as purse lipped breathing and diaphragmatic breathing  and practicing self-pacing with activity.;Improve shortness of breath with ADL's;Increase knowledge of respiratory medications and ability to use respiratory devices properly.;Hypertension    Review  Has attended 5 exercise sessions, program goals are on hold d/t program closing d/t COVID-19 precautions. Will hopefully reopen 12/14/2018.    Expected Outcomes  See admission goals       ITP Comments: ITP Comments    Row Name 10/11/18 1345           ITP Comments  Dr. Jennet Maduro, Medical Director, Pulmonary Rehab          Comments: ITP REVIEW Pt is making expected progress toward pulmonary rehab goals after completing 5 sessions. Recommend continued exercise, life style modification, education, and utilization of breathing techniques to increase stamina and strength and decrease shortness of breath with exertion.

## 2018-11-18 ENCOUNTER — Encounter (HOSPITAL_COMMUNITY): Payer: Medicare Other

## 2018-11-23 ENCOUNTER — Encounter (HOSPITAL_COMMUNITY): Payer: Medicare Other

## 2018-11-25 ENCOUNTER — Encounter (HOSPITAL_COMMUNITY): Payer: Medicare Other

## 2018-11-30 ENCOUNTER — Encounter (HOSPITAL_COMMUNITY): Payer: Medicare Other

## 2018-12-02 ENCOUNTER — Encounter (HOSPITAL_COMMUNITY): Payer: Medicare Other

## 2018-12-07 ENCOUNTER — Encounter (HOSPITAL_COMMUNITY): Payer: Medicare Other

## 2018-12-07 ENCOUNTER — Telehealth (HOSPITAL_COMMUNITY): Payer: Self-pay

## 2018-12-09 ENCOUNTER — Encounter (HOSPITAL_COMMUNITY): Payer: Medicare Other

## 2018-12-14 ENCOUNTER — Encounter (HOSPITAL_COMMUNITY): Payer: Medicare Other

## 2018-12-16 ENCOUNTER — Encounter (HOSPITAL_COMMUNITY): Payer: Medicare Other

## 2018-12-21 ENCOUNTER — Encounter (HOSPITAL_COMMUNITY): Payer: Medicare Other

## 2018-12-23 ENCOUNTER — Encounter (HOSPITAL_COMMUNITY): Payer: Medicare Other

## 2018-12-28 ENCOUNTER — Encounter (HOSPITAL_COMMUNITY): Payer: Medicare Other

## 2018-12-28 ENCOUNTER — Other Ambulatory Visit: Payer: Self-pay | Admitting: Internal Medicine

## 2018-12-30 ENCOUNTER — Encounter (HOSPITAL_COMMUNITY): Payer: Medicare Other

## 2019-01-04 ENCOUNTER — Encounter (HOSPITAL_COMMUNITY): Payer: Medicare Other

## 2019-01-04 ENCOUNTER — Telehealth (HOSPITAL_COMMUNITY): Payer: Self-pay | Admitting: *Deleted

## 2019-01-06 ENCOUNTER — Encounter (HOSPITAL_COMMUNITY): Payer: Medicare Other

## 2019-01-11 ENCOUNTER — Encounter (HOSPITAL_COMMUNITY): Payer: Medicare Other

## 2019-01-13 ENCOUNTER — Encounter (HOSPITAL_COMMUNITY): Payer: Medicare Other

## 2019-01-18 ENCOUNTER — Other Ambulatory Visit: Payer: Self-pay

## 2019-01-18 ENCOUNTER — Encounter (HOSPITAL_COMMUNITY): Payer: Medicare Other

## 2019-01-18 ENCOUNTER — Ambulatory Visit (INDEPENDENT_AMBULATORY_CARE_PROVIDER_SITE_OTHER): Payer: Medicare Other | Admitting: Internal Medicine

## 2019-01-18 ENCOUNTER — Encounter: Payer: Self-pay | Admitting: Internal Medicine

## 2019-01-18 DIAGNOSIS — J449 Chronic obstructive pulmonary disease, unspecified: Secondary | ICD-10-CM

## 2019-01-18 DIAGNOSIS — J9611 Chronic respiratory failure with hypoxia: Secondary | ICD-10-CM | POA: Diagnosis not present

## 2019-01-18 NOTE — Assessment & Plan Note (Signed)
08/02/2018   Room Air at Rest = 93%  >> Room Air while Ambulating = 83% Patient Saturations on 2 Liters of oxygen while Ambulating = 94%>  rx 2lpm with activity  - placed on 24 h 02 at d/c from [facility] p dx pna 08/23/18 - refer for BEST fit for amb 02  09/03/2018  - 09/03/2018   Walked RA x  X 359ft - stopped due to  Sob and desat to 87% corrected on 2lpm at avg pace so rec 02 2lpm hs and with exertion and referred to dme for BEST fit for amb 02 > not done - referred again for best fit 01/18/2019   Reviewed goals for 02 sats / ok to leave off if > 88% with adls but always be sure stays over 90% with and leave on 2lpm hs

## 2019-01-18 NOTE — Assessment & Plan Note (Signed)
Quit smoking 2015 - 12/18/2017  Walked RA x 3 laps @ 185 ft each stopped due to  End of study, fast pace, sats 90% at end  -  Spirometry 12/18/2017  FEV1 1.63 (83%)  Ratio 65 - 12/18/2017  > changed from  symbicort /spiriva dpi to stiolto - PFT's  01/29/2018  FEV1 1.81  (87 % ) ratio 67  p 16 % improvement from saba p nothing prior to study with DLCO  32 % corrects to 46  % for alv volume   - 09/03/2018  After extensive coaching inhaler device,  effectiveness =    90% with smi  - starting rehab 10/21/18 > did x 3 weeks prior to covid  Pt is Group B in terms of symptom/risk and laba/lama therefore appropriate rx at this point >>>  Continue stiolto   F/u q 6 m / call sooner prn

## 2019-01-18 NOTE — Patient Instructions (Addendum)
We will check to see if you can get a more portable system so you can walk outside   No change in meds:  Continue stiolto as you are   Only use your albuterol (ventolin) as a rescue medication to be used if you can't catch your breath by resting or doing a relaxed purse lip breathing pattern.  - The less you use it, the better it will work when you need it. - Ok to use up to 2 puffs  every 4 hours if you must but call for immediate appointment if use goes up over your usual need - Don't leave home without it !!  (think of it like the spare tire for your car)   Work on pushing the aerobic part of your workout harder, for example the target is 30 minutes straight daily at a point where you are short of breath but never out of breath on enough oxygen to drive your sats above 90% the whole time    Please schedule a follow up visit in 6 months but call sooner if needed   Patient has Mychart and does not need paper copy

## 2019-01-18 NOTE — Progress Notes (Signed)
Subjective:    Patient ID: Nicole Spence, female   DOB: 1945-10-02,    MRN: [id number]    Brief patient profile:  25  yobf  Quit smoking 2015 noted onset of doe age [age] with sense of nasal drainage/ sore throat and eventually placed on spiriva then added symbicort but actually much better since started reflux meds early April 2019 and referred to pulmonary clinic 12/18/2017 by Nicole   Reginia Spence p ent eval around 2017 pos gerd but had stopped PPI due to reported potential side effects in media .    History of Present Illness  12/18/2017 1st [place] office visit/ Nicole Spence   Chief Complaint  Patient presents with  . Pulmonary Consult    Referred by Nicole. Windell Spence for eval of COPD. Pt states she was told years ago she had chronic bronchitis "but it never really bothered me". She has SOB with exertion such as cleaning her house or washing her car. She has an albuterol inhaler that she rarely uses.   doe indolent onset minimal progressive since age 83  Esp carrying groceries in from car has trouble with steps and walks slower than avg people never tries saba [place] = can't walk a nl pace on a flat grade s sob but does fine slow and flat  Takes  symbicort x 2 first thing am/ then spriva lunchtime and then symb 160   Cough better on otc gerd rx  Sleep ok  rec Stop spiriva and symbicort  Plan A = Automatic = stiolto 2 pffs each am  And continue the omeprazole Take 30-60 min before first meal of the day  Work on inhaler technique:   Plan B = Backup Only use your albuterol as a rescue medication   GERD diet      Admit date: 08/20/2018 Discharge date: 08/23/2018   Equipment/Devices: Oxygen via nasal cannula 4 L/min continuously.     Discharge Diagnoses:  Principal Problem:   CAP (community acquired pneumonia):   Essential hypertension   COPD GOLD I   Hypokalemia   Brief Summary: 73 year old female, lives alone, independent, PMH of COPD not on home oxygen, former  smoker, HTN, presented to ED on 08/20/2018 due to productive cough, dyspnea, subjective fevers for which she had gone to an urgent care where she was found to be hypoxic with oxygen saturation in the 70s and was referred to the ED for evaluation. She was admitted for community-acquired pneumonia, COPD exacerbation and acute hypoxic respiratory failure.   Assessment & Plan:  1. Lobar pneumonia (right upper lobe)/community-acquired pneumonia:Influenza panel PCR and urinary pneumococcal and Legionella antigen negative. Lactate and BNP normal. Empirically started on ceftriaxone and azithromycin and completed 3 days course. Recommend repeating chest x-ray in 4 weeks to ensure resolution of pneumonia findings. Blood cultures x2: Negative to date.  Clinically improved. Transitioned to Augmentin to complete total 7 days course.  Sputum culture shows normal respiratory flora 2. COPD exacerbation: She received a dose of IV Solu-Medrol in ED.  She did not have clinical bronchospasm after that and hence steroids were not continued.  However she remained persistently hypoxic yesterday and thereby suspecting some degree of COPD exacerbation without wheezing or rhonchi, initiated oral prednisone taper.  Complete course of antibiotics and steroid taper as outpatient.  Feels much better.  Former heavy smoker.    3. Acute respiratory failure with hypoxia: Not on home oxygen PTA. Now precipitated by pneumonia and COPD exacerbation.   Reassess today when patient  meets requirement for home oxygen and will be discharged on 4 L/min. Close outpatient follow-up with PCP at which time can determine if she can be weaned off of oxygen. 4. Hypokalemia:Replaced. Magnesium 2.5.  5. Essential hypertension:Controlled. HCTZ temporarily held while hospitalized will be resumed at discharge.      09/03/2018  f/u ov/Nicole Spence re: copd s/p pna/ off prednisone and abx x  5 days still using stiolto plus saba qid "they told me  to at discharge"  Chief Complaint  Patient presents with  . Hospitalization Follow-up    Pt was admitted to hospital for PNA and COPD flare 08/20/18-08/23/18.  She was sent home on o2 and is currently using 2-3lpm 24/7.  She states her breathing has improved since hospital d/c. She has occ cough with clear sputum but this is improving. She is having occ CP relieved by belching. She is using her albuterol inhaler 4 x daily.  Dyspnea:  MMRC2 = can't walk a nl pace on a flat grade s sob but does fine slow and flat eg [employer] s 02 but could feel it when got to car  Cough: minimal in am  Sleeping: bed blocks plus pillows  SABA use: 4 x daily  02: 2lpm 24/7  rec Plan A = Automatic =  Stiolto 2 puffs each am  Work on inhaler technique:   Plan B = Backup Only use your albuterol inhaler as a rescue medication  02 2lpm at bedtime and not needed at rest sitting  When you walk for exercise then goal is 90% or higher so either walk slower or wear 02 adjusted to keep sats over 90%  Please see patient coordinator before you leave today  to schedule BEST FIT evaluation for ambulatory 02      10/20/2018  f/u ov/Nicole Spence re: copd gold I / 02 at hs and ex  But not at rest @ 2lpm  Chief Complaint  Patient presents with  . Follow-up    Breathing has improved some since the last visit.   Dyspnea:  MMRC2 = can't walk a nl pace on a flat grade s sob but does fine slow and flat eg walking at [employer] pushing the cart s 02 not checking  Cough: no Sleeping: side on bed on 4 inches SABA use: not at all  02: 2lpm with house work / sleeping rec Pulmonary rehab   Virtual Visit via Telephone Note 01/18/2019   I connected with Nicole Spence on 01/18/19 at  825 am  by telephone and verified that I am speaking with the correct person using two identifiers.   I discussed the limitations, risks, security and privacy concerns of performing an evaluation and management service by telephone and the availability  of in person appointments. I also discussed with the patient that there may be a patient responsible charge related to this service. The patient expressed understanding and agreed to proceed.   History of Present Illness:  Dyspnea:  Bicycle x 15 min x 2 sets  3 x weekly on 4lpm with sats 98% Cough: no Sleeping: on 2lpm /up 4 inches SABA use: rarely  02: 2lpm hs and vacuuming and bicycle    No obvious day to day or daytime variability or assoc excess/ purulent sputum or mucus plugs or hemoptysis or cp or chest tightness, subjective wheeze or overt sinus or hb symptoms.    Also denies any obvious fluctuation of symptoms with weather or environmental changes or other aggravating or alleviating factors except as  outlined above.   Meds reviewed/ med reconciliation completed       Observations/Objective: Sounds great / speaking full sentences/ good phonation    Assessment and Plan: See problem list for active a/p's   Follow Up Instructions: See avs for instructions unique to this ov which includes revised/ updated med list     I discussed the assessment and treatment plan with the patient. The patient was provided an opportunity to ask questions and all were answered. The patient agreed with the plan and demonstrated an understanding of the instructions.   The patient was advised to call back or seek an in-person evaluation if the symptoms worsen or if the condition fails to improve as anticipated.  I provided 23 minutes of non-face-to-face time during this encounter.   Christinia Gully, MD

## 2019-01-19 ENCOUNTER — Telehealth (HOSPITAL_COMMUNITY): Payer: Self-pay | Admitting: *Deleted

## 2019-01-19 ENCOUNTER — Telehealth: Payer: Self-pay | Admitting: Internal Medicine

## 2019-01-19 DIAGNOSIS — J9611 Chronic respiratory failure with hypoxia: Secondary | ICD-10-CM

## 2019-01-19 DIAGNOSIS — J449 Chronic obstructive pulmonary disease, unspecified: Secondary | ICD-10-CM

## 2019-01-19 NOTE — Telephone Encounter (Signed)
02 2lpm hs and with exertion and referred to dme for BEST fit for choice for amb 02

## 2019-01-19 NOTE — Telephone Encounter (Signed)
What are pts 02 settings? She was d/c from hospital on 4 liters in Dec.  She says she is on 2 liters qhs and 3 liters with exertion. On 09/03/18 she was walked and placed on 2 liters with exertion during visit.  We will need to place new orders after verifying setttings.   - 09/03/2018   Walked RA x  X 345ft - stopped due to  Sob and desat to 87% corrected on 2lpm at avg pace so rec 02 2lpm hs and with exertion and referred to dme for BEST fit for amb 02    Returned call to [facility]: Pt is on 4 liters continuous  Last walk 12/19 Home fill system with tanks. Pt will need a POC eval for conserving device. They will contact pt           .

## 2019-01-19 NOTE — Telephone Encounter (Signed)
done

## 2019-01-20 ENCOUNTER — Encounter (HOSPITAL_COMMUNITY): Payer: Medicare Other

## 2019-01-25 ENCOUNTER — Encounter (HOSPITAL_COMMUNITY): Payer: Medicare Other

## 2019-02-09 ENCOUNTER — Other Ambulatory Visit: Payer: Self-pay | Admitting: Internal Medicine

## 2019-02-09 MED ORDER — STIOLTO RESPIMAT 2.5-2.5 MCG/ACT IN AERS: 2.0000 | INHALATION_SPRAY | Freq: Every day | RESPIRATORY_TRACT | 3 refills | Status: DC

## 2019-03-09 ENCOUNTER — Telehealth: Payer: Self-pay | Admitting: Internal Medicine

## 2019-03-09 NOTE — Telephone Encounter (Signed)
Call made to [employer], spoke with [place], she states she does not see a order for a POC. I made her aware that I would refax the order. Order printed and refaxed. Will hold in triage and call Melissa with [employer] tomorrow to see if we can have addressed urgently as original order was placed 05/28.   LMTCB with patient.

## 2019-03-10 NOTE — Telephone Encounter (Signed)
Received a response from [place]. Response has been posted below: [place], Samuel [place]  Novelle Addair, Waldemar Dickens, CMA; Julian Hy, Stanford Breed, Melissa        Yes we have received the order you sent yesterday for the POC. We have messaged management for approval for the poc, and will let you know as soon as we hear back on a decision as to whether we can offer her the poc or if she will need to keep her homefill unit.

## 2019-03-10 NOTE — Telephone Encounter (Signed)
I have sent a community message to Nicole Spence with [employer] to check to see if they have received the order that was placed yesterday, 7/15 by [place]. Will update once able.

## 2019-03-10 NOTE — Telephone Encounter (Signed)
Pt returning phone call [phone]

## 2019-03-10 NOTE — Telephone Encounter (Signed)
Spoke with pt, aware of POC status.  Will keep message open to follow up on.

## 2019-03-10 NOTE — Telephone Encounter (Signed)
LMTCB with patient. Please let patient know we are awaiting an update from [employer] and as soon as hear something we will keep her updated as well.

## 2019-03-11 ENCOUNTER — Telehealth (HOSPITAL_COMMUNITY): Payer: Self-pay

## 2019-03-11 NOTE — Telephone Encounter (Addendum)
Updated response from [place] with [employer]: Nicole Spence  Nicole Spence, Nicole Spence, CMA; Nicole Spence, Nicole Spence        Management has advised for [name] to send this to our Respiratory department to do a best fit evaluation.    Nothing further needed.

## 2019-03-11 NOTE — Telephone Encounter (Signed)
Still awaiting response from [employer]. 

## 2019-03-16 ENCOUNTER — Other Ambulatory Visit: Payer: Self-pay | Admitting: Family Medicine

## 2019-03-16 DIAGNOSIS — Z1231 Encounter for screening mammogram for malignant neoplasm of breast: Secondary | ICD-10-CM

## 2019-03-21 ENCOUNTER — Other Ambulatory Visit: Payer: Self-pay

## 2019-03-21 ENCOUNTER — Other Ambulatory Visit (HOSPITAL_COMMUNITY)
Admission: RE | Admit: 2019-03-21 | Discharge: 2019-03-21 | Disposition: A | Discharge status: Home / Self Care | Payer: Medicare Other | Source: Ambulatory Visit | Attending: Family Medicine | Admitting: Family Medicine

## 2019-03-21 ENCOUNTER — Ambulatory Visit (INDEPENDENT_AMBULATORY_CARE_PROVIDER_SITE_OTHER): Payer: Medicare Other | Admitting: Family Medicine

## 2019-03-21 ENCOUNTER — Encounter: Payer: Self-pay | Admitting: Family Medicine

## 2019-03-21 VITALS — BP 116/78 | HR 95 | Temp 98.6°F | Resp 17 | Ht 65.5 in | Wt 194.2 lb

## 2019-03-21 DIAGNOSIS — D2361 Other benign neoplasm of skin of right upper limb, including shoulder: Secondary | ICD-10-CM | POA: Diagnosis not present

## 2019-03-21 DIAGNOSIS — E119 Type 2 diabetes mellitus without complications: Secondary | ICD-10-CM | POA: Diagnosis not present

## 2019-03-21 DIAGNOSIS — I10 Essential (primary) hypertension: Secondary | ICD-10-CM | POA: Diagnosis not present

## 2019-03-21 DIAGNOSIS — E876 Hypokalemia: Secondary | ICD-10-CM | POA: Diagnosis not present

## 2019-03-21 DIAGNOSIS — D229 Melanocytic nevi, unspecified: Secondary | ICD-10-CM | POA: Insufficient documentation

## 2019-03-21 DIAGNOSIS — R7302 Impaired glucose tolerance (oral): Secondary | ICD-10-CM | POA: Diagnosis not present

## 2019-03-21 DIAGNOSIS — R7303 Prediabetes: Secondary | ICD-10-CM | POA: Diagnosis not present

## 2019-03-21 LAB — POCT GLYCOSYLATED HEMOGLOBIN (HGB A1C): Hemoglobin A1C: 6.1 % — AB (ref 4.0–5.6)

## 2019-03-21 MED ORDER — LIDOCAINE-EPINEPHRINE (PF) 1 %-1:200000 IJ SOLN: 2.0000 mL | Freq: Once | INTRAMUSCULAR | Status: DC

## 2019-03-21 NOTE — Progress Notes (Signed)
Procedure Note  Area evaluated and noted to be well circumscribed 22mm mole Explained punch biopsy and described procedure Questions solicited and answered Verbal consent given. Time out performed  Using 1% lidocaine with Epi 62ml total for local anesthesia Area prepped and draped Using a 43mm punch biopsy the area was removed and sent for pathology EBL minimal Patient tolerate the procedure well
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
## 2019-03-21 NOTE — Patient Instructions (Addendum)
If you have lab work done today you will be contacted with your lab results within the next 2 weeks.  If you have not heard from [name] then please contact us. The fastest way to get your results is to register for My Chart.   IF you received an x-ray today, you will receive an invoice from [facility]. Please contact [facility] at [phone] with questions or concerns regarding your invoice.   IF you received labwork today, you will receive an invoice from [facility]. Please contact [facility] at [phone] with questions or concerns regarding your invoice.   Our billing staff will not be able to assist you with questions regarding bills from these companies.  You will be contacted with the lab results as soon as they are available. The fastest way to get your results is to activate your My Chart account. Instructions are located on the last page of this paperwork. If you have not heard from [name] regarding the results in 2 weeks, please contact this office.     Skin Biopsy, Care After This sheet gives you information about how to care for yourself after your procedure. Your health care provider may also give you more specific instructions. If you have problems or questions, contact your health care provider. What can I expect after the procedure? After the procedure, it is common to have:  Soreness.  Bruising.  Itching. Follow these instructions at home: Biopsy site care Follow instructions from your health care provider about how to take care of your biopsy site. Make sure you:  Wash your hands with soap and water before and after you change your bandage (dressing). If soap and water are not available, use hand sanitizer.  Apply ointment on your biopsy site as directed by your health care provider.  Change your dressing as told by your health care provider.  Leave stitches (sutures), skin glue, or adhesive strips in place. These skin closures may need to stay  in place for 2 weeks or longer. If adhesive strip edges start to loosen and curl up, you may trim the loose edges. Do not remove adhesive strips completely unless your health care provider tells you to do that.  If the biopsy area bleeds, apply gentle pressure for 10 minutes. Check your biopsy site every day for signs of infection. Check for:  Redness, swelling, or pain.  Fluid or blood.  Warmth.  Pus or a bad smell.  General instructions  Rest and then return to your normal activities as told by your health care provider.  Take over-the-counter and prescription medicines only as told by your health care provider.  Keep all follow-up visits as told by your health care provider. This is important. Contact a health care provider if:  You have redness, swelling, or pain around your biopsy site.  You have fluid or blood coming from your biopsy site.  Your biopsy site feels warm to the touch.  You have pus or a bad smell coming from your biopsy site.  You have a fever.  Your sutures, skin glue, or adhesive strips loosen or come off sooner than expected. Get help right away if:  You have bleeding that does not stop with pressure or a dressing. Summary  After the procedure, it is common to have soreness, bruising, and itching at the site.  Follow instructions from your health care provider about how to take care of your biopsy site.  Check your biopsy site every day for signs of  infection.  Contact a health care provider if you have redness, swelling, or pain around your biopsy site, or your biopsy site feels warm to the touch.  Keep all follow-up visits as told by your health care provider. This is important. This information is not intended to replace advice given to you by your health care provider. Make sure you discuss any questions you have with your health care provider. Document Released: 09/07/2015 Document Revised: 02/08/2018 Document Reviewed: 02/08/2018 Elsevier  Patient Education  2020 [employer].

## 2019-03-22 LAB — COMPREHENSIVE METABOLIC PANEL
ALT: 13 IU/L (ref 0–32)
AST: 22 IU/L (ref 0–40)
Albumin/Globulin Ratio: 1.6 (ref 1.2–2.2)
Albumin: 4.4 g/dL (ref 3.8–4.8)
Alkaline Phosphatase: 93 IU/L (ref 39–117)
BUN/Creatinine Ratio: 12 (ref 12–28)
BUN: 11 mg/dL (ref 8–27)
Bilirubin Total: 0.5 mg/dL (ref 0.0–1.2)
CO2: 26 mmol/L (ref 20–29)
Calcium: 10.3 mg/dL (ref 8.7–10.3)
Chloride: 100 mmol/L (ref 96–106)
Creatinine, Ser: 0.9 mg/dL (ref 0.57–1.00)
GFR calc Af Amer: 75 mL/min/{1.73_m2} (ref >59)
GFR calc non Af Amer: 65 mL/min/{1.73_m2} (ref >59)
Globulin, Total: 2.8 g/dL (ref 1.5–4.5)
Glucose: 88 mg/dL (ref 65–99)
Potassium: 3.7 mmol/L (ref 3.5–5.2)
Sodium: 143 mmol/L (ref 134–144)
Total Protein: 7.2 g/dL (ref 6.0–8.5)

## 2019-03-22 LAB — CBC
Hematocrit: 39.5 % (ref 34.0–46.6)
Hemoglobin: 13.7 g/dL (ref 11.1–15.9)
MCH: 30 pg (ref 26.6–33.0)
MCHC: 34.7 g/dL (ref 31.5–35.7)
MCV: 87 fL (ref 79–97)
Platelets: 312 10*3/uL (ref 150–450)
RBC: 4.56 x10E6/uL (ref 3.77–5.28)
RDW: 13.1 % (ref 11.7–15.4)
WBC: 8 10*3/uL (ref 3.4–10.8)

## 2019-03-25 ENCOUNTER — Other Ambulatory Visit: Payer: Self-pay

## 2019-03-25 ENCOUNTER — Telehealth: Payer: Self-pay | Admitting: Family Medicine

## 2019-03-25 DIAGNOSIS — R7303 Prediabetes: Secondary | ICD-10-CM

## 2019-03-25 NOTE — Telephone Encounter (Signed)
Copied from [place] [phone] [phone]. Topic: General - Other >> Mar 25, 2019 12:45 PM Burchel, Abbi R wrote: Reason for CRM: Pt states she got a call letting her know that she needs to give a urine sample.  Please call pt to sched: [phone]

## 2019-03-25 NOTE — Telephone Encounter (Signed)
Spoke with pt will come in on 03/28/2019 on nurse list to leave a urine sample for urine microalbumin.  Will put order in. Dgaddy, CMA

## 2019-03-28 ENCOUNTER — Ambulatory Visit (INDEPENDENT_AMBULATORY_CARE_PROVIDER_SITE_OTHER): Payer: Medicare Other | Admitting: Family Medicine

## 2019-03-28 ENCOUNTER — Other Ambulatory Visit: Payer: Self-pay

## 2019-03-28 DIAGNOSIS — R7303 Prediabetes: Secondary | ICD-10-CM | POA: Diagnosis not present

## 2019-03-29 LAB — MICROALBUMIN, URINE: Microalbumin, Urine: 4.2 ug/mL

## 2019-04-29 ENCOUNTER — Ambulatory Visit
Admission: RE | Admit: 2019-04-29 | Discharge: 2019-04-29 | Disposition: A | Discharge status: Home / Self Care | Payer: Medicare Other | Source: Ambulatory Visit | Attending: Family Medicine | Admitting: Family Medicine

## 2019-04-29 ENCOUNTER — Other Ambulatory Visit: Payer: Self-pay

## 2019-04-29 DIAGNOSIS — Z1231 Encounter for screening mammogram for malignant neoplasm of breast: Secondary | ICD-10-CM

## 2019-05-11 ENCOUNTER — Ambulatory Visit (INDEPENDENT_AMBULATORY_CARE_PROVIDER_SITE_OTHER): Payer: Medicare Other | Admitting: Family Medicine

## 2019-05-11 ENCOUNTER — Other Ambulatory Visit: Payer: Self-pay

## 2019-05-11 DIAGNOSIS — Z23 Encounter for immunization: Secondary | ICD-10-CM | POA: Diagnosis not present

## 2019-06-07 NOTE — Progress Notes (Signed)
Discharge Progress Report  Patient Details  Name: Nicole Spence MRN: [id number] Date of Birth: Mar 30, 1946 Referring Provider:     Pulmonary Rehab Walk Test from 10/14/2018 in [place]  Referring Provider  Dr. Melvyn Novas       Number of Visits: 5  Reason for Discharge:  Early Exit:  due to department closure for COVID-19  Smoking History:  Social History   Tobacco Use  Smoking Status Former Smoker  . Packs/day: 1.00  . Years: 30.00  . Pack years: 30.00  . Types: Cigarettes  . Quit date: 10/23/2013  . Years since quitting: 5.6  Smokeless Tobacco Never Used    Diagnosis:  Dyspnea, unspecified type  Chronic respiratory failure with hypoxia (HCC)  ADL UCSD:   Initial Exercise Prescription:   Discharge Exercise Prescription (Final Exercise Prescription Changes):   Functional Capacity:   Psychological, QOL, Others - Outcomes: PHQ 2/9: Depression screen [facility] 2/9 03/21/2019 10/11/2018 09/20/2018 09/20/2018 09/01/2018  Decreased Interest 0 0 0 1 0  Down, Depressed, Hopeless 0 0 0 0 0  PHQ - 2 Score 0 0 0 1 0  Altered sleeping - 0 - - -  Tired, decreased energy - 2 - - -  Change in appetite - 0 - - -  Feeling bad or failure about yourself  - 0 - - -  Trouble concentrating - 0 - - -  Moving slowly or fidgety/restless - 0 - - -  PHQ-9 Score - 2 - - -  Difficult doing work/chores - Not difficult at all - - -    Quality of Life:   Personal Goals: Goals established at orientation with interventions provided to work toward goal.    Personal Goals Discharge:   Exercise Goals and Review:   Exercise Goals Re-Evaluation:   Nutrition & Weight - Outcomes:    Nutrition:   Nutrition Discharge:   Education Questionnaire Score:   Goals reviewed with patient; copy given to patient.

## 2019-06-07 NOTE — Addendum Note (Signed)
Encounter addended by: Lance Morin, RN on: 06/07/2019 4:24 PM  Actions taken: Clinical Note Signed, Episode resolved

## 2019-06-21 DIAGNOSIS — H04123 Dry eye syndrome of bilateral lacrimal glands: Secondary | ICD-10-CM | POA: Diagnosis not present

## 2019-06-21 DIAGNOSIS — Z961 Presence of intraocular lens: Secondary | ICD-10-CM | POA: Diagnosis not present

## 2019-06-21 DIAGNOSIS — E119 Type 2 diabetes mellitus without complications: Secondary | ICD-10-CM | POA: Diagnosis not present

## 2019-06-21 DIAGNOSIS — H35373 Puckering of macula, bilateral: Secondary | ICD-10-CM | POA: Diagnosis not present

## 2019-06-21 DIAGNOSIS — H26492 Other secondary cataract, left eye: Secondary | ICD-10-CM | POA: Diagnosis not present

## 2019-07-22 ENCOUNTER — Other Ambulatory Visit: Payer: Self-pay | Admitting: Family Medicine

## 2019-07-22 NOTE — Telephone Encounter (Signed)
fluticasone (FLONASE) 50 MCG/ACT nasal spray [id number]   Preferred pharmacy  [facility], [place]

## 2019-07-25 ENCOUNTER — Ambulatory Visit (INDEPENDENT_AMBULATORY_CARE_PROVIDER_SITE_OTHER): Payer: Medicare Other | Admitting: Internal Medicine

## 2019-07-25 ENCOUNTER — Other Ambulatory Visit: Payer: Self-pay

## 2019-07-25 ENCOUNTER — Encounter: Payer: Self-pay | Admitting: Internal Medicine

## 2019-07-25 DIAGNOSIS — J449 Chronic obstructive pulmonary disease, unspecified: Secondary | ICD-10-CM

## 2019-07-25 DIAGNOSIS — J9611 Chronic respiratory failure with hypoxia: Secondary | ICD-10-CM

## 2019-07-25 MED ORDER — STIOLTO RESPIMAT 2.5-2.5 MCG/ACT IN AERS: 2.0000 | INHALATION_SPRAY | Freq: Every day | RESPIRATORY_TRACT | 0 refills | Status: DC

## 2019-07-25 MED ORDER — FLUTICASONE PROPIONATE 50 MCG/ACT NA SUSP: 2.0000 | Freq: Every day | NASAL | 1 refills | Status: DC

## 2019-07-25 NOTE — Progress Notes (Signed)
Subjective:    Patient ID: Nicole Spence, female   DOB: 04/21/1946,    MRN: [id number]    Brief patient profile:  28  yobf  Quit smoking 2015 noted onset of doe age [age] with sense of nasal drainage/ sore throat and eventually placed on spiriva then added symbicort but actually much better since started reflux meds early April 2019 and referred to pulmonary clinic 12/18/2017 by Dr   Reginia Forts p ent eval around 2017 pos gerd but had stopped PPI due to reported potential side effects in media .    History of Present Illness  12/18/2017 1st [facility] office visit/ Nicole Spence   Chief Complaint  Patient presents with  . Pulmonary Consult    Referred by Dr. Windell Moment for eval of COPD. Pt states she was told years ago she had chronic bronchitis "but it never really bothered me". She has SOB with exertion such as cleaning her house or washing her car. She has an albuterol inhaler that she rarely uses.   doe indolent onset minimal progressive since age 83  Esp carrying groceries in from car has trouble with steps and walks slower than avg people never tries saba [facility] = can't walk a nl pace on a flat grade s sob but does fine slow and flat  Takes  symbicort x 2 first thing am/ then spriva lunchtime and then symb 160   Cough better on otc gerd rx  Sleep ok  rec Stop spiriva and symbicort  Plan A = Automatic = stiolto 2 pffs each am  And continue the omeprazole Take 30-60 min before first meal of the day  Work on inhaler technique:   Plan B = Backup Only use your albuterol as a rescue medication   GERD diet      Admit date: 08/20/2018 Discharge date: 08/23/2018   Equipment/Devices: Oxygen via nasal cannula 4 L/min continuously.     Discharge Diagnoses:  Principal Problem:   CAP (community acquired pneumonia):   Essential hypertension   COPD GOLD I   Hypokalemia   Brief Summary: 73 year old female, lives alone, independent, PMH of COPD not on home oxygen, former  smoker, HTN, presented to ED on 08/20/2018 due to productive cough, dyspnea, subjective fevers for which she had gone to an urgent care where she was found to be hypoxic with oxygen saturation in the 70s and was referred to the ED for evaluation. She was admitted for community-acquired pneumonia, COPD exacerbation and acute hypoxic respiratory failure.   Assessment & Plan:  1. Lobar pneumonia (right upper lobe)/community-acquired pneumonia:Influenza panel PCR and urinary pneumococcal and Legionella antigen negative. Lactate and BNP normal. Empirically started on ceftriaxone and azithromycin and completed 3 days course. Recommend repeating chest x-ray in 4 weeks to ensure resolution of pneumonia findings. Blood cultures x2: Negative to date.  Clinically improved. Transitioned to Augmentin to complete total 7 days course.  Sputum culture shows normal respiratory flora 2. COPD exacerbation: She received a dose of IV Solu-Medrol in ED.  She did not have clinical bronchospasm after that and hence steroids were not continued.  However she remained persistently hypoxic yesterday and thereby suspecting some degree of COPD exacerbation without wheezing or rhonchi, initiated oral prednisone taper.  Complete course of antibiotics and steroid taper as outpatient.  Feels much better.  Former heavy smoker.    3. Acute respiratory failure with hypoxia: Not on home oxygen PTA. Now precipitated by pneumonia and COPD exacerbation.   Reassess today when patient  meets requirement for home oxygen and will be discharged on 4 L/min. Close outpatient follow-up with PCP at which time can determine if she can be weaned off of oxygen. 4. Hypokalemia:Replaced. Magnesium 2.5.  5. Essential hypertension:Controlled. HCTZ temporarily held while hospitalized will be resumed at discharge.      09/03/2018  f/u ov/Nicole Spence re: copd s/p pna/ off prednisone and abx x  5 days still using stiolto plus saba qid "they told me  to at discharge"  Chief Complaint  Patient presents with  . Hospitalization Follow-up    Pt was admitted to hospital for PNA and COPD flare 08/20/18-08/23/18.  She was sent home on o2 and is currently using 2-3lpm 24/7.  She states her breathing has improved since hospital d/c. She has occ cough with clear sputum but this is improving. She is having occ CP relieved by belching. She is using her albuterol inhaler 4 x daily.  Dyspnea:  MMRC2 = can't walk a nl pace on a flat grade s sob but does fine slow and flat eg [employer] s 02 but could feel it when got to car  Cough: minimal in am  Sleeping: bed blocks plus pillows  SABA use: 4 x daily  02: 2lpm 24/7  rec Plan A = Automatic =  Stiolto 2 puffs each am  Work on inhaler technique:   Plan B = Backup Only use your albuterol inhaler as a rescue medication  02 2lpm at bedtime and not needed at rest sitting  When you walk for exercise then goal is 90% or higher so either walk slower or wear 02 adjusted to keep sats over 90%  Please see patient coordinator before you leave today  to schedule BEST FIT evaluation for ambulatory 02      10/20/2018  f/u ov/Nicole Spence re: copd gold I / 02 at hs and ex  But not at rest @ 2lpm  Chief Complaint  Patient presents with  . Follow-up    Breathing has improved some since the last visit.   Dyspnea:  MMRC2 = can't walk a nl pace on a flat grade s sob but does fine slow and flat eg walking at [employer] pushing the cart s 02 not checking  Cough: no Sleeping: side on bed on 4 inches SABA use: not at all  02: 2lpm with house work / sleeping    Virtual Visit via Telephone Note 01/18/2019  History of Present Illness:  Dyspnea:  Bicycle x 15 min x 2 sets  3 x weekly on 4lpm with sats 98% Cough: no Sleeping: on 2lpm /up 4 inches SABA use: rarely  02: 2lpm hs and vacuuming and bicycle  rec\We will check to see if you can get a more portable system so you can walk outside  No change in meds:  Continue  stiolto as you are  Only use your albuterol (ventolin) as a rescue medication Work on pushing the aerobic part of your workout harder    07/25/2019  f/u ov/Nicole Spence re:  GOLD I on stiolto / new sciatica/R plantar fasciitis limiting  Chief Complaint  Patient presents with  . Follow-up    Breathing is doing well and she has not been using her albuterol recently. No new co'.s    Dyspnea:  R leg limiting  Cough: none  Sleeping: 4 in elevation HOB/ 2pm hs  SABA use: none 02: 2lpm  Hs,  And none at rest, up to 3 lpm with activity    No obvious day  to day or daytime variability or assoc excess/ purulent sputum or mucus plugs or hemoptysis or cp or chest tightness, subjective wheeze or overt sinus or hb symptoms.   Sleeping as above  without nocturnal  or early am exacerbation  of respiratory  c/o's or need for noct saba. Also denies any obvious fluctuation of symptoms with weather or environmental changes or other aggravating or alleviating factors except as outlined above   No unusual exposure hx or h/o childhood pna/ asthma or knowledge of premature birth.  Current Allergies, Complete Past Medical History, Past Surgical History, Family History, and Social History were reviewed in [employer] record.  ROS  The following are not active complaints unless bolded Hoarseness, sore throat, dysphagia, dental problems, itching, sneezing,  nasal congestion or discharge of excess mucus or purulent secretions, ear ache,   fever, chills, sweats, unintended wt loss or wt gain, classically pleuritic or exertional cp,  orthopnea pnd or arm/hand swelling  or leg swelling, presyncope, palpitations, abdominal pain, anorexia, nausea, vomiting, diarrhea  or change in bowel habits or change in bladder habits, change in stools or change in urine, dysuria, hematuria,  rash, arthralgias, visual complaints, headache, numbness, weakness or ataxia or problems with walking or coordination,  change in  mood or  memory.        Current Meds  Medication Sig  . albuterol (PROVENTIL HFA;VENTOLIN HFA) 108 (90 Base) MCG/ACT inhaler Inhale 2 puffs into the lungs every 4 (four) hours as needed for wheezing or shortness of breath (cough, shortness of breath or wheezing.).  Marland Kitchen famotidine (PEPCID) 20 MG tablet One twice daily after bfast and after supper  . fluticasone (FLONASE) 50 MCG/ACT nasal spray USE 2 SPRAYS IN EACH  NOSTRIL DAILY  . hydrochlorothiazide (HYDRODIURIL) 25 MG tablet Take 1 tablet (25 mg total) by mouth daily.  Marland Kitchen loratadine (CLARITIN) 10 MG tablet Take 1 tablet (10 mg total) by mouth daily.  . Multiple Vitamins-Minerals (WOMENS 50+ MULTI VITAMIN/MIN) TABS Take by mouth daily.  . OXYGEN 2lpm with sleep and 3lpm with exertion AHC  . potassium chloride SA (K-DUR,KLOR-CON) 20 MEQ tablet Take 1 tablet (20 mEq total) by mouth daily.  . Tiotropium Bromide-Olodaterol (STIOLTO RESPIMAT) 2.5-2.5 MCG/ACT AERS Inhale 2 puffs into the lungs daily.                     Objective:   Physical Exam  amb bf nad   07/25/2019         201  10/20/2018          189  09/03/2018          186  08/02/2018        189   01/29/2018         185   12/18/17 183 lb (83 kg)  11/18/17 183 lb (83 kg)  10/12/17 183 lb (83 kg)      Vital signs reviewed - Note on arrival 02 sats  97% on 2lpm pulsed    HEENT : pt wearing mask not removed for exam due to covid - 19 concerns.   NECK :  without JVD/Nodes/TM/ nl carotid upstrokes bilaterally   LUNGS: no acc muscle use,  Min barrel  contour chest wall with bilateral  slightly decreased bs s audible wheeze and  without cough on insp or exp maneuvers and min  Hyperresonant  to  percussion bilaterally     CV:  RRR  no s3 or murmur or increase in  P2, and no edema   ABD:  soft and nontender with pos end  insp Hoover's  in the supine position. No bruits or organomegaly appreciated, bowel sounds nl  MS:   Nl gait/  ext warm without deformities, calf tenderness,  cyanosis or clubbing No obvious joint restrictions   SKIN: warm and dry without lesions    NEURO:  alert, approp, nl sensorium with  no motor or cerebellar deficits apparent.             Assessment:

## 2019-07-25 NOTE — Patient Instructions (Signed)
No need for 02 at rest unless sats are less than 90%  Please schedule a follow up visit in 6  months but call sooner if needed

## 2019-07-25 NOTE — Telephone Encounter (Signed)
I have sent the rx to the pharmacy

## 2019-07-26 ENCOUNTER — Encounter: Payer: Self-pay | Admitting: Internal Medicine

## 2019-07-26 NOTE — Assessment & Plan Note (Addendum)
08/02/2018   Room Air at Rest = 93%  >> Room Air while Ambulating = 83% Patient Saturations on 2 Liters of oxygen while Ambulating = 94%>  rx 2lpm with activity  - placed on 24 h 02 at d/c from [facility] p dx pna 08/23/18 - refer for BEST fit for amb 02  09/03/2018  - 09/03/2018   Walked RA x  X 348ft - stopped due to  Sob and desat to 87% corrected on 2lpm at avg pace so rec 02 2lpm hs and with exertion and referred to dme for BEST fit for amb 02 > not done - referred again for best fit 01/18/2019 > 2lpm pulsed tanks  Advised: Make sure you check your oxygen saturations at highest level of activity to be sure it stays over 90% and adjust upward to maintain this level if needed but remember to turn it back to previous settings when you stop (to conserve your supply).  Goal is to keep sats > 90% on lowest portable setting needed and unlikely to need at rest.   I had an extended discussion with the patient reviewing all relevant studies completed to date and  lasting 15 to 20 minutes of a 25 minute visit    Each maintenance medication was reviewed in detail including most importantly the difference between maintenance and prns and under what circumstances the prns are to be triggered using an action plan format that is not reflected in the computer generated alphabetically organized AVS.     Please see AVS for specific instructions unique to this visit that I personally wrote and verbalized to the the pt in detail and then reviewed with pt  by my nurse highlighting any  changes in therapy recommended at today's visit to their plan of care.

## 2019-07-26 NOTE — Assessment & Plan Note (Signed)
Quit smoking 2015 - 12/18/2017  Walked RA x 3 laps @ 185 ft each stopped due to  End of study, fast pace, sats 90% at end  -  Spirometry 12/18/2017  FEV1 1.63 (83%)  Ratio 65 - 12/18/2017  > changed from  symbicort /spiriva dpi to stiolto - PFT's  01/29/2018  FEV1 1.81  (87 % ) ratio 67  p 16 % improvement from saba p nothing prior to study with DLCO  32 % corrects to 46  % for alv volume   - 09/03/2018  After extensive coaching inhaler device,  effectiveness =    90% with smi  - starting rehab 10/21/18 > did x 3 weeks prior to covid then stopped   Pt is Group B in terms of symptom/risk and laba/lama therefore appropriate rx at this point >>>  approp for stiolto.  Unfortunately due to restrictions from covid 19 and R leg/foot pain very limited with activity and gaining wt/ advised this will compromise her breathing further regardless of status of airways.  Pt informed of the seriousness of COVID 19 infection as a direct risk to their health  and safey and to those of their loved ones and should continue to wear facemask in public and minimize exposure to public locations but especially avoid any area or activity where non-close contacts are not observing distancing or wearing an appropriate face mask.   >>> f/u at 6 m advised to avoid routine ov's this winter

## 2019-08-29 ENCOUNTER — Other Ambulatory Visit: Payer: Self-pay | Admitting: Internal Medicine

## 2019-09-23 ENCOUNTER — Ambulatory Visit: Payer: Medicare Other | Admitting: *Deleted

## 2019-09-23 ENCOUNTER — Encounter: Payer: Self-pay | Admitting: Family Medicine

## 2019-09-23 ENCOUNTER — Ambulatory Visit (INDEPENDENT_AMBULATORY_CARE_PROVIDER_SITE_OTHER): Payer: Medicare Other | Admitting: Family Medicine

## 2019-09-23 ENCOUNTER — Other Ambulatory Visit: Payer: Self-pay

## 2019-09-23 VITALS — BP 110/68 | HR 90 | Temp 97.9°F | Resp 18 | Ht 66.0 in | Wt 205.0 lb

## 2019-09-23 VITALS — BP 110/68 | HR 90 | Temp 97.9°F | Ht 66.0 in | Wt 205.0 lb

## 2019-09-23 DIAGNOSIS — J441 Chronic obstructive pulmonary disease with (acute) exacerbation: Secondary | ICD-10-CM

## 2019-09-23 DIAGNOSIS — Z0000 Encounter for general adult medical examination without abnormal findings: Secondary | ICD-10-CM

## 2019-09-23 DIAGNOSIS — J449 Chronic obstructive pulmonary disease, unspecified: Secondary | ICD-10-CM

## 2019-09-23 DIAGNOSIS — R7301 Impaired fasting glucose: Secondary | ICD-10-CM

## 2019-09-23 DIAGNOSIS — I10 Essential (primary) hypertension: Secondary | ICD-10-CM | POA: Diagnosis not present

## 2019-09-23 DIAGNOSIS — J9611 Chronic respiratory failure with hypoxia: Secondary | ICD-10-CM | POA: Diagnosis not present

## 2019-09-23 DIAGNOSIS — R0902 Hypoxemia: Secondary | ICD-10-CM | POA: Diagnosis not present

## 2019-09-23 MED ORDER — ALBUTEROL SULFATE HFA 108 (90 BASE) MCG/ACT IN AERS: 2.0000 | INHALATION_SPRAY | RESPIRATORY_TRACT | 3 refills | Status: DC | PRN

## 2019-09-23 MED ORDER — POTASSIUM CHLORIDE CRYS ER 20 MEQ PO TBCR: 20.0000 meq | EXTENDED_RELEASE_TABLET | Freq: Every day | ORAL | 3 refills | Status: DC

## 2019-09-23 MED ORDER — FLUTICASONE PROPIONATE 50 MCG/ACT NA SUSP: 2.0000 | Freq: Every day | NASAL | 1 refills | Status: DC

## 2019-09-23 MED ORDER — HYDROCHLOROTHIAZIDE 25 MG PO TABS: 25.0000 mg | ORAL_TABLET | Freq: Every day | ORAL | 3 refills | Status: DC

## 2019-09-23 NOTE — Patient Instructions (Signed)
Thank you for taking time to come for your Medicare Wellness Visit. I appreciate your ongoing commitment to your health goals. Please review the following plan we discussed and let me know if I can assist you in the future.  Tavio Biegel LPN  Preventive Care 72 Years and Older, Female Preventive care refers to lifestyle choices and visits with your health care provider that can promote health and wellness. This includes:  A yearly physical exam. This is also called an annual well check.  Regular dental and eye exams.  Immunizations.  Screening for certain conditions.  Healthy lifestyle choices, such as diet and exercise. What can I expect for my preventive care visit? Physical exam Your health care provider will check:  Height and weight. These may be used to calculate body mass index (BMI), which is a measurement that tells if you are at a healthy weight.  Heart rate and blood pressure.  Your skin for abnormal spots. Counseling Your health care provider may ask you questions about:  Alcohol, tobacco, and drug use.  Emotional well-being.  Home and relationship well-being.  Sexual activity.  Eating habits.  History of falls.  Memory and ability to understand (cognition).  Work and work environment.  Pregnancy and menstrual history. What immunizations do I need?  Influenza (flu) vaccine  This is recommended every year. Tetanus, diphtheria, and pertussis (Tdap) vaccine  You may need a Td booster every 10 years. Varicella (chickenpox) vaccine  You may need this vaccine if you have not already been vaccinated. Zoster (shingles) vaccine  You may need this after age 60. Pneumococcal conjugate (PCV13) vaccine  One dose is recommended after age 65. Pneumococcal polysaccharide (PPSV23) vaccine  One dose is recommended after age 65. Measles, mumps, and rubella (MMR) vaccine  You may need at least one dose of MMR if you were born in 1957 or later. You may also  need a second dose. Meningococcal conjugate (MenACWY) vaccine  You may need this if you have certain conditions. Hepatitis A vaccine  You may need this if you have certain conditions or if you travel or work in places where you may be exposed to hepatitis A. Hepatitis B vaccine  You may need this if you have certain conditions or if you travel or work in places where you may be exposed to hepatitis B. Haemophilus influenzae type b (Hib) vaccine  You may need this if you have certain conditions. You may receive vaccines as individual doses or as more than one vaccine together in one shot (combination vaccines). Talk with your health care provider about the risks and benefits of combination vaccines. What tests do I need? Blood tests  Lipid and cholesterol levels. These may be checked every 5 years, or more frequently depending on your overall health.  Hepatitis C test.  Hepatitis B test. Screening  Lung cancer screening. You may have this screening every year starting at age 55 if you have a 30-pack-year history of smoking and currently smoke or have quit within the past 15 years.  Colorectal cancer screening. All adults should have this screening starting at age 50 and continuing until age 75. Your health care provider may recommend screening at age 45 if you are at increased risk. You will have tests every 1-10 years, depending on your results and the type of screening test.  Diabetes screening. This is done by checking your blood sugar (glucose) after you have not eaten for a while (fasting). You may have this done every 1-3   years.  Mammogram. This may be done every 1-2 years. Talk with your health care provider about how often you should have regular mammograms.  BRCA-related cancer screening. This may be done if you have a family history of breast, ovarian, tubal, or peritoneal cancers. Other tests  Sexually transmitted disease (STD) testing.  Bone density scan. This is done  to screen for osteoporosis. You may have this done starting at age 94. Follow these instructions at home: Eating and drinking  Eat a diet that includes fresh fruits and vegetables, whole grains, lean protein, and low-fat dairy products. Limit your intake of foods with high amounts of sugar, saturated fats, and salt.  Take vitamin and mineral supplements as recommended by your health care provider.  Do not drink alcohol if your health care provider tells you not to drink.  If you drink alcohol: ? Limit how much you have to 0-1 drink a day. ? Be aware of how much alcohol is in your drink. In the [place] one drink equals one 12 oz bottle of beer (355 mL), one 5 oz glass of wine (148 mL), or one 1 oz glass of hard liquor (44 mL). Lifestyle  Take daily care of your teeth and gums.  Stay active. Exercise for at least 30 minutes on 5 or more days each week.  Do not use any products that contain nicotine or tobacco, such as cigarettes, e-cigarettes, and chewing tobacco. If you need help quitting, ask your health care provider.  If you are sexually active, practice safe sex. Use a condom or other form of protection in order to prevent STIs (sexually transmitted infections).  Talk with your health care provider about taking a low-dose aspirin or statin. What's next?  Go to your health care provider once a year for a well check visit.  Ask your health care provider how often you should have your eyes and teeth checked.  Stay up to date on all vaccines. This information is not intended to replace advice given to you by your health care provider. Make sure you discuss any questions you have with your health care provider. Document Revised: 08/05/2018 Document Reviewed: 08/05/2018 Elsevier Patient Education  2020 [employer].
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
## 2019-09-24 LAB — CBC WITH DIFFERENTIAL/PLATELET
Basophils Absolute: 0.1 10*3/uL (ref 0.0–0.2)
Basos: 1 %
EOS (ABSOLUTE): 0.2 10*3/uL (ref 0.0–0.4)
Eos: 2 %
Hematocrit: 44.4 % (ref 34.0–46.6)
Hemoglobin: 15 g/dL (ref 11.1–15.9)
Immature Grans (Abs): 0 10*3/uL (ref 0.0–0.1)
Immature Granulocytes: 0 %
Lymphocytes Absolute: 2.1 10*3/uL (ref 0.7–3.1)
Lymphs: 28 %
MCH: 30.3 pg (ref 26.6–33.0)
MCHC: 33.8 g/dL (ref 31.5–35.7)
MCV: 90 fL (ref 79–97)
Monocytes Absolute: 0.5 10*3/uL (ref 0.1–0.9)
Monocytes: 7 %
Neutrophils Absolute: 4.7 10*3/uL (ref 1.4–7.0)
Neutrophils: 62 %
Platelets: 294 10*3/uL (ref 150–450)
RBC: 4.95 x10E6/uL (ref 3.77–5.28)
RDW: 13.1 % (ref 11.7–15.4)
WBC: 7.6 10*3/uL (ref 3.4–10.8)

## 2019-09-24 LAB — COMPREHENSIVE METABOLIC PANEL
ALT: 14 IU/L (ref 0–32)
AST: 25 IU/L (ref 0–40)
Albumin/Globulin Ratio: 1.4 (ref 1.2–2.2)
Albumin: 4.2 g/dL (ref 3.7–4.7)
Alkaline Phosphatase: 116 IU/L (ref 39–117)
BUN/Creatinine Ratio: 11 — ABNORMAL LOW (ref 12–28)
BUN: 10 mg/dL (ref 8–27)
Bilirubin Total: 0.5 mg/dL (ref 0.0–1.2)
CO2: 24 mmol/L (ref 20–29)
Calcium: 9.9 mg/dL (ref 8.7–10.3)
Chloride: 99 mmol/L (ref 96–106)
Creatinine, Ser: 0.95 mg/dL (ref 0.57–1.00)
GFR calc Af Amer: 70 mL/min/{1.73_m2} (ref >59)
GFR calc non Af Amer: 60 mL/min/{1.73_m2} (ref >59)
Globulin, Total: 3.1 g/dL (ref 1.5–4.5)
Glucose: 88 mg/dL (ref 65–99)
Potassium: 3.7 mmol/L (ref 3.5–5.2)
Sodium: 141 mmol/L (ref 134–144)
Total Protein: 7.3 g/dL (ref 6.0–8.5)

## 2019-09-24 LAB — HEMOGLOBIN A1C
Est. average glucose Bld gHb Est-mCnc: 126 mg/dL
Hgb A1c MFr Bld: 6 % — ABNORMAL HIGH (ref 4.8–5.6)

## 2019-10-15 ENCOUNTER — Ambulatory Visit: Payer: Medicare Other | Attending: Internal Medicine

## 2019-10-15 DIAGNOSIS — Z23 Encounter for immunization: Secondary | ICD-10-CM

## 2019-10-15 NOTE — Progress Notes (Signed)
   Covid-19 Vaccination Clinic  Name:  Jassmin Macon    MRN: [id number] DOB: Jan 19, 1946  10/15/2019  Ms. Overbaugh was observed post Covid-19 immunization for 15 minutes without incidence. She was provided with Vaccine Information Sheet and instruction to access the V-Safe system.   Ms. Merriam was instructed to call 911 with any severe reactions post vaccine: Marland Kitchen Difficulty breathing  . Swelling of your face and throat  . A fast heartbeat  . A bad rash all over your body  . Dizziness and weakness    Immunizations Administered    Name Date Dose VIS Date Route   Pfizer COVID-19 Vaccine 10/15/2019  2:44 PM 0.3 mL 08/05/2019 Intramuscular   Manufacturer: [place]   Lot: [id number]   [place]: [id number]

## 2019-11-08 ENCOUNTER — Ambulatory Visit: Payer: Medicare Other | Attending: Internal Medicine

## 2019-11-08 DIAGNOSIS — Z23 Encounter for immunization: Secondary | ICD-10-CM

## 2019-11-08 NOTE — Progress Notes (Signed)
   Covid-19 Vaccination Clinic  Name:  Isyss Patella    MRN: [id number] DOB: March 09, 1946  11/08/2019  Ms. Downum was observed post Covid-19 immunization for 15 minutes without incident. She was provided with Vaccine Information Sheet and instruction to access the V-Safe system.   Ms. Brace was instructed to call 911 with any severe reactions post vaccine: Marland Kitchen Difficulty breathing  . Swelling of face and throat  . A fast heartbeat  . A bad rash all over body  . Dizziness and weakness   Immunizations Administered    Name Date Dose VIS Date Route   Pfizer COVID-19 Vaccine 11/08/2019  1:36 PM 0.3 mL 08/05/2019 Intramuscular   Manufacturer: [place]   Lot: [id number]   [place]: [id number]

## 2019-12-27 IMAGING — MG MM DIGITAL SCREENING BILAT W/ TOMO W/ CAD
6 of 10 series · 6 of 30 positions shown · non-contrast
Comparison: Previous exam(s).

CLINICAL DATA: Screening.

EXAM:
DIGITAL SCREENING BILATERAL MAMMOGRAM WITH TOMO AND CAD

[R CC synth-2D]
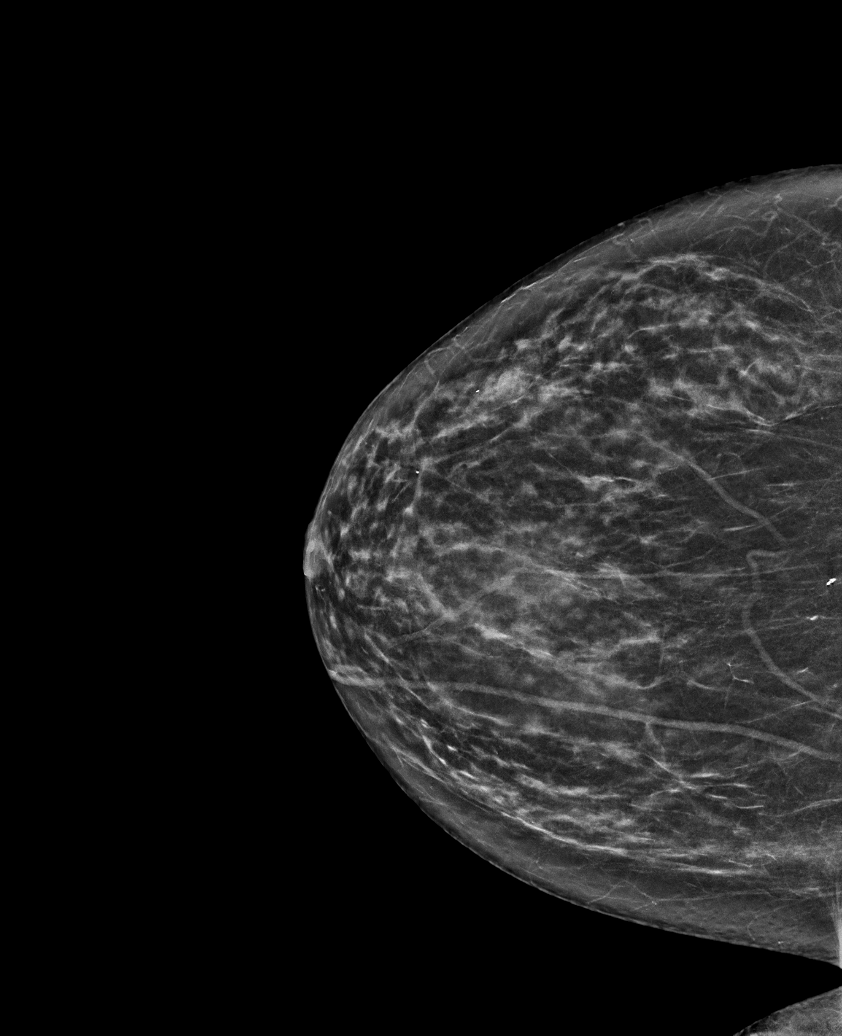

[R MLO synth-2D]
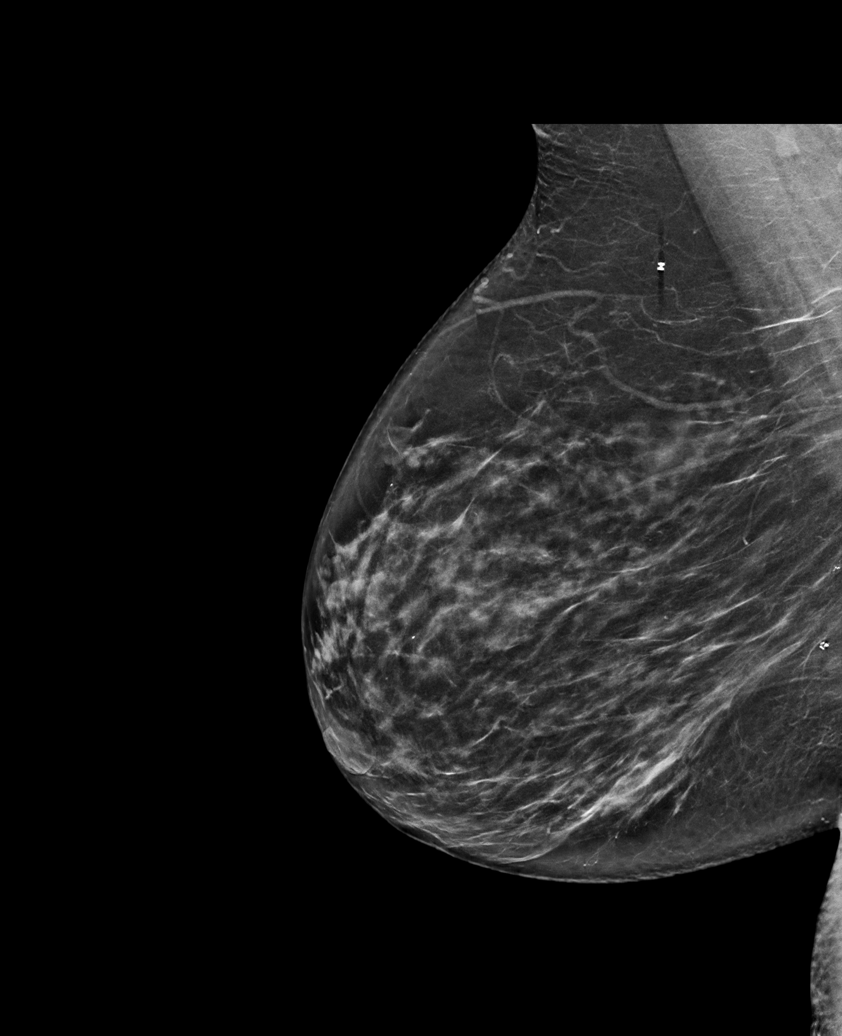

[L MLO synth-2D]
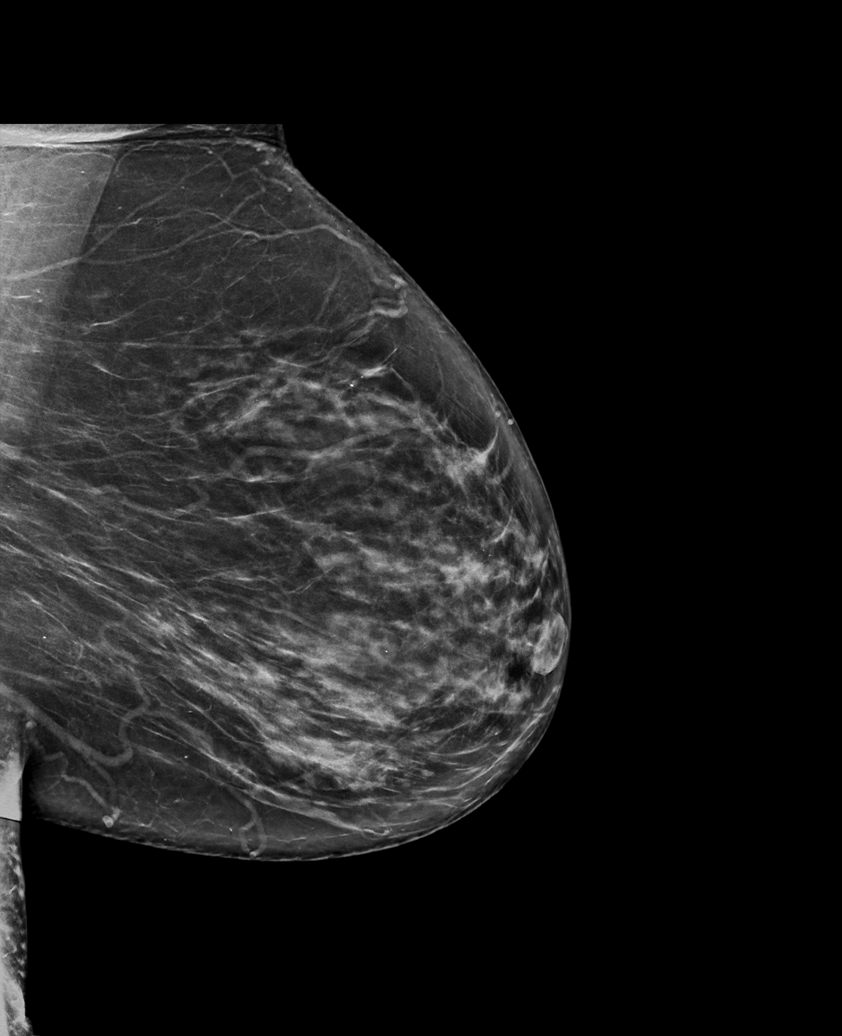

[R XCCL synth-2D]
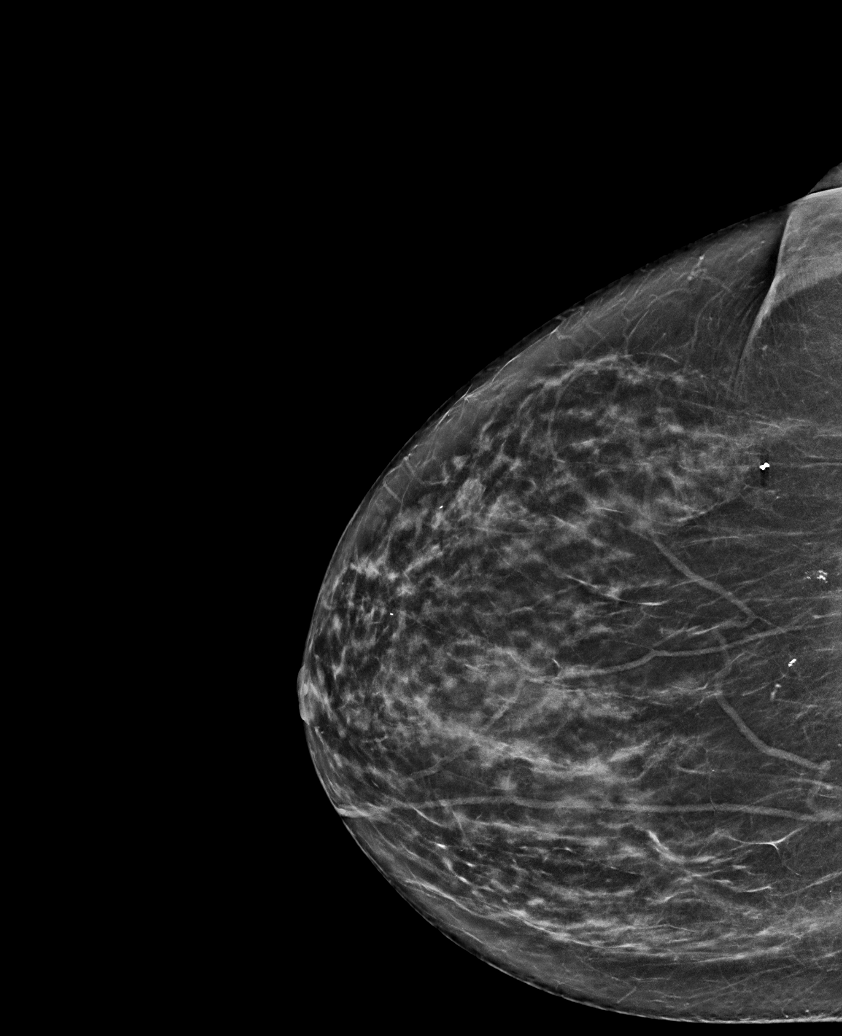

[L CC synth-2D]
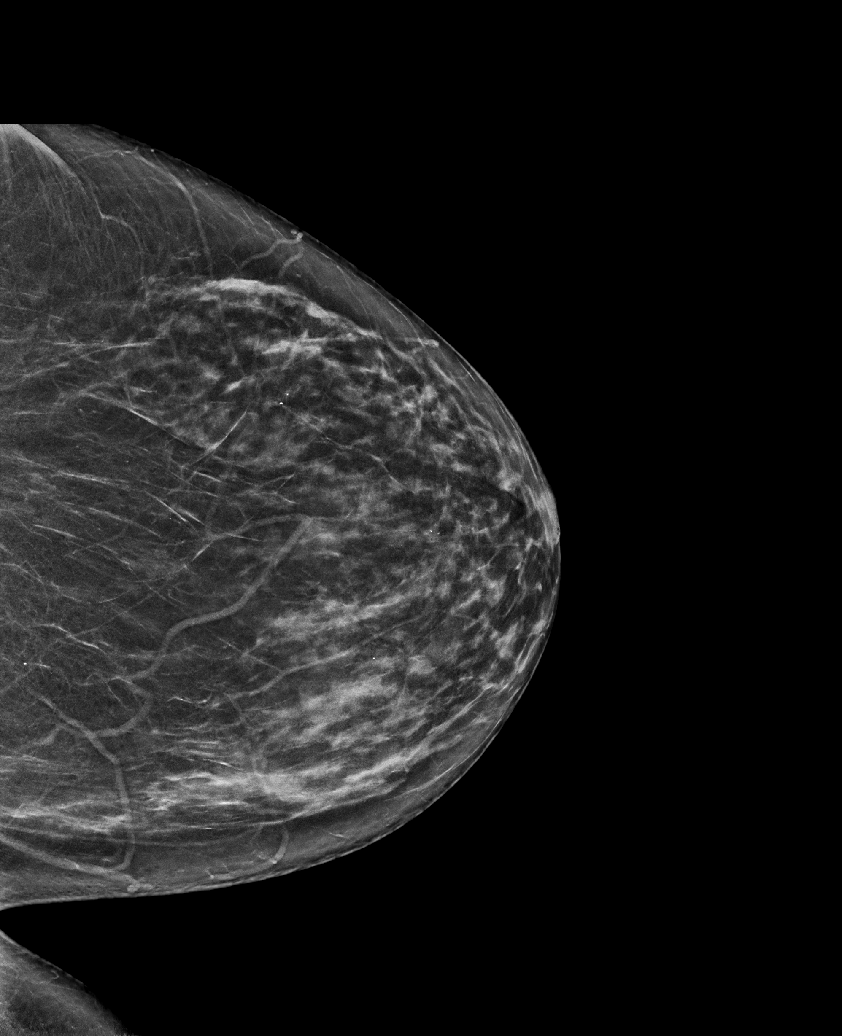

[R MLO tomo · tomo slice 36/71.0]
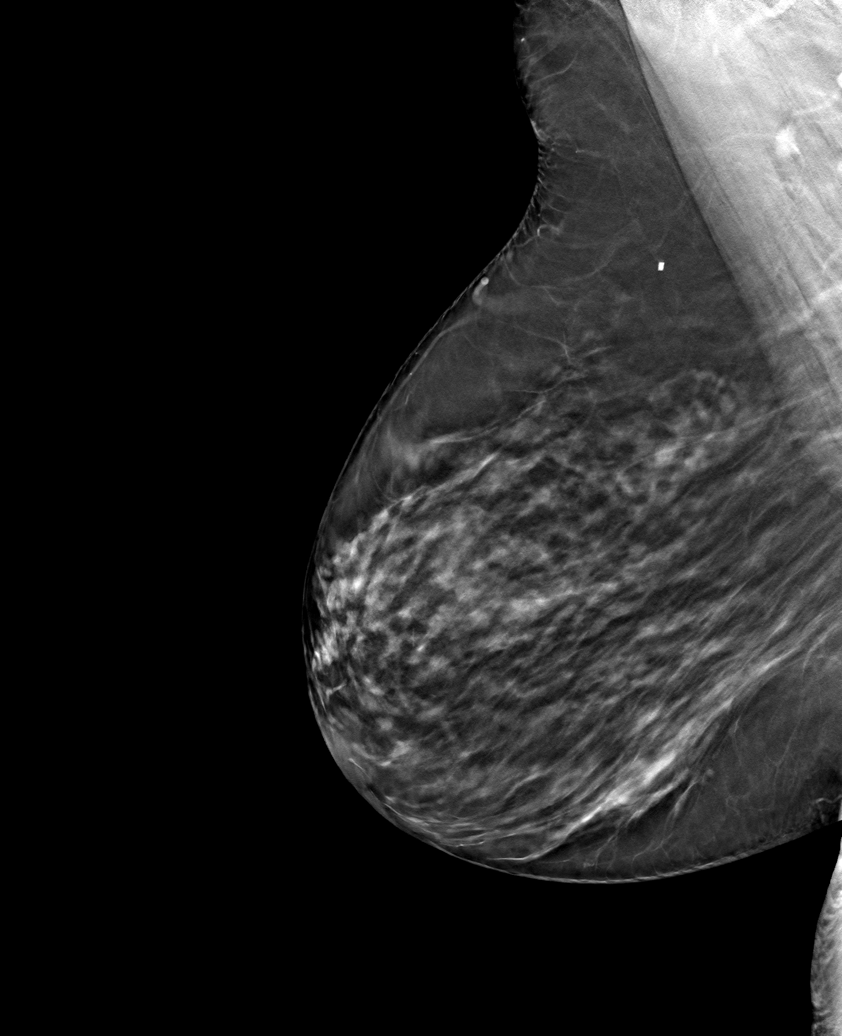

[6 of 30 positions shown; findings below may reference images not displayed]

ACR Breast Density Category c: The breast tissue is heterogeneously
dense, which may obscure small masses.
FINDINGS: There are no findings suspicious for malignancy. Images were
processed with CAD.
IMPRESSION: No mammographic evidence of malignancy. A result letter of this
screening mammogram will be mailed directly to the patient.

RECOMMENDATION:
Screening mammogram in one year. (Code:FT-U-LHB)

BI-RADS CATEGORY  1: Negative.

## 2020-01-09 ENCOUNTER — Other Ambulatory Visit: Payer: Self-pay

## 2020-01-09 ENCOUNTER — Encounter: Payer: Self-pay | Admitting: Primary Care

## 2020-01-09 ENCOUNTER — Ambulatory Visit (INDEPENDENT_AMBULATORY_CARE_PROVIDER_SITE_OTHER): Payer: Medicare Other | Admitting: Primary Care

## 2020-01-09 VITALS — BP 138/74 | HR 102 | Temp 97.3°F | Ht 65.0 in | Wt 196.6 lb

## 2020-01-09 DIAGNOSIS — Z87891 Personal history of nicotine dependence: Secondary | ICD-10-CM

## 2020-01-09 DIAGNOSIS — J9611 Chronic respiratory failure with hypoxia: Secondary | ICD-10-CM

## 2020-01-09 DIAGNOSIS — J449 Chronic obstructive pulmonary disease, unspecified: Secondary | ICD-10-CM | POA: Diagnosis not present

## 2020-01-09 MED ORDER — STIOLTO RESPIMAT 2.5-2.5 MCG/ACT IN AERS: 2.0000 | INHALATION_SPRAY | Freq: Every day | RESPIRATORY_TRACT | 0 refills | Status: DC

## 2020-01-09 MED ORDER — FLUTTER DEVI: 0 refills | Status: AC

## 2020-01-09 NOTE — Progress Notes (Signed)
@Patient  ID: Nicole Spence, female    DOB: April 26, 1946, 74 y.o.   MRN: [id number]  Chief Complaint  Patient presents with  . Follow-up    Sob-same with exertion    Referring provider: Forrest Moron, MD  Brief patient profile:  Quit smoking 2015 noted onset of doe age [age] with sense of nasal drainage/ sore throat and eventually placed on spiriva then added symbicort but actually much better since started reflux meds early April 2019 and referred to pulmonary clinic 12/18/2017 by Dr   Reginia Forts p ent eval around 2017 pos gerd but had stopped PPI due to reported potential side effects in media .  HPI: 74 year old, former smoker quit in 2015 (30-pack-year history).  Past medical history significant for COPD Gold I, chronic respiratory failure with hypoxia, seasonal allergic rhinitis, community-acquired pneumonia, essential hypertension, obesity.  Patient of Dr. Melvyn Novas, last seen on 07/25/19. Maintained on Stiolto Respimat.   01/09/2020 Patient presents today for 6 months follow-up. States that her breathing is baseline. She is compliant with stiolto using it once daily in the morning. Rare SABA use. She wakes up with some morning congestion. She is on 2L continuous, uses 3L on exertion. She remains active with cycling and stretching. She uses 4L when exercising.  She is up to date with COVID vaccine.   Pulmonary function testing: PFT's  01/29/2018  FEV1 1.81  (87 % ) ratio 67  p 16 % improvement from saba p nothing prior to study with DLCO  32 % corrects to 46  % for alv volume     Allergies  Allergen Reactions  . Adhesive [Tape] Other (See Comments)    IRRITATES SKIN     Immunization History  Administered Date(s) Administered  . Fluad Quad(high Dose 65+) 05/11/2019  . Influenza, High Dose Seasonal PF 09/20/2018  . Influenza,inj,Quad PF,6+ Mos 05/04/2014, 09/21/2015, 06/17/2016  . Influenza-Unspecified 04/26/2019  . PFIZER SARS-COV-2 Vaccination 10/15/2019, 11/08/2019  .  Pneumococcal Conjugate-13 01/25/2014  . Pneumococcal Polysaccharide-23 03/14/2015  . Td 03/17/2017  . Tdap 12/24/2006  . Zoster 05/26/2014    Past Medical History:  Diagnosis Date  . Cataract   . Colon polyps 02/09/2007   [facility]; colonoscopy.  Marland Kitchen COPD (chronic obstructive pulmonary disease) ([place])   . Diverticula of colon 02/09/2007  . Glucose intolerance (impaired glucose tolerance)   . Hypertension   . Internal hemorrhoids 02/09/2007.  Marland Kitchen Pneumonia 07/2018    Tobacco History: Social History   Tobacco Use  Smoking Status Former Smoker  . Packs/day: 1.00  . Years: 30.00  . Pack years: 30.00  . Types: Cigarettes  . Quit date: 10/23/2013  . Years since quitting: 6.2  Smokeless Tobacco Never Used   Counseling given: Not Answered   Outpatient Medications Prior to Visit  Medication Sig Dispense Refill  . albuterol (VENTOLIN HFA) 108 (90 Base) MCG/ACT inhaler Inhale 2 puffs into the lungs every 4 (four) hours as needed for wheezing or shortness of breath (cough, shortness of breath or wheezing.). 18 g 3  . famotidine (PEPCID) 20 MG tablet TAKE 1 TABLET BY MOUTH  TWICE DAILY AFTER BREAKFAST AND AFTER SUPPER 180 tablet 3  . fluticasone (FLONASE) 50 MCG/ACT nasal spray Place 2 sprays into both nostrils daily. 48 g 1  . hydrochlorothiazide (HYDRODIURIL) 25 MG tablet Take 1 tablet (25 mg total) by mouth daily. 90 tablet 3  . loratadine (CLARITIN) 10 MG tablet Take 1 tablet (10 mg total) by mouth daily. 30 tablet 11  .  Multiple Vitamins-Minerals (WOMENS 50+ MULTI VITAMIN/MIN) TABS Take by mouth daily.    . OXYGEN 2lpm with sleep and 3lpm with exertion AHC    . potassium chloride SA (KLOR-CON) 20 MEQ tablet Take 1 tablet (20 mEq total) by mouth daily. 90 tablet 3  . Tiotropium Bromide-Olodaterol (STIOLTO RESPIMAT) 2.5-2.5 MCG/ACT AERS Inhale 2 puffs into the lungs daily. 4 g 0  . Tiotropium Bromide-Olodaterol (STIOLTO RESPIMAT) 2.5-2.5 MCG/ACT AERS Inhale 2 puffs into the lungs daily. 12 g 3    Facility-Administered Medications Prior to Visit  Medication Dose Route Frequency Provider Last Rate Last Admin  . lidocaine-EPINEPHrine (XYLOCAINE-EPINEPHrine) 1 %-1:200000 (PF) injection 2 mL  2 mL Intradermal Once Forrest Moron, MD        Review of Systems  Review of Systems  HENT: Positive for congestion.   Respiratory: Negative for cough and shortness of breath.     Physical Exam  BP 138/74 (BP Location: Right Arm, Cuff Size: Normal)   Pulse (!) 102   Temp (!) 97.3 F (36.3 C) (Temporal)   Ht 5\' 5"  (1.651 m)   Wt 196 lb 9.6 oz (89.2 kg)   SpO2 97%   BMI 32.72 kg/m  Physical Exam Constitutional:      Appearance: Normal appearance.  HENT:     Head: Normocephalic and atraumatic.     Right Ear: Tympanic membrane normal. There is no impacted cerumen.     Left Ear: Tympanic membrane normal. There is no impacted cerumen.     Mouth/Throat:     Mouth: Mucous membranes are moist.     Pharynx: Oropharynx is clear.  Cardiovascular:     Rate and Rhythm: Normal rate and regular rhythm.  Pulmonary:     Effort: Pulmonary effort is normal.     Breath sounds: Normal breath sounds.  Musculoskeletal:     Cervical back: Normal range of motion and neck supple.     Left lower leg: Edema present.     Comments: Trace left ankle swelling  Neurological:     Mental Status: She is alert.  Psychiatric:        Mood and Affect: Mood normal.        Behavior: Behavior normal.        Thought Content: Thought content normal.        Judgment: Judgment normal.      Lab Results:  CBC    Component Value Date/Time   WBC 7.6 09/23/2019 0927   WBC 10.8 (A) 09/01/2018 1613   WBC 7.7 08/21/2018 0549   RBC 4.95 09/23/2019 0927   RBC 5.40 09/01/2018 1613   RBC 4.77 08/21/2018 0549   HGB 15.0 09/23/2019 0927   HCT 44.4 09/23/2019 0927   PLT 294 09/23/2019 0927   MCV 90 09/23/2019 0927   MCH 30.3 09/23/2019 0927   MCH 30.0 09/01/2018 1613   MCH 29.4 08/21/2018 0549   MCHC 33.8  09/23/2019 0927   MCHC 34.0 09/01/2018 1613   MCHC 31.7 08/21/2018 0549   RDW 13.1 09/23/2019 0927   LYMPHSABS 2.1 09/23/2019 0927   MONOABS 0.7 08/20/2018 1908   EOSABS 0.2 09/23/2019 0927   BASOSABS 0.1 09/23/2019 0927    BMET    Component Value Date/Time   NA 141 09/23/2019 0927   K 3.7 09/23/2019 0927   CL 99 09/23/2019 0927   CO2 24 09/23/2019 0927   GLUCOSE 88 09/23/2019 0927   GLUCOSE 151 (H) 08/21/2018 0549   BUN 10 09/23/2019 [id number]  CREATININE 0.95 09/23/2019 0927   CREATININE 0.88 06/17/2016 0821   CALCIUM 9.9 09/23/2019 0927   GFRNONAA 60 09/23/2019 0927   GFRNONAA 56 (L) 07/26/2014 0951   GFRAA 70 09/23/2019 0927   GFRAA 65 07/26/2014 0951    BNP    Component Value Date/Time   BNP 34.4 08/20/2018 1903    ProBNP No results found for: PROBNP  Imaging: No results found.   Assessment & Plan:   COPD GOLD I - Stable interval, no recent exacerbations. Rare SABA use. Reports morning congestion  - Continue Stiolto 2 puffs once daily; use prn albuterol 2 puffs every 4-6 hours as needed for breakthrough shortness of breath or wheezing  - Use flutter valve 2-3 times a day as needed for congestion - Continue to stay active with physical exercises at home  - Refer: Lung cancer screening program > 30 pack year hx quit in 2015  - FU in 6 months with Dr. Melvyn Novas  Chronic respiratory failure with hypoxia [facility]) - Continue 2- 3L continuous oxygen to keep O2 >88-90%   Martyn Ehrich, NP 01/09/2020

## 2020-01-09 NOTE — Patient Instructions (Addendum)
Refer: Lung cancer screening program  Orders: Flutter valve-use to 3 times daily as needed for congestion  COPD: Stiolto 2 puffs once daily in the morning  Follow-up: 6 months with Dr. Melvyn Novas or sooner if needed

## 2020-01-09 NOTE — Assessment & Plan Note (Signed)
-   Continue 2- 3L continuous oxygen to keep O2 >88-90%

## 2020-01-09 NOTE — Assessment & Plan Note (Addendum)
-   Stable interval, no recent exacerbations. Rare SABA use. Reports morning congestion  - Continue Stiolto 2 puffs once daily; use prn albuterol 2 puffs every 4-6 hours as needed for breakthrough shortness of breath or wheezing  - Use flutter valve 2-3 times a day as needed for congestion - Continue to stay active with physical exercises at home  - Refer: Lung cancer screening program > 30 pack year hx quit in 2015  - FU in 6 months with Dr. Melvyn Novas

## 2020-01-24 ENCOUNTER — Ambulatory Visit: Payer: Medicare Other | Admitting: Internal Medicine

## 2020-01-31 ENCOUNTER — Other Ambulatory Visit: Payer: Self-pay | Admitting: Internal Medicine

## 2020-03-12 ENCOUNTER — Other Ambulatory Visit: Payer: Self-pay | Admitting: Family Medicine

## 2020-03-12 DIAGNOSIS — Z1231 Encounter for screening mammogram for malignant neoplasm of breast: Secondary | ICD-10-CM

## 2020-03-20 ENCOUNTER — Encounter: Payer: Self-pay | Admitting: Registered Nurse

## 2020-03-20 ENCOUNTER — Ambulatory Visit (INDEPENDENT_AMBULATORY_CARE_PROVIDER_SITE_OTHER): Payer: Medicare Other | Admitting: Registered Nurse

## 2020-03-20 ENCOUNTER — Other Ambulatory Visit: Payer: Self-pay

## 2020-03-20 VITALS — BP 131/78 | HR 96 | Temp 98.2°F | Ht 65.0 in | Wt 192.6 lb

## 2020-03-20 DIAGNOSIS — J441 Chronic obstructive pulmonary disease with (acute) exacerbation: Secondary | ICD-10-CM | POA: Diagnosis not present

## 2020-03-20 DIAGNOSIS — R7303 Prediabetes: Secondary | ICD-10-CM

## 2020-03-20 DIAGNOSIS — I10 Essential (primary) hypertension: Secondary | ICD-10-CM | POA: Diagnosis not present

## 2020-03-20 MED ORDER — ALBUTEROL SULFATE HFA 108 (90 BASE) MCG/ACT IN AERS: 2.0000 | INHALATION_SPRAY | RESPIRATORY_TRACT | 3 refills | Status: DC | PRN

## 2020-03-20 MED ORDER — HYDROCHLOROTHIAZIDE 25 MG PO TABS: 25.0000 mg | ORAL_TABLET | Freq: Every day | ORAL | 3 refills | Status: DC
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
## 2020-03-20 NOTE — Patient Instructions (Signed)
° ° ° °  If you have lab work done today you will be contacted with your lab results within the next 2 weeks.  If you have not heard from us then please contact us. The fastest way to get your results is to register for My Chart. ° ° °IF you received an x-ray today, you will receive an invoice from [facility]. Please contact [facility] at [phone] with questions or concerns regarding your invoice.  ° °IF you received labwork today, you will receive an invoice from [facility]. Please contact [facility] at [phone] with questions or concerns regarding your invoice.  ° °Our billing staff will not be able to assist you with questions regarding bills from these companies. ° °You will be contacted with the lab results as soon as they are available. The fastest way to get your results is to activate your My Chart account. Instructions are located on the last page of this paperwork. If you have not heard from us regarding the results in 2 weeks, please contact this office. °  ° ° ° °

## 2020-03-21 LAB — CMP14+EGFR
ALT: 16 IU/L (ref 0–32)
AST: 21 IU/L (ref 0–40)
Albumin/Globulin Ratio: 1.4 (ref 1.2–2.2)
Albumin: 4.4 g/dL (ref 3.7–4.7)
Alkaline Phosphatase: 99 IU/L (ref 48–121)
BUN/Creatinine Ratio: 11 — ABNORMAL LOW (ref 12–28)
BUN: 10 mg/dL (ref 8–27)
Bilirubin Total: 0.5 mg/dL (ref 0.0–1.2)
CO2: 28 mmol/L (ref 20–29)
Calcium: 9.7 mg/dL (ref 8.7–10.3)
Chloride: 99 mmol/L (ref 96–106)
Creatinine, Ser: 0.94 mg/dL (ref 0.57–1.00)
GFR calc Af Amer: 71 mL/min/{1.73_m2} (ref >59)
GFR calc non Af Amer: 61 mL/min/{1.73_m2} (ref >59)
Globulin, Total: 3.2 g/dL (ref 1.5–4.5)
Glucose: 97 mg/dL (ref 65–99)
Potassium: 3.6 mmol/L (ref 3.5–5.2)
Sodium: 140 mmol/L (ref 134–144)
Total Protein: 7.6 g/dL (ref 6.0–8.5)

## 2020-03-21 LAB — HEMOGLOBIN A1C
Est. average glucose Bld gHb Est-mCnc: 137 mg/dL
Hgb A1c MFr Bld: 6.4 % — ABNORMAL HIGH (ref 4.8–5.6)

## 2020-03-21 LAB — CBC
Hematocrit: 45.9 % (ref 34.0–46.6)
Hemoglobin: 15.1 g/dL (ref 11.1–15.9)
MCH: 29.6 pg (ref 26.6–33.0)
MCHC: 32.9 g/dL (ref 31.5–35.7)
MCV: 90 fL (ref 79–97)
Platelets: 246 10*3/uL (ref 150–450)
RBC: 5.1 x10E6/uL (ref 3.77–5.28)
RDW: 13.1 % (ref 11.7–15.4)
WBC: 6.7 10*3/uL (ref 3.4–10.8)

## 2020-03-23 ENCOUNTER — Ambulatory Visit: Payer: Medicare Other | Admitting: Family Medicine

## 2020-03-23 ENCOUNTER — Other Ambulatory Visit: Payer: Self-pay

## 2020-03-23 DIAGNOSIS — J441 Chronic obstructive pulmonary disease with (acute) exacerbation: Secondary | ICD-10-CM

## 2020-03-23 MED ORDER — ALBUTEROL SULFATE HFA 108 (90 BASE) MCG/ACT IN AERS: 2.0000 | INHALATION_SPRAY | RESPIRATORY_TRACT | 3 refills | Status: DC | PRN

## 2020-03-29 ENCOUNTER — Encounter: Payer: Self-pay | Admitting: Registered Nurse

## 2020-03-30 ENCOUNTER — Telehealth: Payer: Self-pay | Admitting: Registered Nurse

## 2020-03-30 NOTE — Telephone Encounter (Signed)
Called pt back informed her those notes are more to remind [name] and she can absolutely wait till next OV, also let her know we are still unsure the current COVID 19 vaccine plan, no word about boosters yet

## 2020-03-30 NOTE — Telephone Encounter (Signed)
Pt is in mychart and under her plan of care . It states August the 1st she was due for flu shot / And urine protien as of August the 3rd   Patient is wondering if she can wait ti her next physical ? Also will she need a booster for Covid

## 2020-05-02 ENCOUNTER — Other Ambulatory Visit: Payer: Self-pay

## 2020-05-02 ENCOUNTER — Ambulatory Visit
Admission: RE | Admit: 2020-05-02 | Discharge: 2020-05-02 | Disposition: A | Discharge status: Home / Self Care | Payer: Medicare Other | Source: Ambulatory Visit | Attending: Family Medicine | Admitting: Family Medicine

## 2020-05-02 DIAGNOSIS — Z1231 Encounter for screening mammogram for malignant neoplasm of breast: Secondary | ICD-10-CM | POA: Diagnosis not present

## 2020-05-22 ENCOUNTER — Telehealth: Payer: Self-pay

## 2020-05-22 NOTE — Telephone Encounter (Signed)
Patient contacted the office about scheduling a new patient appointment. Would like to relocate practices on location.

## 2020-05-24 DIAGNOSIS — Z23 Encounter for immunization: Secondary | ICD-10-CM | POA: Diagnosis not present

## 2020-06-21 DIAGNOSIS — E119 Type 2 diabetes mellitus without complications: Secondary | ICD-10-CM | POA: Diagnosis not present

## 2020-06-21 DIAGNOSIS — Z961 Presence of intraocular lens: Secondary | ICD-10-CM | POA: Diagnosis not present

## 2020-06-21 DIAGNOSIS — H04123 Dry eye syndrome of bilateral lacrimal glands: Secondary | ICD-10-CM | POA: Diagnosis not present

## 2020-06-21 DIAGNOSIS — H26492 Other secondary cataract, left eye: Secondary | ICD-10-CM | POA: Diagnosis not present

## 2020-06-21 DIAGNOSIS — H35373 Puckering of macula, bilateral: Secondary | ICD-10-CM | POA: Diagnosis not present

## 2020-07-10 NOTE — Progress Notes (Signed)
@Patient  ID: Nicole Spence, female    DOB: 1945/11/29, 74 y.o.   MRN: [id number]  Chief Complaint  Patient presents with  . Follow-up    Pt states she has been doing okay since last visit. States breathing is about the same. Pt does have complaints of postnasal drainage due to weather change and also has been coughing a little coughing up brown-red phlegm in the morning.    Referring provider: Forrest Moron, MD  Brief patient profile:  Quit smoking 2015 noted onset of doe age [age] with sense of nasal drainage/ sore throat and eventually placed on spiriva then added symbicort but actually much better since started reflux meds early April 2019 and referred to pulmonary clinic 12/18/2017 by Dr   Reginia Forts p ent eval around 2017 pos gerd but had stopped PPI due to reported potential side effects in media .  HPI: 74 year old, former smoker quit in 2015 (30-pack-year history).  Past medical history significant for COPD Gold I, chronic respiratory failure with hypoxia, seasonal allergic rhinitis, community-acquired pneumonia, essential hypertension, obesity.  Patient of Dr. Melvyn Novas. Maintained on Stiolto Respimat.   Previous [facility] pulmonary encounter: 01/09/2020 Patient presents today for 6 months follow-up. States that her breathing is baseline. She is compliant with stiolto using it once daily in the morning. Rare SABA use. She wakes up with some morning congestion. She is on 2L continuous, uses 3L on exertion. She remains active with cycling and stretching. She uses 4L when exercising.  She is up to date with COVID vaccine.   07/11/2020- interim hx Patient presents today for 6 month follow-up. She is doing pretty well. Breathing overall all is ok except for when humidity is high. She is compliant with Stiolto, she is currently in the donut hole. Sometimes she will take 1 puff to make her medication last longer. She uses 2L oxygen at night and 3L pulsed when she exerts herself. Current has a  productive cough with associated sinus congestion. She is coughing up brown to red mucus. She thinks its related to her sinuses because she has been having some morning nose bleeds. She has not needed to use her albuterol rescue inhaler. She is up to date with Covid and Influenza vaccines.    Pulmonary function testing: PFT's  01/29/2018  FEV1 1.81  (87 % ) ratio 67  p 16 % improvement from saba p nothing prior to study with DLCO  32 % corrects to 46  % for alv volume    Allergies  Allergen Reactions  . Adhesive [Tape] Other (See Comments)    IRRITATES SKIN     Immunization History  Administered Date(s) Administered  . Fluad Quad(high Dose 65+) 05/11/2019  . Influenza, High Dose Seasonal PF 09/20/2018, 05/24/2020  . Influenza,inj,Quad PF,6+ Mos 05/04/2014, 09/21/2015, 06/17/2016  . Influenza-Unspecified 04/26/2019  . PFIZER SARS-COV-2 Vaccination 10/15/2019, 11/08/2019, 05/24/2020  . Pneumococcal Conjugate-13 01/25/2014  . Pneumococcal Polysaccharide-23 03/14/2015  . Td 03/17/2017  . Tdap 12/24/2006  . Zoster 05/26/2014    Past Medical History:  Diagnosis Date  . Cataract   . Colon polyps 02/09/2007   [facility]; colonoscopy.  Marland Kitchen COPD (chronic obstructive pulmonary disease) ([place])   . Diverticula of colon 02/09/2007  . Glucose intolerance (impaired glucose tolerance)   . Hypertension   . Internal hemorrhoids 02/09/2007.  Marland Kitchen Pneumonia 07/2018    Tobacco History: Social History   Tobacco Use  Smoking Status Former Smoker  . Packs/day: 1.00  . Years: 30.00  .  Pack years: 30.00  . Types: Cigarettes  . Quit date: 10/23/2013  . Years since quitting: 6.7  Smokeless Tobacco Never Used   Counseling given: Not Answered   Outpatient Medications Prior to Visit  Medication Sig Dispense Refill  . famotidine (PEPCID) 20 MG tablet TAKE 1 TABLET BY MOUTH  TWICE DAILY AFTER BREAKFAST AND AFTER SUPPER 180 tablet 3  . fluticasone (FLONASE) 50 MCG/ACT nasal spray Place 2 sprays into both  nostrils daily. 48 g 1  . hydrochlorothiazide (HYDRODIURIL) 25 MG tablet Take 1 tablet (25 mg total) by mouth daily. 90 tablet 3  . loratadine (CLARITIN) 10 MG tablet Take 1 tablet (10 mg total) by mouth daily. 30 tablet 11  . Multiple Vitamins-Minerals (WOMENS 50+ MULTI VITAMIN/MIN) TABS Take by mouth daily.    . OXYGEN 2lpm with sleep and 3lpm with exertion AHC    . potassium chloride SA (KLOR-CON) 20 MEQ tablet Take 1 tablet (20 mEq total) by mouth daily. 90 tablet 3  . Respiratory Therapy Supplies (FLUTTER) DEVI Use 3 times a day as needed for congestion. 1 each 0  . STIOLTO RESPIMAT 2.5-2.5 MCG/ACT AERS USE 2 INHALATIONS BY MOUTH  DAILY 12 g 3  . albuterol (VENTOLIN HFA) 108 (90 Base) MCG/ACT inhaler Inhale 2 puffs into the lungs every 4 (four) hours as needed for wheezing or shortness of breath (cough, shortness of breath or wheezing.). 18 g 3   Facility-Administered Medications Prior to Visit  Medication Dose Route Frequency Provider Last Rate Last Admin  . lidocaine-EPINEPHrine (XYLOCAINE-EPINEPHrine) 1 %-1:200000 (PF) injection 2 mL  2 mL Intradermal Once Forrest Moron, MD       Review of Systems  Review of Systems  Constitutional: Negative.   HENT: Positive for congestion.        Nose bleeds  Respiratory: Positive for cough. Negative for shortness of breath and wheezing.    Physical Exam  BP 122/70 (BP Location: Left Arm, Cuff Size: Normal)   Pulse (!) 110   Ht 5\' 5"  (1.651 m)   Wt 187 lb 3.2 oz (84.9 kg)   SpO2 97%   BMI 31.15 kg/m  Physical Exam Constitutional:      Appearance: Normal appearance.  HENT:     Head: Normocephalic and atraumatic.     Mouth/Throat:     Comments: Deferred d/t masking Cardiovascular:     Rate and Rhythm: Normal rate and regular rhythm.  Pulmonary:     Effort: Pulmonary effort is normal.     Breath sounds: Rales present. No wheezing or rhonchi.     Comments: O2  Musculoskeletal:        General: Normal range of motion.      Cervical back: Normal range of motion.  Skin:    General: Skin is warm and dry.  Neurological:     General: No focal deficit present.     Mental Status: She is alert and oriented to person, place, and time. Mental status is at baseline.  Psychiatric:        Mood and Affect: Mood normal.        Behavior: Behavior normal.        Thought Content: Thought content normal.        Judgment: Judgment normal.      Lab Results:  CBC    Component Value Date/Time   WBC 6.7 03/20/2020 0905   WBC 10.8 (A) 09/01/2018 1613   WBC 7.7 08/21/2018 0549   RBC 5.10 03/20/2020 0905  RBC 5.40 09/01/2018 1613   RBC 4.77 08/21/2018 0549   HGB 15.1 03/20/2020 0905   HCT 45.9 03/20/2020 0905   PLT 246 03/20/2020 0905   MCV 90 03/20/2020 0905   MCH 29.6 03/20/2020 0905   MCH 30.0 09/01/2018 1613   MCH 29.4 08/21/2018 0549   MCHC 32.9 03/20/2020 0905   MCHC 34.0 09/01/2018 1613   MCHC 31.7 08/21/2018 0549   RDW 13.1 03/20/2020 0905   LYMPHSABS 2.1 09/23/2019 0927   MONOABS 0.7 08/20/2018 1908   EOSABS 0.2 09/23/2019 0927   BASOSABS 0.1 09/23/2019 0927    BMET    Component Value Date/Time   NA 140 03/20/2020 0905   K 3.6 03/20/2020 0905   CL 99 03/20/2020 0905   CO2 28 03/20/2020 0905   GLUCOSE 97 03/20/2020 0905   GLUCOSE 151 (H) 08/21/2018 0549   BUN 10 03/20/2020 0905   CREATININE 0.94 03/20/2020 0905   CREATININE 0.88 06/17/2016 0821   CALCIUM 9.7 03/20/2020 0905   GFRNONAA 61 03/20/2020 0905   GFRNONAA 56 (L) 07/26/2014 0951   GFRAA 71 03/20/2020 0905   GFRAA 65 07/26/2014 0951    BNP    Component Value Date/Time   BNP 34.4 08/20/2018 1903    ProBNP No results found for: PROBNP  Imaging: DG Chest 2 View  Result Date: 07/11/2020 CLINICAL DATA:  History COPD exacerbation EXAM: CHEST - 2 VIEW COMPARISON:  Radiograph 10/20/2018 FINDINGS: Normal mediastinum and cardiac silhouette. Chronic central bronchitic markings. Normal pulmonary vasculature. No effusion, infiltrate,  or pneumothorax. IMPRESSION: Chronic bronchitic changes.  No acute cardiopulmonary process. Electronically Signed   By: Suzy Bouchard M.D.   On: 07/11/2020 09:39     Assessment & Plan:   COPD GOLD I - Stable interval; No recent exacerbations. She has a productive cough likely d/t allergic rhinitis. No increased shortness of breath or wheezing. Checking CXR today to ensure no underlying pneumonia. Continue Stiolto 2 puffs once daily. She is currently in the donut hole, recommend patient call insurance and ask what LABA/LAMA is formulary on plan. Follow-up in 6 months.   Other seasonal allergic rhinitis - Continue Claritin and Saline nasal rinses   Chronic respiratory failure with hypoxia (HCC) - Continues to benefit from supplemental oxygen; using 3L pulsed O2 on exertion and 2L at night    Martyn Ehrich, NP 07/11/2020

## 2020-07-11 ENCOUNTER — Ambulatory Visit (INDEPENDENT_AMBULATORY_CARE_PROVIDER_SITE_OTHER): Payer: Medicare Other | Admitting: Primary Care

## 2020-07-11 ENCOUNTER — Encounter: Payer: Self-pay | Admitting: Primary Care

## 2020-07-11 ENCOUNTER — Ambulatory Visit (INDEPENDENT_AMBULATORY_CARE_PROVIDER_SITE_OTHER): Payer: Medicare Other

## 2020-07-11 ENCOUNTER — Other Ambulatory Visit: Payer: Self-pay

## 2020-07-11 ENCOUNTER — Ambulatory Visit: Payer: Medicare Other | Admitting: Internal Medicine

## 2020-07-11 VITALS — BP 122/70 | HR 110 | Ht 65.0 in | Wt 187.2 lb

## 2020-07-11 DIAGNOSIS — J441 Chronic obstructive pulmonary disease with (acute) exacerbation: Secondary | ICD-10-CM | POA: Diagnosis not present

## 2020-07-11 DIAGNOSIS — J302 Other seasonal allergic rhinitis: Secondary | ICD-10-CM | POA: Diagnosis not present

## 2020-07-11 DIAGNOSIS — J449 Chronic obstructive pulmonary disease, unspecified: Secondary | ICD-10-CM | POA: Diagnosis not present

## 2020-07-11 DIAGNOSIS — J9611 Chronic respiratory failure with hypoxia: Secondary | ICD-10-CM

## 2020-07-11 MED ORDER — ALBUTEROL SULFATE HFA 108 (90 BASE) MCG/ACT IN AERS: 2.0000 | INHALATION_SPRAY | RESPIRATORY_TRACT | 3 refills | Status: DC | PRN

## 2020-07-11 NOTE — Assessment & Plan Note (Addendum)
-   Continues to benefit from supplemental oxygen; using 3L pulsed O2 on exertion and 2L at night

## 2020-07-11 NOTE — Assessment & Plan Note (Addendum)
-   Continue Claritin and Saline nasal rinses

## 2020-07-11 NOTE — Progress Notes (Signed)
I attempted to call both patients phone numbers listed but no answer. Please let her know CXR looked fine, it showed chronic bronchitic changes. No acute pneumonia. Recommend mucinex twice daily. If her cough does not improve in a week call us and we can send her something in

## 2020-07-11 NOTE — Patient Instructions (Addendum)
Recommendations: Continue Claritin and nasal rinses  Continue Stiolto 2 puffs once daily  Call insurance and ask what LABA/LAMA is formulary on plan OR alternative to Stiolto   Orders: CXR today   Rx: Refilled albuterol (sent to [facility])  Follow-up: 6 months with Dr. [name] (bring all medication with you)   Chronic Obstructive Pulmonary Disease Chronic obstructive pulmonary disease (COPD) is a long-term (chronic) lung problem. When you have COPD, it is hard for air to get in and out of your lungs. Usually the condition gets worse over time, and your lungs will never return to normal. There are things you can do to keep yourself as healthy as possible.  Your doctor may treat your condition with: ? Medicines. ? Oxygen. ? Lung surgery.  Your doctor may also recommend: ? Rehabilitation. This includes steps to make your body work better. It may involve a team of specialists. ? Quitting smoking, if you smoke. ? Exercise and changes to your diet. ? Comfort measures (palliative care). Follow these instructions at home: Medicines  Take over-the-counter and prescription medicines only as told by your doctor.  Talk to your doctor before taking any cough or allergy medicines. You may need to avoid medicines that cause your lungs to be dry. Lifestyle  If you smoke, stop. Smoking makes the problem worse. If you need help quitting, ask your doctor.  Avoid being around things that make your breathing worse. This may include smoke, chemicals, and fumes.  Stay active, but remember to rest as well.  Learn and use tips on how to relax.  Make sure you get enough sleep. Most adults need at least 7 hours of sleep every night.  Eat healthy foods. Eat smaller meals more often. Rest before meals. Controlled breathing Learn and use tips on how to control your breathing as told by your doctor. Try:  Breathing in (inhaling) through your nose for 1 second. Then, pucker your lips and breath out  (exhale) through your lips for 2 seconds.  Putting one hand on your belly (abdomen). Breathe in slowly through your nose for 1 second. Your hand on your belly should move out. Pucker your lips and breathe out slowly through your lips. Your hand on your belly should move in as you breathe out.  Controlled coughing Learn and use controlled coughing to clear mucus from your lungs. Follow these steps: 1. Lean your head a little forward. 2. Breathe in deeply. 3. Try to hold your breath for 3 seconds. 4. Keep your mouth slightly open while coughing 2 times. 5. Spit any mucus out into a tissue. 6. Rest and do the steps again 1 or 2 times as needed. General instructions  Make sure you get all the shots (vaccines) that your doctor recommends. Ask your doctor about a flu shot and a pneumonia shot.  Use oxygen therapy and pulmonary rehabilitation if told by your doctor. If you need home oxygen therapy, ask your doctor if you should buy a tool to measure your oxygen level (oximeter).  Make a COPD action plan with your doctor. This helps you to know what to do if you feel worse than usual.  Manage any other conditions you have as told by your doctor.  Avoid going outside when it is very hot, cold, or humid.  Avoid people who have a sickness you can catch (contagious).  Keep all follow-up visits as told by your doctor. This is important. Contact a doctor if:  You cough up more mucus than usual.  There is a change in the color or thickness of the mucus.  It is harder to breathe than usual.  Your breathing is faster than usual.  You have trouble sleeping.  You need to use your medicines more often than usual.  You have trouble doing your normal activities such as getting dressed or walking around the house. Get help right away if:  You have shortness of breath while resting.  You have shortness of breath that stops you from: ? Being able to talk. ? Doing normal activities.  Your  chest hurts for longer than 5 minutes.  Your skin color is more blue than usual.  Your pulse oximeter shows that you have low oxygen for longer than 5 minutes.  You have a fever.  You feel too tired to breathe normally. Summary  Chronic obstructive pulmonary disease (COPD) is a long-term lung problem.  The way your lungs work will never return to normal. Usually the condition gets worse over time. There are things you can do to keep yourself as healthy as possible.  Take over-the-counter and prescription medicines only as told by your doctor.  If you smoke, stop. Smoking makes the problem worse. This information is not intended to replace advice given to you by your health care provider. Make sure you discuss any questions you have with your health care provider. Document Revised: 07/24/2017 Document Reviewed: 09/15/2016 Elsevier Patient Education  2020 [employer].

## 2020-07-11 NOTE — Assessment & Plan Note (Addendum)
-   Stable interval; No recent exacerbations. She has a productive cough likely d/t allergic rhinitis. No increased shortness of breath or wheezing. CXR today showed chronic bronchitis, no acute process. Continue Stiolto 2 puffs once daily. She is currently in the donut hole, recommend patient call insurance and ask what LABA/LAMA is formulary on plan. Follow-up in 6 months.

## 2020-07-12 ENCOUNTER — Other Ambulatory Visit: Payer: Self-pay | Admitting: *Deleted

## 2020-07-12 DIAGNOSIS — J441 Chronic obstructive pulmonary disease with (acute) exacerbation: Secondary | ICD-10-CM

## 2020-07-12 MED ORDER — PROAIR HFA 108 (90 BASE) MCG/ACT IN AERS: 2.0000 | INHALATION_SPRAY | RESPIRATORY_TRACT | 3 refills | Status: AC | PRN

## 2020-07-13 ENCOUNTER — Telehealth: Payer: Self-pay | Admitting: Internal Medicine

## 2020-07-13 MED ORDER — STIOLTO RESPIMAT 2.5-2.5 MCG/ACT IN AERS: 2.0000 | INHALATION_SPRAY | Freq: Every day | RESPIRATORY_TRACT | 0 refills | Status: DC

## 2020-07-13 NOTE — Progress Notes (Signed)
Called and spoke with patient, advised of results and recommendations per Geraldo Pitter NP.  Nothing further needed.

## 2020-07-13 NOTE — Telephone Encounter (Signed)
See results note.  Patient asked about a sample of Stiolto, advised we had one left and I placed it in a bag with her name on it, she will come in on Monday to pick it up.  Nothing further needed.

## 2020-08-22 ENCOUNTER — Telehealth: Payer: Self-pay | Admitting: Internal Medicine

## 2020-08-22 MED ORDER — FAMOTIDINE 20 MG PO TABS: ORAL_TABLET | ORAL | 3 refills | Status: DC

## 2020-08-22 MED ORDER — STIOLTO RESPIMAT 2.5-2.5 MCG/ACT IN AERS: 2.0000 | INHALATION_SPRAY | Freq: Every day | RESPIRATORY_TRACT | 0 refills | Status: DC

## 2020-08-22 NOTE — Telephone Encounter (Signed)
Spoke with the pt  I have refilled her pepcid  She states does not have the income to purchase her Stiolto until after the begging of 2022  I gave her 2 samples to last her until she can get her rx  Nothing further needed

## 2020-09-25 ENCOUNTER — Encounter: Payer: Medicare Other | Admitting: Family Medicine

## 2020-10-03 ENCOUNTER — Ambulatory Visit (INDEPENDENT_AMBULATORY_CARE_PROVIDER_SITE_OTHER): Payer: Medicare Other | Admitting: Internal Medicine

## 2020-10-03 ENCOUNTER — Encounter: Payer: Self-pay | Admitting: Internal Medicine

## 2020-10-03 ENCOUNTER — Other Ambulatory Visit: Payer: Self-pay

## 2020-10-03 VITALS — BP 114/73 | HR 93 | Temp 98.3°F | Resp 18 | Ht 65.0 in | Wt 184.0 lb

## 2020-10-03 DIAGNOSIS — R5383 Other fatigue: Secondary | ICD-10-CM

## 2020-10-03 DIAGNOSIS — E538 Deficiency of other specified B group vitamins: Secondary | ICD-10-CM

## 2020-10-03 DIAGNOSIS — R2991 Unspecified symptoms and signs involving the musculoskeletal system: Secondary | ICD-10-CM

## 2020-10-03 DIAGNOSIS — Z7689 Persons encountering health services in other specified circumstances: Secondary | ICD-10-CM | POA: Diagnosis not present

## 2020-10-03 DIAGNOSIS — Z8639 Personal history of other endocrine, nutritional and metabolic disease: Secondary | ICD-10-CM | POA: Diagnosis not present

## 2020-10-03 DIAGNOSIS — I10 Essential (primary) hypertension: Secondary | ICD-10-CM | POA: Diagnosis not present

## 2020-10-03 DIAGNOSIS — R0981 Nasal congestion: Secondary | ICD-10-CM

## 2020-10-03 DIAGNOSIS — E559 Vitamin D deficiency, unspecified: Secondary | ICD-10-CM

## 2020-10-03 DIAGNOSIS — R21 Rash and other nonspecific skin eruption: Secondary | ICD-10-CM

## 2020-10-03 DIAGNOSIS — Z0000 Encounter for general adult medical examination without abnormal findings: Secondary | ICD-10-CM

## 2020-10-03 MED ORDER — TRIAMCINOLONE ACETONIDE 0.5 % EX OINT: 1.0000 "application " | TOPICAL_OINTMENT | Freq: Two times a day (BID) | CUTANEOUS | 1 refills | Status: DC

## 2020-10-03 MED ORDER — FLUTICASONE PROPIONATE 50 MCG/ACT NA SUSP: 2.0000 | Freq: Every day | NASAL | 5 refills | Status: DC

## 2020-10-03 NOTE — Progress Notes (Signed)
Subjective:    Nicole Spence - 75 y.o. female MRN [id number]  Date of birth: [birth year]  HPI  Nicole Spence is to establish care. Patient has a PMH significant for COPD followed by pulm and on O2 therapy at night and with activity prn. Also has a PMH of HTN on HCTZ.   Thinks she broke out from a plant---she pulled them up to throw them away at the end of fall in [place]. She has an itchy rash on sides of her neck. Has tried Cocoa Butter and Hydrocortisone cream to the area.    MMSE - Mini Mental State Exam 10/03/2020  Orientation to time 5  Orientation to Place 5  Registration 3  Attention/ Calculation 5  Recall 3  Language- name 2 objects 2  Language- repeat 1  Language- follow 3 step command 3  Language- read & follow direction 1  Write a sentence 1  Copy design 1  Total score 30    Fall Risk  10/03/2020 10/03/2020 03/20/2020 09/23/2019 03/21/2019  Falls in the past year? 0 0 0 0 0  Comment - - - - -  Number falls in past yr: 0 0 0 - 0  Injury with Fall? 0 0 0 0 0  Comment - - - - -  Risk for fall due to : - - - - -  Risk for fall due to: Comment - - - - -  Follow up - - Falls evaluation completed Falls evaluation completed;Education provided Falls evaluation completed    Depression screen [facility] 2/9 10/03/2020 10/03/2020 03/20/2020  Decreased Interest 2 2 0  Down, Depressed, Hopeless 0 0 0  PHQ - 2 Score 2 2 0  Altered sleeping 0 0 -  Tired, decreased energy 3 3 -  Change in appetite 0 0 -  Feeling bad or failure about yourself  0 0 -  Trouble concentrating 0 0 -  Moving slowly or fidgety/restless 0 0 -  Suicidal thoughts 0 0 -  PHQ-9 Score 5 5 -  Difficult doing work/chores Not difficult at all Somewhat difficult -  Some recent data might be hidden    ROS per HPI    Health Maintenance:  Health Maintenance Due  Topic Date Due  . URINE MICROALBUMIN  03/27/2020     Past Medical History: Patient Active Problem List   Diagnosis Date Noted  . Hypoxia 09/01/2018  .  Muscle cramps 09/01/2018  . CAP (community acquired pneumonia) 08/20/2018  . Hypokalemia 08/20/2018  . Chronic respiratory failure with hypoxia ([place]) 08/02/2018  . Exercise hypoxemia 02/03/2018  . Upper airway cough syndrome 12/20/2017  . Other seasonal allergic rhinitis 10/26/2014  . Physical deconditioning 10/26/2014  . Glucose intolerance (impaired glucose tolerance) 10/25/2014  . Obesity (BMI 30.0-34.9) 10/25/2014  . Essential hypertension 07/26/2014  . COPD GOLD I 07/26/2014      Social History   reports that she quit smoking about 6 years ago. Her smoking use included cigarettes. She has a 30.00 pack-year smoking history. She has never used smokeless tobacco. She reports current alcohol use of about 4.0 standard drinks of alcohol per week. She reports that she does not use drugs.   Family History  family history includes Breast cancer in her maternal grandfather; Cancer in her mother; Diabetes in her mother; Heart disease (age of onset: 46) in her father; Heart disease (age of onset: 76) in her mother; Hypertension in her father and mother; Stroke (age of onset: 41) in her father.   Medications:  reviewed and updated   Objective:   Physical Exam BP 114/73 (BP Location: Right Arm, Patient Position: Sitting, Cuff Size: Large)   Pulse 93   Temp 98.3 F (36.8 C) (Oral)   Resp 18   Ht 5\' 5"  (1.651 m)   Wt 184 lb (83.5 kg)   SpO2 91%   BMI 30.62 kg/m  Physical Exam Constitutional:      Appearance: She is not diaphoretic.  HENT:     Head: Normocephalic and atraumatic.     Mouth/Throat:     Mouth: Oropharynx is clear and moist.      Comments: TMs normal bilaterally Eyes:     Extraocular Movements: EOM normal.     Conjunctiva/sclera: Conjunctivae normal.     Pupils: Pupils are equal, round, and reactive to light.  Neck:     Thyroid: No thyromegaly.  Cardiovascular:     Rate and Rhythm: Normal rate and regular rhythm.     Pulses: Intact distal pulses.     Heart sounds:  Normal heart sounds. No murmur heard.   Pulmonary:     Effort: Pulmonary effort is normal. No respiratory distress.     Breath sounds: Normal breath sounds. No wheezing.  Abdominal:     General: Bowel sounds are normal. There is no distension.     Palpations: Abdomen is soft.     Tenderness: There is no abdominal tenderness. There is no guarding or rebound.  Musculoskeletal:        General: No deformity or edema. Normal range of motion.     Cervical back: Normal range of motion and neck supple.  Lymphadenopathy:     Cervical: No cervical adenopathy.  Skin:    General: Skin is warm and dry.     Findings: No rash.     Comments: Hyperpigmented flat skin lesions present in linear fashion on the sides of the neck, sparing anterior and posterior aspect.   Neurological:     Mental Status: She is alert and oriented to person, place, and time.     Gait: Gait is intact.  Psychiatric:        Mood and Affect: Mood and affect normal.        Judgment: Judgment normal.         Assessment & Plan:   1. Encounter to establish care Reviewed patient's PMH, social history, surgical history, and medications.   2. Medicare annual wellness visit, initial Counseled on 150 minutes of exercise per week, healthy eating (including decreased daily intake of saturated fats, cholesterol, added sugars, sodium), STI prevention, routine healthcare maintenance.  3. Essential hypertension BP at goal. Continue HCTZ. Has BMET within the past year that was unremarkable.   4. Sinus congestion - fluticasone (FLONASE) 50 MCG/ACT nasal spray; Place 2 sprays into both nostrils daily.  Dispense: 16 g; Refill: 5  5. Fatigue, unspecified type - CBC - TSH  6. Vitamin D deficiency - VITAMIN D 25 Hydroxy (Vit-D Deficiency, Fractures)  7. Vitamin B12 deficiency - Vitamin B12  8. History of diabetes mellitus Last A1c well controlled with result of 6.4% in July 2021. Will obtain A1c to monitor.  - Hemoglobin A1c -  Microalbumin / creatinine urine ratio  9. Rash and nonspecific skin eruption Given location and history of DM, potentially acanthosis nigricans, although does not appear classically like this. Will obtain A1c. Trial of more potent steroid given history of pruritic rash following exposure to plant. If no improvement, recommended derm referral.  - Hemoglobin A1c;  Future - triamcinolone ointment (KENALOG) 0.5 %; Apply 1 application topically 2 (two) times daily.  Dispense: 30 g; Refill: 1 - Hemoglobin A1c  10. Abnormal finding of foot Patient with prominence over the superior aspect of left foot. Asks for podiatry referral as previous provider was supposed to do this and this evidentially never happened.  - Ambulatory referral to [place], D.O. 10/03/2020, 9:21 AM Primary Care at [facility]

## 2020-10-04 LAB — MICROALBUMIN / CREATININE URINE RATIO
Creatinine, Urine: 273.3 mg/dL
Microalb/Creat Ratio: 6 mg/g creat (ref 0–29)
Microalbumin, Urine: 15.8 ug/mL

## 2020-10-04 LAB — TSH: TSH: 1.37 u[IU]/mL (ref 0.450–4.500)

## 2020-10-04 LAB — HEMOGLOBIN A1C
Est. average glucose Bld gHb Est-mCnc: 134 mg/dL
Hgb A1c MFr Bld: 6.3 % — ABNORMAL HIGH (ref 4.8–5.6)

## 2020-10-04 LAB — VITAMIN B12: Vitamin B-12: 1213 pg/mL (ref 232–1245)

## 2020-10-04 LAB — VITAMIN D 25 HYDROXY (VIT D DEFICIENCY, FRACTURES): Vit D, 25-Hydroxy: 51.7 ng/mL (ref 30.0–100.0)

## 2020-10-05 LAB — CBC
Hematocrit: 46.1 % (ref 34.0–46.6)
Hemoglobin: 15.1 g/dL (ref 11.1–15.9)
MCH: 30.4 pg (ref 26.6–33.0)
MCHC: 32.8 g/dL (ref 31.5–35.7)
MCV: 93 fL (ref 79–97)
Platelets: 265 10*3/uL (ref 150–450)
RBC: 4.96 x10E6/uL (ref 3.77–5.28)
RDW: 13.3 % (ref 11.7–15.4)
WBC: 9.2 10*3/uL (ref 3.4–10.8)

## 2020-10-05 LAB — SPECIMEN STATUS REPORT

## 2020-10-09 ENCOUNTER — Ambulatory Visit (INDEPENDENT_AMBULATORY_CARE_PROVIDER_SITE_OTHER): Payer: Medicare Other

## 2020-10-09 ENCOUNTER — Encounter: Payer: Self-pay | Admitting: Podiatry

## 2020-10-09 ENCOUNTER — Ambulatory Visit (INDEPENDENT_AMBULATORY_CARE_PROVIDER_SITE_OTHER): Payer: Medicare Other | Admitting: Podiatry

## 2020-10-09 ENCOUNTER — Other Ambulatory Visit: Payer: Self-pay

## 2020-10-09 ENCOUNTER — Other Ambulatory Visit: Payer: Self-pay | Admitting: Podiatry

## 2020-10-09 DIAGNOSIS — D1724 Benign lipomatous neoplasm of skin and subcutaneous tissue of left leg: Secondary | ICD-10-CM

## 2020-10-09 DIAGNOSIS — R141 Gas pain: Secondary | ICD-10-CM | POA: Insufficient documentation

## 2020-10-09 DIAGNOSIS — Z86010 Personal history of colonic polyps: Secondary | ICD-10-CM | POA: Insufficient documentation

## 2020-10-09 DIAGNOSIS — M67472 Ganglion, left ankle and foot: Secondary | ICD-10-CM

## 2020-10-09 DIAGNOSIS — K219 Gastro-esophageal reflux disease without esophagitis: Secondary | ICD-10-CM | POA: Insufficient documentation

## 2020-10-09 DIAGNOSIS — R143 Flatulence: Secondary | ICD-10-CM | POA: Insufficient documentation
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
## 2020-12-03 ENCOUNTER — Telehealth: Payer: Self-pay | Admitting: Internal Medicine

## 2020-12-03 NOTE — Telephone Encounter (Signed)
Pt states PCP prescribed a cream for her neck. It is not itchy anymore but it is not going away. It's not fully gone. Pt states it's Not working. Pt wants to know if PCP can prescribe an alternate and if so it can be sent to the following pharmacy or if a referral for dermatology can be sent.   triamcinolone ointment (KENALOG) 0.5 % [[id number]]  Pharmacy [facility], [place], [place] [address]  Phone:  [phone] Fax:  [phone]  DEA #:  --   Please advise and thank you

## 2020-12-04 NOTE — Addendum Note (Signed)
Addended by: Camillia Herter on: 12/04/2020 12:43 PM   Modules accepted: Orders

## 2020-12-04 NOTE — Telephone Encounter (Signed)
Referral placed to Dermatology as recommended per patient's PCP.

## 2020-12-04 NOTE — Telephone Encounter (Signed)
Can referral be placed?  Per PCP OV note in February, 2022 regarding rash:   9. Rash and nonspecific skin eruption Given location and history of DM, potentially acanthosis nigricans, although does not appear classically like this. Will obtain A1c. Trial of more potent steroid given history of pruritic rash following exposure to plant. If no improvement, recommended derm referral.  - Hemoglobin A1c; Future - triamcinolone ointment (KENALOG) 0.5 %; Apply 1 application topically 2 (two) times daily.  Dispense: 30 g; Refill: 1

## 2020-12-28 ENCOUNTER — Telehealth: Payer: Self-pay | Admitting: Primary Care

## 2020-12-28 MED ORDER — DOXYCYCLINE HYCLATE 100 MG PO TABS: 100.0000 mg | ORAL_TABLET | Freq: Two times a day (BID) | ORAL | 0 refills | Status: DC

## 2020-12-28 NOTE — Telephone Encounter (Signed)
Primary Pulmonologist: Wert Last office visit and with whom: 07/11/20 with Nicole Spence What do we see them for (pulmonary problems): COPD Last OV assessment/plan: Assessment & Plan Note by Martyn Ehrich, NP at 07/11/2020 9:35 AM  Author: Martyn Ehrich, NP Author Type: Nurse Practitioner Filed: 07/11/2020 9:37 AM  Note Status: Bernell List: Cosign Not Required Encounter Date: 07/11/2020  Problem: Chronic respiratory failure with hypoxia [facility])  Editor: Martyn Ehrich, NP (Nurse Practitioner)      Prior Versions: 1. Martyn Ehrich, NP (Nurse Practitioner) at 07/11/2020 9:36 AM - Written    - Continues to benefit from supplemental oxygen; using 3L pulsed O2 on exertion and 2L at night        Patient Instructions by Martyn Ehrich, NP at 07/11/2020 9:00 AM  Author: Martyn Ehrich, NP Author Type: Nurse Practitioner Filed: 07/11/2020 9:18 AM  Note Status: Addendum Cosign: Cosign Not Required Encounter Date: 07/11/2020  Editor: Martyn Ehrich, NP (Nurse Practitioner)      Prior Versions: 1. Martyn Ehrich, NP (Nurse Practitioner) at 07/11/2020 9:15 AM - Signed    Recommendations: Continue Claritin and nasal rinses  Continue Stiolto 2 puffs once daily  Call insurance and ask what LABA/LAMA is formulary on plan OR alternative to [employer]   Orders: CXR today   Rx: Refilled albuterol (sent to [facility])  Follow-up: 6 months with Dr. Melvyn Novas (bring all medication with you)        Was appointment offered to patient (explain)?  Pt wants recommendations   Reason for call: Called and spoke with pt who states she has had complaints of cough, head congestion, and drainage. Pt said that at first the nasal drainage was yellow but then she began taking mucinex.  Pt said now that she has been taking mucinex, the phlegm is clear.  Pt also has some wheezing.  Pt has had no relief with her cough since beginning mucinex. Pt was originally wearing her O2 at 2L at  night and during the day as needed but states that she has been having to pretty much wear her O2 all the time now.  Pt has not had to use her rescue inhaler. Pt is using her stiolto inhaler as well as her other meds as prescribed.  Pt is requesting to have abx sent to the pharmacy to see if that would help with her symptoms. Nicole Spence, please advise.    Allergies  Allergen Reactions  . Adhesive [Tape] Other (See Comments)    IRRITATES SKIN     Immunization History  Administered Date(s) Administered  . Fluad Quad(high Dose 65+) 05/11/2019  . Influenza, High Dose Seasonal PF 09/20/2018, 05/24/2020  . Influenza,inj,Quad PF,6+ Mos 05/04/2014, 09/21/2015, 06/17/2016  . Influenza-Unspecified 04/26/2019  . PFIZER(Purple Top)SARS-COV-2 Vaccination 10/15/2019, 11/08/2019, 05/24/2020  . Pneumococcal Conjugate-13 01/25/2014  . Pneumococcal Polysaccharide-23 03/14/2015  . Td 03/17/2017  . Tdap 12/24/2006  . Zoster 05/26/2014

## 2020-12-28 NOTE — Telephone Encounter (Signed)
Continue mucinex 600-1200mg  twice daily and use rescue inhaler if having shortness of breath/wheezing. She may benefit from flonase if not use. I have sent in doxycycline. Take with food and wear sunscreen while outside.

## 2020-12-28 NOTE — Telephone Encounter (Signed)
I have LM on VM for the pt to call us back.

## 2020-12-28 NOTE — Telephone Encounter (Signed)
Spoke with the pt and notified of response per BW. She verbalized understanding. Rx was sent to pharm already.

## 2021-01-08 ENCOUNTER — Other Ambulatory Visit: Payer: Self-pay

## 2021-01-08 ENCOUNTER — Ambulatory Visit (INDEPENDENT_AMBULATORY_CARE_PROVIDER_SITE_OTHER): Payer: Medicare Other | Admitting: Primary Care

## 2021-01-08 ENCOUNTER — Ambulatory Visit: Payer: Medicare Other | Admitting: Internal Medicine

## 2021-01-08 ENCOUNTER — Ambulatory Visit (INDEPENDENT_AMBULATORY_CARE_PROVIDER_SITE_OTHER): Payer: Medicare Other

## 2021-01-08 ENCOUNTER — Encounter: Payer: Self-pay | Admitting: Primary Care

## 2021-01-08 VITALS — BP 112/74 | HR 102 | Temp 97.0°F | Ht 65.0 in | Wt 179.0 lb

## 2021-01-08 DIAGNOSIS — J449 Chronic obstructive pulmonary disease, unspecified: Secondary | ICD-10-CM | POA: Diagnosis not present

## 2021-01-08 DIAGNOSIS — J302 Other seasonal allergic rhinitis: Secondary | ICD-10-CM | POA: Diagnosis not present

## 2021-01-08 DIAGNOSIS — J441 Chronic obstructive pulmonary disease with (acute) exacerbation: Secondary | ICD-10-CM

## 2021-01-08 DIAGNOSIS — J9611 Chronic respiratory failure with hypoxia: Secondary | ICD-10-CM | POA: Diagnosis not present

## 2021-01-08 MED ORDER — STIOLTO RESPIMAT 2.5-2.5 MCG/ACT IN AERS: 2.0000 | INHALATION_SPRAY | Freq: Every day | RESPIRATORY_TRACT | 0 refills | Status: DC

## 2021-01-08 MED ORDER — PREDNISONE 10 MG PO TABS: ORAL_TABLET | ORAL | 0 refills | Status: DC

## 2021-01-08 NOTE — Assessment & Plan Note (Addendum)
-   Continue Neti pot 1-2 times a day as needed for nasal congestion - Continue Claritin and Flonase nasal spray once daily - Try over the counter chlorpheniramine 4mg  every 4 hours as needed for allergy symptoms/cough

## 2021-01-08 NOTE — Assessment & Plan Note (Addendum)
-   AECOPD likely exacerbated by seasonal allergies. Treated for sinobronchitis symptoms last week with doxycycline. Continues to have some chest congestion, cough and sob. Lungs were mostly clear on exam, scant rhonchi right base. Checking CXR today.  - Continue Stiolto Respimat two puffs daily; albuterol rescue inhaler every 4-6 hours as needed only for breakthrough shortness of breath/wheezing  - Rx prednisone 20mg  x 5 days; 10mg  x 5 days; stop  - FU in 6 months or sooner if needed

## 2021-01-08 NOTE — Progress Notes (Signed)
@Patient  ID: Nicole Spence, female    DOB: Jan 22, 1946, 75 y.o.   MRN: [id number]  Chief Complaint  Patient presents with  . Follow-up    Reports congestion, cough, and shortness of breath    Referring provider: Maximiano Coss, NP  Brief patient profile:  Quit smoking 2015 noted onset of doe age [age] with sense of nasal drainage/ sore throat and eventually placed on spiriva then added symbicort but actually much better since started reflux meds early April 2019 and referred to pulmonary clinic 12/18/2017 by Dr   Reginia Forts p ent eval around 2017 pos gerd but had stopped PPI due to reported potential side effects in media . HPI: 75 year old, former smoker quit in 2015 (30-pack-year history).  Past medical history significant for COPD Gold I, chronic respiratory failure with hypoxia, seasonal allergic rhinitis, community-acquired pneumonia, essential hypertension, obesity.  Patient of Dr. Melvyn Novas. Maintained on Stiolto Respimat.   Previous [facility] pulmonary encounter: 01/09/2020 Patient presents today for 6 months follow-up. States that her breathing is baseline. She is compliant with stiolto using it once daily in the morning. Rare SABA use. She wakes up with some morning congestion. She is on 2L continuous, uses 3L on exertion. She remains active with cycling and stretching. She uses 4L when exercising.  She is up to date with COVID vaccine.   07/11/2020 Patient presents today for 6 month follow-up. She is doing pretty well. Breathing overall all is ok except for when humidity is high. She is compliant with Stiolto, she is currently in the donut hole. Sometimes she will take 1 puff to make her medication last longer. She uses 2L oxygen at night and 3L pulsed when she exerts herself. Current has a productive cough with associated sinus congestion. She is coughing up brown to red mucus. She thinks its related to her sinuses because she has been having some morning nose bleeds. She has not needed to  use her albuterol rescue inhaler. She is up to date with Covid and Influenza vaccines.   01/08/2021- Interim hx  Patient presents today for 34-month follow-up.  She called our office on 12/28/2020 with complaints of sinusitis symptoms and cough. She was sent in a prescription for doxycycline 100 mg 1 tab twice daily x7 days. Advised her to continue Mucinex [phone] mg twice daily, use rescue inhaler Q 4-6 hours if having shortness of breath or wheezing and add Flonase nasal spray. She is feeling a lot better. She continues to have chest congestion, cough and shortness of breath. She has not tried using her albuterol rescue inhaler. She is taking Claritin 10mg  daily, using netti pot every day which helps temporarily and flonase nasal spray. She stopped taking mucinex because she went through two bottles.    Pulmonary function testing: PFT's  01/29/2018  FEV1 1.81  (87 % ) ratio 67  p 16 % improvement from saba p nothing prior to study with DLCO  32 % corrects to 46  % for alv volume    Allergies  Allergen Reactions  . Adhesive [Tape] Other (See Comments)    IRRITATES SKIN     Immunization History  Administered Date(s) Administered  . Fluad Quad(high Dose 65+) 05/11/2019  . Influenza, High Dose Seasonal PF 09/20/2018, 05/24/2020  . Influenza,inj,Quad PF,6+ Mos 05/04/2014, 09/21/2015, 06/17/2016  . Influenza-Unspecified 04/26/2019  . PFIZER(Purple Top)SARS-COV-2 Vaccination 10/15/2019, 11/08/2019, 05/24/2020  . Pneumococcal Conjugate-13 01/25/2014  . Pneumococcal Polysaccharide-23 03/14/2015  . Td 03/17/2017  . Tdap 12/24/2006  .  Zoster 05/26/2014    Past Medical History:  Diagnosis Date  . Cataract   . Colon polyps 02/09/2007   [facility]; colonoscopy.  Marland Kitchen COPD (chronic obstructive pulmonary disease) ([place])   . Diverticula of colon 02/09/2007  . Glucose intolerance (impaired glucose tolerance)   . Hypertension   . Internal hemorrhoids 02/09/2007.  Marland Kitchen Pneumonia 07/2018    Tobacco  History: Social History   Tobacco Use  Smoking Status Former Smoker  . Packs/day: 1.00  . Years: 30.00  . Pack years: 30.00  . Types: Cigarettes  . Quit date: 10/23/2013  . Years since quitting: 7.2  Smokeless Tobacco Never Used   Counseling given: Not Answered   Outpatient Medications Prior to Visit  Medication Sig Dispense Refill  . famotidine (PEPCID) 20 MG tablet TAKE 1 TABLET BY MOUTH  TWICE DAILY AFTER BREAKFAST AND AFTER SUPPER 180 tablet 3  . fluticasone (FLONASE) 50 MCG/ACT nasal spray Place 2 sprays into both nostrils daily. 16 g 5  . hydrochlorothiazide (HYDRODIURIL) 25 MG tablet Take 1 tablet (25 mg total) by mouth daily. 90 tablet 3  . loratadine (CLARITIN) 10 MG tablet Take 1 tablet (10 mg total) by mouth daily. 30 tablet 11  . Multiple Vitamins-Minerals (WOMENS 50+ MULTI VITAMIN/MIN) TABS Take by mouth daily.    . OXYGEN 2lpm with sleep and 3lpm with exertion AHC    . potassium chloride SA (KLOR-CON) 20 MEQ tablet Take 1 tablet (20 mEq total) by mouth daily. 90 tablet 3  . PROAIR HFA 108 (90 Base) MCG/ACT inhaler Inhale 2 puffs into the lungs every 4 (four) hours as needed for wheezing or shortness of breath. 18 g 3  . Respiratory Therapy Supplies (FLUTTER) DEVI Use 3 times a day as needed for congestion. 1 each 0  . STIOLTO RESPIMAT 2.5-2.5 MCG/ACT AERS USE 2 INHALATIONS BY MOUTH  DAILY 12 g 3  . triamcinolone ointment (KENALOG) 0.5 % Apply 1 application topically 2 (two) times daily. 30 g 1  . doxycycline (VIBRA-TABS) 100 MG tablet Take 1 tablet (100 mg total) by mouth 2 (two) times daily. 14 tablet 0   No facility-administered medications prior to visit.    Review of Systems  Review of Systems  Constitutional: Negative.  Negative for chills, fatigue and fever.  HENT: Positive for congestion and postnasal drip.   Respiratory: Positive for cough and shortness of breath. Negative for chest tightness.   Cardiovascular: Negative.     Physical Exam  BP 112/74  (BP Location: Left Arm, Cuff Size: Normal)   Pulse (!) 102   Temp (!) 97 F (36.1 C) (Temporal)   Ht 5\' 5"  (1.651 m)   Wt 179 lb (81.2 kg)   SpO2 98% Comment: RA  BMI 29.79 kg/m  Physical Exam Constitutional:      Appearance: Normal appearance.  HENT:     Head: Normocephalic and atraumatic.     Mouth/Throat:     Mouth: Mucous membranes are moist.     Pharynx: Oropharynx is clear.  Cardiovascular:     Rate and Rhythm: Normal rate and regular rhythm.  Pulmonary:     Effort: Pulmonary effort is normal.     Breath sounds: Normal breath sounds.  Musculoskeletal:        General: Normal range of motion.  Skin:    General: Skin is warm and dry.  Neurological:     General: No focal deficit present.     Mental Status: She is alert and oriented to person, place, and  time. Mental status is at baseline.  Psychiatric:        Mood and Affect: Mood normal.        Behavior: Behavior normal.        Thought Content: Thought content normal.        Judgment: Judgment normal.      Lab Results:  CBC    Component Value Date/Time   WBC 9.2 10/03/2020 1001   WBC 10.8 (A) 09/01/2018 1613   WBC 7.7 08/21/2018 0549   RBC 4.96 10/03/2020 1001   RBC 5.40 09/01/2018 1613   RBC 4.77 08/21/2018 0549   HGB 15.1 10/03/2020 1001   HCT 46.1 10/03/2020 1001   PLT 265 10/03/2020 1001   MCV 93 10/03/2020 1001   MCH 30.4 10/03/2020 1001   MCH 30.0 09/01/2018 1613   MCH 29.4 08/21/2018 0549   MCHC 32.8 10/03/2020 1001   MCHC 34.0 09/01/2018 1613   MCHC 31.7 08/21/2018 0549   RDW 13.3 10/03/2020 1001   LYMPHSABS 2.1 09/23/2019 0927   MONOABS 0.7 08/20/2018 1908   EOSABS 0.2 09/23/2019 0927   BASOSABS 0.1 09/23/2019 0927    BMET    Component Value Date/Time   NA 140 03/20/2020 0905   K 3.6 03/20/2020 0905   CL 99 03/20/2020 0905   CO2 28 03/20/2020 0905   GLUCOSE 97 03/20/2020 0905   GLUCOSE 151 (H) 08/21/2018 0549   BUN 10 03/20/2020 0905   CREATININE 0.94 03/20/2020 0905   CREATININE  0.88 06/17/2016 0821   CALCIUM 9.7 03/20/2020 0905   GFRNONAA 61 03/20/2020 0905   GFRNONAA 56 (L) 07/26/2014 0951   GFRAA 71 03/20/2020 0905   GFRAA 65 07/26/2014 0951    BNP    Component Value Date/Time   BNP 34.4 08/20/2018 1903    ProBNP No results found for: PROBNP  Imaging: No results found.   Assessment & Plan:   COPD GOLD I - AECOPD likely exacerbated by seasonal allergies. Treated for sinobronchitis symptoms last week with doxycycline. Continues to have some chest congestion, cough and sob. Lungs were mostly clear on exam, scant rhonchi right base. Checking CXR today.  - Continue Stiolto Respimat two puffs daily; albuterol rescue inhaler every 4-6 hours as needed only for breakthrough shortness of breath/wheezing  - Rx prednisone 20mg  x 5 days; 10mg  x 5 days; stop  - FU in 6 months or sooner if needed    Other seasonal allergic rhinitis - Continue Neti pot 1-2 times a day as needed for nasal congestion - Continue Claritin and Flonase nasal spray once daily - Try over the counter chlorpheniramine 4mg  every 4 hours as needed for allergy symptoms/cough   Chronic respiratory failure with hypoxia (HCC) - Continues to benefit from supplemental oxygen; using 2-3L POC and 2L at night    Martyn Ehrich, NP 01/08/2021

## 2021-01-08 NOTE — Patient Instructions (Addendum)
Nice seeing you today Ms Rane  Recommendations: - Continue Stiolto two puffs in morning as directed  - Use albuterol rescue inhaler every  4-6 hours as needed only for breakthrough shortness of breath/wheezing  - Continue Netti pot 1-2 times a day as needed for nasal congestion - Continue Claritin and Flonase nasal spray once daily - Try over the counter chlorpheniramine (chlor-tabs) 4mg  every 4 hours as needed for allergy symptoms/cough (may cause drowsiness -use caution)   Orders: - CXR today   Follow-up: - 6 months with Dr. Melvyn Novas or sooner if needed (bring medication in hand with you)

## 2021-01-08 NOTE — Assessment & Plan Note (Addendum)
-   Continues to benefit from supplemental oxygen; using 2-3L POC and 2L at night

## 2021-01-10 ENCOUNTER — Telehealth: Payer: Self-pay | Admitting: Internal Medicine

## 2021-01-10 DIAGNOSIS — R21 Rash and other nonspecific skin eruption: Secondary | ICD-10-CM

## 2021-01-10 NOTE — Telephone Encounter (Signed)
Pt called in stating she needs a refill on   potassium chloride SA (KLOR-CON) 20 MEQ tablet   Pharmacy  [facility], [address], [address], [address], [place] [address]  Phone:  [phone] Fax:  [phone]

## 2021-01-10 NOTE — Telephone Encounter (Signed)
Pt also asking if she should continue using this until she sees the dermatologist   triamcinolone ointment (KENALOG) 0.5 %   Please call pt to let her know.

## 2021-01-10 NOTE — Telephone Encounter (Signed)
Pt is asking to be referred to another dermatologist because she can't be seen until August.

## 2021-01-11 MED ORDER — TRIAMCINOLONE ACETONIDE 0.5 % EX OINT: 1.0000 "application " | TOPICAL_OINTMENT | Freq: Two times a day (BID) | CUTANEOUS | 1 refills | Status: DC

## 2021-01-11 NOTE — Progress Notes (Signed)
Please let patient know CXR showed chronic bronchitis changes, if she is still having sob or cough symptoms would recommend getting CT chest wo contrast to ensure she does not have any fibrotic areas. Thanks

## 2021-01-11 NOTE — Telephone Encounter (Signed)
Rx not filled by PCP, Dr. Juleen China.  Will route to PCP to see if RF is applicable.

## 2021-01-11 NOTE — Addendum Note (Signed)
Addended by: Carilyn Goodpasture on: 01/11/2021 12:43 PM   Modules accepted: Orders

## 2021-01-14 ENCOUNTER — Other Ambulatory Visit: Payer: Self-pay | Admitting: *Deleted

## 2021-01-14 ENCOUNTER — Other Ambulatory Visit: Payer: Self-pay | Admitting: Internal Medicine

## 2021-01-14 DIAGNOSIS — J441 Chronic obstructive pulmonary disease with (acute) exacerbation: Secondary | ICD-10-CM

## 2021-01-14 DIAGNOSIS — R059 Cough, unspecified: Secondary | ICD-10-CM

## 2021-01-14 MED ORDER — POTASSIUM CHLORIDE CRYS ER 20 MEQ PO TBCR: 20.0000 meq | EXTENDED_RELEASE_TABLET | Freq: Every day | ORAL | 0 refills | Status: DC

## 2021-01-15 ENCOUNTER — Telehealth: Payer: Self-pay | Admitting: Primary Care

## 2021-01-15 NOTE — Telephone Encounter (Signed)
I called and spoke with pt and she is aware of her appt for the CT scan.  She did not have any questions.  Nothing further is  Needed.

## 2021-01-18 NOTE — Telephone Encounter (Signed)
Pt is calling and wanting to know why her prescription has not been filled. Pt is out of her potassium chloride sa (klor-con) 20 meq tablets.

## 2021-01-22 NOTE — Telephone Encounter (Signed)
Rx was sent on 01/14/2021 to pharmacy.

## 2021-01-23 DIAGNOSIS — Z23 Encounter for immunization: Secondary | ICD-10-CM | POA: Diagnosis not present

## 2021-01-26 ENCOUNTER — Ambulatory Visit
Admission: RE | Admit: 2021-01-26 | Discharge: 2021-01-26 | Disposition: A | Discharge status: Home / Self Care | Payer: Medicare Other | Source: Ambulatory Visit | Attending: Primary Care | Admitting: Primary Care

## 2021-01-26 ENCOUNTER — Other Ambulatory Visit: Payer: Self-pay

## 2021-01-26 DIAGNOSIS — I2510 Atherosclerotic heart disease of native coronary artery without angina pectoris: Secondary | ICD-10-CM | POA: Diagnosis not present

## 2021-01-26 DIAGNOSIS — J929 Pleural plaque without asbestos: Secondary | ICD-10-CM | POA: Diagnosis not present

## 2021-01-26 DIAGNOSIS — J439 Emphysema, unspecified: Secondary | ICD-10-CM | POA: Diagnosis not present

## 2021-01-26 DIAGNOSIS — R059 Cough, unspecified: Secondary | ICD-10-CM

## 2021-01-26 DIAGNOSIS — J441 Chronic obstructive pulmonary disease with (acute) exacerbation: Secondary | ICD-10-CM

## 2021-01-26 DIAGNOSIS — J479 Bronchiectasis, uncomplicated: Secondary | ICD-10-CM | POA: Diagnosis not present

## 2021-01-28 NOTE — Progress Notes (Signed)
Please let patient know CT chest showed advanced emphysematous changes and pulmonary scarring. Stable to slightly progressive areas of interstitial thickening, subpleural reticulation and architectural distortion suggesting some component of fibrotic interstitial lung disease.   Please schedule her a visit with Dr. Melvyn Novas , his first available to review

## 2021-02-17 ENCOUNTER — Other Ambulatory Visit: Payer: Self-pay | Admitting: Internal Medicine

## 2021-02-22 DIAGNOSIS — Z20822 Contact with and (suspected) exposure to covid-19: Secondary | ICD-10-CM | POA: Diagnosis not present

## 2021-03-20 ENCOUNTER — Ambulatory Visit (INDEPENDENT_AMBULATORY_CARE_PROVIDER_SITE_OTHER): Payer: Medicare Other | Admitting: Internal Medicine

## 2021-03-20 ENCOUNTER — Encounter: Payer: Self-pay | Admitting: Internal Medicine

## 2021-03-20 ENCOUNTER — Ambulatory Visit (INDEPENDENT_AMBULATORY_CARE_PROVIDER_SITE_OTHER): Payer: Medicare Other

## 2021-03-20 ENCOUNTER — Other Ambulatory Visit: Payer: Self-pay

## 2021-03-20 DIAGNOSIS — J439 Emphysema, unspecified: Secondary | ICD-10-CM | POA: Diagnosis not present

## 2021-03-20 DIAGNOSIS — J449 Chronic obstructive pulmonary disease, unspecified: Secondary | ICD-10-CM

## 2021-03-20 DIAGNOSIS — J9611 Chronic respiratory failure with hypoxia: Secondary | ICD-10-CM | POA: Diagnosis not present

## 2021-03-20 MED ORDER — STIOLTO RESPIMAT 2.5-2.5 MCG/ACT IN AERS: 2.0000 | INHALATION_SPRAY | Freq: Every day | RESPIRATORY_TRACT | 0 refills | Status: DC

## 2021-03-20 NOTE — Progress Notes (Signed)
Spoke with pt and notified of results per Dr. Wert. Pt verbalized understanding and denied any questions. 

## 2021-03-20 NOTE — Progress Notes (Signed)
Subjective:    Patient ID: Nicole Spence, female   DOB: [birth year],    MRN: [id number]    Brief patient profile:  75 yobf  Quit smoking 2015 noted onset of doe age [age] with sense of nasal drainage/ sore throat and eventually placed on spiriva then added symbicort but actually much better since started reflux meds early April 2019 and referred to pulmonary clinic 12/18/2017 by Dr   Reginia Forts p ent eval around 2017 pos gerd but had stopped PPI due to reported potential side effects in media .    History of Present Illness  12/18/2017 1st [facility] office visit/ Jc Veron   Chief Complaint  Patient presents with   Pulmonary Consult    Referred by Dr. Windell Moment for eval of COPD. Pt states she was told years ago she had chronic bronchitis "but it never really bothered me". She has SOB with exertion such as cleaning her house or washing her car. She has an albuterol inhaler that she rarely uses.   doe indolent onset minimal progressive since age 75  Esp carrying groceries in from car has trouble with steps and walks slower than avg people never tries saba [facility] = can't walk a nl pace on a flat grade s sob but does fine slow and flat  Takes  symbicort x 2 first thing am/ then spriva lunchtime and then symb 160   Cough better on otc gerd rx  Sleep ok  rec Stop spiriva and symbicort  Plan A = Automatic = stiolto 2 pffs each am  And continue the omeprazole Take 30-60 min before first meal of the day  Work on inhaler technique:   Plan B = Backup Only use your albuterol as a rescue medication   GERD diet      10/20/2018  f/u ov/[place] re: copd gold I / 02 at hs and ex  But not at rest @ 2lpm  Chief Complaint  Patient presents with   Follow-up    Breathing has improved some since the last visit.   Dyspnea:  MMRC2 = can't walk a nl pace on a flat grade s sob but does fine slow and flat eg walking at [employer] pushing the cart s 02 not checking  Cough: no Sleeping: side  on bed on 4 inches SABA use: not at all  02: 2lpm with house work / sleeping        07/25/2019  f/u ov/Kyrene Longan re:  GOLD I on stiolto / new sciatica/R plantar fasciitis limiting  Chief Complaint  Patient presents with   Follow-up    Breathing is doing well and she has not been using her albuterol recently. No new co'.s    Dyspnea:  R leg limiting  Cough: none  Sleeping: 4 in elevation HOB/ 2pm hs  SABA use: none 02: 2lpm  Hs,  And none at rest, up to 3 lpm with activity Rec No need for 02 at rest unless sats are less than 90% Please schedule a follow up visit in 6  months but call sooner if needed    03/20/2021  f/u ov/Alverna Fawley re: GOLD I  maint on stiolto  Chief Complaint  Patient presents with   Follow-up    Breathing is overall doing well. She has not used her rescue inhaler in the past few wks.    Dyspnea:  eliptical x 30 min x 3 x weekly plus stretching and back to baseline  Cough: none  Sleeping: 4 in  elevation/ 2 pillows SABA use: none now  02: 2lpm hs and prn daytime up to 3 POC up to [employer]/ [employer] parking (whereas used to go s it Covid status:   vax x 4    No obvious day to day or daytime variability or assoc excess/ purulent sputum or mucus plugs or hemoptysis or cp or chest tightness, subjective wheeze or overt sinus or hb symptoms.   Sleeping  without nocturnal  or early am exacerbation  of respiratory  c/o's or need for noct saba. Also denies any obvious fluctuation of symptoms with weather or environmental changes or other aggravating or alleviating factors except as outlined above   No unusual exposure hx or h/o childhood pna/ asthma or knowledge of premature birth.  Current Allergies, Complete Past Medical History, Past Surgical History, Family History, and Social History were reviewed in [employer] record.  ROS  The following are not active complaints unless bolded Hoarseness, sore throat, dysphagia, dental problems, itching, sneezing,   nasal congestion or discharge of excess mucus or purulent secretions, ear ache,   fever, chills, sweats, unintended wt loss or wt gain, classically pleuritic or exertional cp,  orthopnea pnd or arm/hand swelling  or leg swelling, presyncope, palpitations, abdominal pain, anorexia, nausea, vomiting, diarrhea  or change in bowel habits or change in bladder habits, change in stools or change in urine, dysuria, hematuria,  rash, arthralgias, visual complaints, headache, numbness, weakness or ataxia or problems with walking or coordination,  change in mood or  memory.        Current Meds  Medication Sig   famotidine (PEPCID) 20 MG tablet TAKE 1 TABLET BY MOUTH  TWICE DAILY AFTER BREAKFAST AND AFTER SUPPER   fluticasone (FLONASE) 50 MCG/ACT nasal spray Place 2 sprays into both nostrils daily.   hydrochlorothiazide (HYDRODIURIL) 25 MG tablet Take 1 tablet (25 mg total) by mouth daily.   loratadine (CLARITIN) 10 MG tablet Take 1 tablet (10 mg total) by mouth daily.   Multiple Vitamins-Minerals (WOMENS 50+ MULTI VITAMIN/MIN) TABS Take by mouth daily.   OXYGEN 2lpm with sleep and 3lpm with exertion AHC   potassium chloride SA (KLOR-CON) 20 MEQ tablet Take 1 tablet (20 mEq total) by mouth daily.   PROAIR HFA 108 (90 Base) MCG/ACT inhaler Inhale 2 puffs into the lungs every 4 (four) hours as needed for wheezing or shortness of breath.   Respiratory Therapy Supplies (FLUTTER) DEVI Use 3 times a day as needed for congestion.   STIOLTO RESPIMAT 2.5-2.5 MCG/ACT AERS USE 2 INHALATIONS BY MOUTH  DAILY                             Objective:   Physical Exam   03/20/2021           178 07/25/2019         201  10/20/2018          189  09/03/2018          186  08/02/2018        189   01/29/2018         185   12/18/17 183 lb (83 kg)  11/18/17 183 lb (83 kg)  10/12/17 183 lb (83 kg)      Vital signs reviewed  03/20/2021  - Note at rest 02 sats  93% on RA   General appearance:    amb pleasant bf nad     HEENT :  pt wearing mask not removed for exam due to covid - 19 concerns.   NECK :  without JVD/Nodes/TM/ nl carotid upstrokes bilaterally   LUNGS: no acc muscle use,  Min barrel  contour chest wall with bilateral  slightly decreased bs s audible wheeze and  without cough on insp or exp maneuvers and min  Hyperresonant  to  percussion bilaterally     CV:  RRR  no s3 or murmur or increase in P2, and no edema   ABD:  soft and nontender with pos end  insp Hoover's  in the supine position. No bruits or organomegaly appreciated, bowel sounds nl  MS:   Nl gait/  ext warm without deformities, calf tenderness, cyanosis or clubbing No obvious joint restrictions   SKIN: warm and dry without lesions    NEURO:  alert, approp, nl sensorium with  no motor or cerebellar deficits apparent.           CXR PA and Lateral:   03/20/2021 :    I personally reviewed images and agree with radiology impression as follows:    Changes of emphysema and no evidence of superimposed acute cardiopulmonary disease My review:  no evidence of evolving ILD  Assessment:

## 2021-03-20 NOTE — Patient Instructions (Signed)
Make sure you check your oxygen saturation  at your highest level of activity  to be sure it stays over 90% and adjust  02 flow upward to maintain this level if needed but remember to turn it back to previous settings when you stop (to conserve your supply).    Please remember to go to the  x-ray department  for your tests - we will call you with the results when they are available     Please schedule PFT's prior to next office visit

## 2021-03-20 NOTE — Assessment & Plan Note (Signed)
Quit smoking 2015 - 12/18/2017  Walked RA x 3 laps @ 185 ft each stopped due to  End of study, fast pace, sats 90% at end  -  Spirometry 12/18/2017  FEV1 1.63 (83%)  Ratio 65 - 12/18/2017  > changed from  symbicort /spiriva dpi to stiolto - PFT's  01/29/2018  FEV1 1.81  (87 % ) ratio 67  p 16 % improvement from saba p nothing prior to study with DLCO  32 % corrects to 46  % for alv volume   - 09/03/2018  After extensive coaching inhaler device,  effectiveness =    90% with smi  - starting rehab 10/21/18 > did x 3 weeks prior to covid then stopped   Pt is Group B in terms of symptom/risk and laba/lama therefore appropriate rx at this point >>>  Continue stiolto and approp saba.  Re saba: I spent extra time with pt today reviewing appropriate use of albuterol for prn use on exertion with the following points: 1) saba is for relief of sob that does not improve by walking a slower pace or resting but rather if the pt does not improve after trying this first. 2) If the pt is convinced, as many are, that saba helps recover from activity faster then it's easy to tell if this is the case by re-challenging : ie stop, take the inhaler, then p 5 minutes try the exact same activity (intensity of workload) that just caused the symptoms and see if they are substantially diminished or not after saba 3) if there is an activity that reproducibly causes the symptoms, try the saba 15 min before the activity on alternate days   If in fact the saba really does help, then fine to continue to use it prn but advised may need to look closer at the maintenance regimen being used to achieve better control of airways disease with exertion.

## 2021-03-20 NOTE — Assessment & Plan Note (Addendum)
08/02/2018   Room Air at Rest = 93%  >> Room Air while Ambulating = 83% Patient Saturations on 2 Liters of oxygen while Ambulating = 94%>  rx 2lpm with activity  - placed on 24 h 02 at d/c from [facility] p dx pna 08/23/18 - refer for BEST fit for amb 02  09/03/2018  - 09/03/2018   Walked RA x  X 320f - stopped due to  Sob and desat to 87% corrected on 2lpm at avg pace so rec 02 2lpm hs and with exertion and referred to dme for BEST fit for amb 02 > not done - . 03/20/2021   Walked RA  2 laps @ approx 257feach @ avg pace  stopped due to desats to 85% corrected on 3lpm POC   ? Whether now developing any progressive forms  ILD  (doubtful based on how well she's doing with eliptical though note sats a bit lower on RA with ex)  so rec baseline cxr and return for full pfts then maybe HRCT to further define ? UIP vs other (no risk factors for other identified)  In meantime rec: Make sure you check your oxygen saturation  at your highest level of activity  to be sure it stays over 90% and adjust  02 flow upward to maintain this level if needed but remember to turn it back to previous settings when you stop (to conserve your supply).   Each maintenance medication was reviewed in detail including emphasizing most importantly the difference between maintenance and prns and under what circumstances the prns are to be triggered using an action plan format where appropriate.  Total time for H and P, chart review, counseling, [facility] , directly observing portions of ambulatory 02 saturation study/ and generating customized AVS unique to this office visit / same day charting  > 30 min

## 2021-03-21 ENCOUNTER — Encounter: Payer: Self-pay | Admitting: Internal Medicine
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
## 2021-03-25 ENCOUNTER — Other Ambulatory Visit: Payer: Self-pay

## 2021-03-25 ENCOUNTER — Encounter: Payer: Self-pay | Admitting: Family

## 2021-03-25 ENCOUNTER — Ambulatory Visit: Payer: Medicare Other | Admitting: Internal Medicine

## 2021-03-25 ENCOUNTER — Ambulatory Visit (INDEPENDENT_AMBULATORY_CARE_PROVIDER_SITE_OTHER): Payer: Medicare Other | Admitting: Family

## 2021-03-25 VITALS — BP 116/75 | HR 88 | Temp 98.3°F | Resp 18 | Ht 65.0 in | Wt 175.8 lb

## 2021-03-25 DIAGNOSIS — Z23 Encounter for immunization: Secondary | ICD-10-CM

## 2021-03-25 DIAGNOSIS — I10 Essential (primary) hypertension: Secondary | ICD-10-CM | POA: Diagnosis not present

## 2021-03-25 MED ORDER — HYDROCHLOROTHIAZIDE 25 MG PO TABS: 25.0000 mg | ORAL_TABLET | Freq: Every day | ORAL | 0 refills | Status: DC

## 2021-03-25 MED ORDER — POTASSIUM CHLORIDE CRYS ER 20 MEQ PO TBCR: 20.0000 meq | EXTENDED_RELEASE_TABLET | Freq: Every day | ORAL | 0 refills | Status: DC

## 2021-03-25 NOTE — Progress Notes (Signed)
Pt presents for hypertension follow-up, needs medication refills on potassium and HCTZ sent into [facility] mail delivery Desires Shingrix vaccine

## 2021-03-25 NOTE — Patient Instructions (Signed)

## 2021-03-26 ENCOUNTER — Other Ambulatory Visit: Payer: Self-pay | Admitting: Family

## 2021-03-26 DIAGNOSIS — Z1231 Encounter for screening mammogram for malignant neoplasm of breast: Secondary | ICD-10-CM

## 2021-03-26 LAB — BASIC METABOLIC PANEL
BUN/Creatinine Ratio: 7 — ABNORMAL LOW (ref 12–28)
BUN: 6 mg/dL — ABNORMAL LOW (ref 8–27)
CO2: 25 mmol/L (ref 20–29)
Calcium: 9.9 mg/dL (ref 8.7–10.3)
Chloride: 99 mmol/L (ref 96–106)
Creatinine, Ser: 0.89 mg/dL (ref 0.57–1.00)
Glucose: 83 mg/dL (ref 65–99)
Potassium: 3.7 mmol/L (ref 3.5–5.2)
Sodium: 141 mmol/L (ref 134–144)
eGFR: 69 mL/min/{1.73_m2} (ref >59)

## 2021-03-26 NOTE — Progress Notes (Signed)
Kidney function normal

## 2021-04-04 DIAGNOSIS — L810 Postinflammatory hyperpigmentation: Secondary | ICD-10-CM | POA: Diagnosis not present

## 2021-04-04 DIAGNOSIS — L308 Other specified dermatitis: Secondary | ICD-10-CM | POA: Diagnosis not present

## 2021-05-02 ENCOUNTER — Ambulatory Visit (INDEPENDENT_AMBULATORY_CARE_PROVIDER_SITE_OTHER): Payer: Medicare Other | Admitting: Internal Medicine

## 2021-05-02 ENCOUNTER — Other Ambulatory Visit: Payer: Self-pay

## 2021-05-02 ENCOUNTER — Other Ambulatory Visit: Payer: Self-pay | Admitting: Internal Medicine

## 2021-05-02 DIAGNOSIS — Z23 Encounter for immunization: Secondary | ICD-10-CM | POA: Diagnosis not present

## 2021-05-02 DIAGNOSIS — U071 COVID-19: Secondary | ICD-10-CM | POA: Diagnosis not present

## 2021-05-02 DIAGNOSIS — J449 Chronic obstructive pulmonary disease, unspecified: Secondary | ICD-10-CM

## 2021-05-02 MED ORDER — STIOLTO RESPIMAT 2.5-2.5 MCG/ACT IN AERS: 2.0000 | INHALATION_SPRAY | Freq: Every day | RESPIRATORY_TRACT | 0 refills | Status: DC

## 2021-05-02 NOTE — Progress Notes (Signed)
Full PFT performed today. °

## 2021-05-02 NOTE — Patient Instructions (Signed)
Full PFT performed today. °

## 2021-05-05 LAB — PULMONARY FUNCTION TEST
DL/VA % pred: 58 %
DL/VA: 2.37 ml/min/mmHg/L
DLCO cor % pred: 41 %
DLCO cor: 8.67 ml/min/mmHg
DLCO unc % pred: 41 %
DLCO unc: 8.67 ml/min/mmHg
FEF 25-75 Post: 1.13 L/sec
FEF 25-75 Pre: 2.22 L/sec
FEF 25-75 Pre: 1.07 L/sec
FEF2575-%Change-Post: 5 %
FEF2575-%Pred-Post: 62 %
FEF2575-%Pred-Pre: 123 %
FEF2575-%Pred-Pre: 59 %
FEV1-%Change-Post: 4 %
FEV1-%Pred-Post: 88 %
FEV1-%Pred-Pre: 104 %
FEV1-%Pred-Pre: 84 %
FEV1-Post: 1.77 L
FEV1-Pre: 2.1 L
FEV1-Pre: 1.7 L
FEV1FVC-%Change-Post: 3 %
FEV1FVC-%Pred-Pre: 107 %
FEV1FVC-%Pred-Pre: 87 %
FEV6-%Change-Post: 0 %
FEV6-%Pred-Post: 100 %
FEV6-%Pred-Pre: 101 %
FEV6-%Pred-Pre: 100 %
FEV6-Post: 2.52 L
FEV6-Pre: 2.53 L
FEV6-Pre: 2.52 L
FEV6FVC-%Change-Post: 0 %
FEV6FVC-%Pred-Post: 103 %
FEV6FVC-%Pred-Pre: 103 %
FEV6FVC-%Pred-Pre: 103 %
FVC-%Change-Post: 0 %
FVC-%Pred-Post: 97 %
FVC-%Pred-Pre: 97 %
FVC-%Pred-Pre: 97 %
FVC-Post: 2.53 L
FVC-Pre: 2.53 L
FVC-Pre: 2.52 L
Post FEV1/FVC ratio: 70 %
Post FEV6/FVC ratio: 99 %
Pre FEV1/FVC ratio: 83 %
Pre FEV1/FVC ratio: 68 %
Pre FEV6/FVC Ratio: 100 %
Pre FEV6/FVC Ratio: 100 %
RV % pred: 64 %
RV: 1.51 L
TLC % pred: 88 %
TLC: 4.82 L

## 2021-05-13 ENCOUNTER — Other Ambulatory Visit: Payer: Self-pay | Admitting: Gastroenterology

## 2021-05-13 DIAGNOSIS — R0602 Shortness of breath: Secondary | ICD-10-CM | POA: Diagnosis not present

## 2021-05-13 DIAGNOSIS — Z1211 Encounter for screening for malignant neoplasm of colon: Secondary | ICD-10-CM | POA: Diagnosis not present

## 2021-05-13 DIAGNOSIS — J449 Chronic obstructive pulmonary disease, unspecified: Secondary | ICD-10-CM | POA: Diagnosis not present

## 2021-05-16 ENCOUNTER — Other Ambulatory Visit: Payer: Self-pay

## 2021-05-16 ENCOUNTER — Ambulatory Visit
Admission: RE | Admit: 2021-05-16 | Discharge: 2021-05-16 | Disposition: A | Discharge status: Home / Self Care | Payer: Medicare Other | Source: Ambulatory Visit | Attending: Family | Admitting: Family

## 2021-05-16 DIAGNOSIS — Z1231 Encounter for screening mammogram for malignant neoplasm of breast: Secondary | ICD-10-CM

## 2021-05-16 DIAGNOSIS — Z23 Encounter for immunization: Secondary | ICD-10-CM | POA: Diagnosis not present

## 2021-05-26 ENCOUNTER — Other Ambulatory Visit: Payer: Self-pay | Admitting: Family

## 2021-05-26 DIAGNOSIS — I10 Essential (primary) hypertension: Secondary | ICD-10-CM

## 2021-06-25 ENCOUNTER — Ambulatory Visit: Payer: Medicare Other | Admitting: Family Medicine

## 2021-06-25 DIAGNOSIS — H35373 Puckering of macula, bilateral: Secondary | ICD-10-CM | POA: Diagnosis not present

## 2021-06-25 DIAGNOSIS — E119 Type 2 diabetes mellitus without complications: Secondary | ICD-10-CM | POA: Diagnosis not present

## 2021-06-25 DIAGNOSIS — H26492 Other secondary cataract, left eye: Secondary | ICD-10-CM | POA: Diagnosis not present

## 2021-06-25 DIAGNOSIS — H04123 Dry eye syndrome of bilateral lacrimal glands: Secondary | ICD-10-CM | POA: Diagnosis not present

## 2021-06-25 DIAGNOSIS — Z961 Presence of intraocular lens: Secondary | ICD-10-CM | POA: Diagnosis not present

## 2021-06-26 ENCOUNTER — Encounter: Payer: Self-pay | Admitting: Family Medicine

## 2021-06-26 ENCOUNTER — Ambulatory Visit (INDEPENDENT_AMBULATORY_CARE_PROVIDER_SITE_OTHER): Payer: Medicare Other | Admitting: Family Medicine

## 2021-06-26 ENCOUNTER — Other Ambulatory Visit: Payer: Self-pay

## 2021-06-26 VITALS — BP 119/76 | HR 90 | Temp 98.2°F | Resp 16 | Wt 176.0 lb

## 2021-06-26 DIAGNOSIS — K219 Gastro-esophageal reflux disease without esophagitis: Secondary | ICD-10-CM | POA: Diagnosis not present

## 2021-06-26 DIAGNOSIS — J9611 Chronic respiratory failure with hypoxia: Secondary | ICD-10-CM

## 2021-06-26 DIAGNOSIS — I10 Essential (primary) hypertension: Secondary | ICD-10-CM

## 2021-06-26 MED ORDER — HYDROCHLOROTHIAZIDE 25 MG PO TABS: 25.0000 mg | ORAL_TABLET | Freq: Every day | ORAL | 1 refills | Status: DC

## 2021-06-26 MED ORDER — POTASSIUM CHLORIDE CRYS ER 20 MEQ PO TBCR: 20.0000 meq | EXTENDED_RELEASE_TABLET | Freq: Every day | ORAL | 1 refills | Status: DC

## 2021-06-26 NOTE — Progress Notes (Signed)
Established Patient Office Visit  Subjective:  Patient ID: Nicole Spence, female    DOB: Sep 06, 1945  Age: 75 y.o. MRN: [id number]  CC:  Chief Complaint  Patient presents with   Follow-up    HPI Nicole Spence presents for follow up of chronic med issues with refills. Patient denies acute complaints or concerns.   Past Medical History:  Diagnosis Date   Cataract    Colon polyps 02/09/2007   [facility]; colonoscopy.   COPD (chronic obstructive pulmonary disease) ([place])    Diverticula of colon 02/09/2007   Glucose intolerance (impaired glucose tolerance)    Hypertension    Internal hemorrhoids 02/09/2007.   Pneumonia 07/2018      Social History   Socioeconomic History   Marital status: Legally Separated    Spouse name: Not on file   Number of children: 2   Years of education: Not on file   Highest education level: Bachelor's degree (e.g., BA, AB, BS)  Occupational History   Occupation: retired    Comment: 2008  Tobacco Use   Smoking status: Former    Packs/day: 1.00    Years: 30.00    Pack years: 30.00    Types: Cigarettes    Quit date: 10/23/2013    Years since quitting: 7.6   Smokeless tobacco: Never  Vaping Use   Vaping Use: Former   Devices: vaped for approx 5 yrs but unsure of time frame//lmr   Substance and Sexual Activity   Alcohol use: Yes    Alcohol/week: 4.0 standard drinks    Types: 4 Glasses of wine per week   Drug use: No   Sexual activity: Not Currently  Other Topics Concern   Not on file  Social History Narrative   Marital status: divorced since 25 years. Not dating; not interested.       Children: 2 children (45, 44); 5 grandchildren; 2 gg.      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         Tobacco: electronic cigarette since 2011. No longer using electronic cigarettes in 2015.       Alcohol: weekends; beer x 1-2per week. No DWIs.      Drugs: none       Exercise: no exercise       Seatbelt:  100%; no texting.        Guns: unloaded; locked up.      ADLs: independent with all ADLs; no assistant devices for ambulation.  Drives.        Living Will:  No living will; desires FULL CODE but no prolonged measures.   Social Determinants of Health   Financial Resource Strain: Not on file  Food Insecurity: Not on file  Transportation Needs: Not on file  Physical Activity: Not on file  Stress: Not on file  Social Connections: Not on file  Intimate Partner Violence: Not on file    ROS Review of Systems  All other systems reviewed and are negative.  Objective:   Today's Vitals: BP 119/76   Pulse 90   Temp 98.2 F (36.8 C) (Oral)   Resp 16   Wt 176 lb (79.8 kg)   SpO2 93% Comment: pt has 3L while up moving and 2L sleeping  BMI 29.29 kg/m   Physical Exam Vitals and nursing note reviewed.  Constitutional:      General: Nicole Spence is not in acute distress. Cardiovascular:     Rate and Rhythm: Normal rate and regular rhythm.  Pulmonary:  Effort: Pulmonary effort is normal.     Breath sounds: Normal breath sounds.     Comments: On supplemental oxygen Abdominal:     Palpations: Abdomen is soft.     Tenderness: There is no abdominal tenderness.  Neurological:     General: No focal deficit present.     Mental Status: Nicole Spence is alert and oriented to person, place, and time.    Assessment & Plan:   1. Essential hypertension Readings at goal. Appears stable. Monitoring labs ordered.  Continue present management. Meds refilled.  - Basic Metabolic Panel - Lipid Panel - AST - ALT - hydrochlorothiazide (HYDRODIURIL) 25 MG tablet; Take 1 tablet (25 mg total) by mouth daily.  Dispense: 90 tablet; Refill: 1 - potassium chloride SA (KLOR-CON) 20 MEQ tablet; Take 1 tablet (20 mEq total) by mouth daily.  Dispense: 90 tablet; Refill: 1  2. Gastroesophageal reflux disease without esophagitis Appears stable.   3. Chronic respiratory failure with hypoxia (HCC) Appears stable. Management as per  consultant  Outpatient Encounter Medications as of 06/26/2021  Medication Sig   famotidine (PEPCID) 20 MG tablet TAKE 1 TABLET BY MOUTH  TWICE DAILY AFTER BREAKFAST AND AFTER SUPPER   fluticasone (FLONASE) 50 MCG/ACT nasal spray Place 2 sprays into both nostrils daily.   loratadine (CLARITIN) 10 MG tablet Take 1 tablet (10 mg total) by mouth daily.   Multiple Vitamins-Minerals (WOMENS 50+ MULTI VITAMIN/MIN) TABS Take by mouth daily.   OXYGEN 2lpm with sleep and 3lpm with exertion AHC   PROAIR HFA 108 (90 Base) MCG/ACT inhaler Inhale 2 puffs into the lungs every 4 (four) hours as needed for wheezing or shortness of breath.   Respiratory Therapy Supplies (FLUTTER) DEVI Use 3 times a day as needed for congestion.   Tiotropium Bromide-Olodaterol (STIOLTO RESPIMAT) 2.5-2.5 MCG/ACT AERS Inhale 2 puffs into the lungs daily.   Tiotropium Bromide-Olodaterol (STIOLTO RESPIMAT) 2.5-2.5 MCG/ACT AERS Inhale 2 puffs into the lungs daily.   [DISCONTINUED] hydrochlorothiazide (HYDRODIURIL) 25 MG tablet TAKE 1 TABLET BY MOUTH  DAILY   [DISCONTINUED] potassium chloride SA (KLOR-CON) 20 MEQ tablet TAKE 1 TABLET BY MOUTH  DAILY   hydrochlorothiazide (HYDRODIURIL) 25 MG tablet Take 1 tablet (25 mg total) by mouth daily.   potassium chloride SA (KLOR-CON) 20 MEQ tablet Take 1 tablet (20 mEq total) by mouth daily.   No facility-administered encounter medications on file as of 06/26/2021.    Follow-up: Return in about 6 months (around 12/24/2021) for follow up.   Becky Sax, MD

## 2021-06-26 NOTE — Progress Notes (Signed)
Patient is here for f/up with no new concerns today's.

## 2021-06-27 LAB — LIPID PANEL
Chol/HDL Ratio: 2.9 ratio (ref 0.0–4.4)
Cholesterol, Total: 164 mg/dL (ref 100–199)
HDL: 56 mg/dL (ref >39)
LDL Chol Calc (NIH): 89 mg/dL (ref 0–99)
Triglycerides: 108 mg/dL (ref 0–149)
VLDL Cholesterol Cal: 19 mg/dL (ref 5–40)

## 2021-06-27 LAB — AST: AST: 19 IU/L (ref 0–40)

## 2021-06-27 LAB — BASIC METABOLIC PANEL
BUN/Creatinine Ratio: 11 — ABNORMAL LOW (ref 12–28)
BUN: 10 mg/dL (ref 8–27)
CO2: 27 mmol/L (ref 20–29)
Calcium: 10.2 mg/dL (ref 8.7–10.3)
Chloride: 98 mmol/L (ref 96–106)
Creatinine, Ser: 0.91 mg/dL (ref 0.57–1.00)
Glucose: 91 mg/dL (ref 70–99)
Potassium: 4 mmol/L (ref 3.5–5.2)
Sodium: 141 mmol/L (ref 134–144)
eGFR: 67 mL/min/{1.73_m2} (ref >59)

## 2021-06-27 LAB — ALT: ALT: 13 IU/L (ref 0–32)

## 2021-07-03 ENCOUNTER — Encounter (HOSPITAL_COMMUNITY): Payer: Self-pay | Admitting: Gastroenterology

## 2021-07-03 NOTE — Progress Notes (Signed)
Attempted to obtain medical history via telephone, unable to reach at this time. I left a voicemail to return pre surgical testing department's phone call.  

## 2021-07-09 ENCOUNTER — Ambulatory Visit (INDEPENDENT_AMBULATORY_CARE_PROVIDER_SITE_OTHER): Payer: Medicare Other | Admitting: Internal Medicine

## 2021-07-09 ENCOUNTER — Other Ambulatory Visit: Payer: Self-pay

## 2021-07-09 DIAGNOSIS — J449 Chronic obstructive pulmonary disease, unspecified: Secondary | ICD-10-CM

## 2021-07-09 DIAGNOSIS — J9611 Chronic respiratory failure with hypoxia: Secondary | ICD-10-CM | POA: Diagnosis not present

## 2021-07-09 MED ORDER — DULERA 100-5 MCG/ACT IN AERO: INHALATION_SPRAY | RESPIRATORY_TRACT | 11 refills | Status: DC

## 2021-07-09 MED ORDER — AMOXICILLIN-POT CLAVULANATE 875-125 MG PO TABS: 1.0000 | ORAL_TABLET | Freq: Two times a day (BID) | ORAL | 0 refills | Status: AC

## 2021-07-09 NOTE — Progress Notes (Signed)
Subjective:    Patient ID: Nicole Spence, female   DOB: 1946/04/15,    MRN: [id number]    Brief patient profile:  17 yobf  Quit smoking 2015 noted onset of doe age [age] with sense of nasal drainage/ sore throat and eventually placed on spiriva then added symbicort but actually much better since started reflux meds early April 2019 and referred to pulmonary clinic 12/18/2017 by Dr   Reginia Forts p ent eval around 2017 pos gerd but had stopped PPI due to reported potential side effects in media .    History of Present Illness  12/18/2017 1st [facility] office visit/ Seth Friedlander   Chief Complaint  Patient presents with   Pulmonary Consult    Referred by Dr. Windell Moment for eval of COPD. Pt states she was told years ago she had chronic bronchitis "but it never really bothered me". She has SOB with exertion such as cleaning her house or washing her car. She has an albuterol inhaler that she rarely uses.   doe indolent onset minimal progressive since age 70  Esp carrying groceries in from car has trouble with steps and walks slower than avg people never tries saba [facility] = can't walk a nl pace on a flat grade s sob but does fine slow and flat  Takes  symbicort x 2 first thing am/ then spriva lunchtime and then symb 160   Cough better on otc gerd rx  Sleep ok  rec Stop spiriva and symbicort  Plan A = Automatic = stiolto 2 pffs each am  And continue the omeprazole Take 30-60 min before first meal of the day  Work on inhaler technique:   Plan B = Backup Only use your albuterol as a rescue medication   GERD diet      10/20/2018  f/u ov/Sandon Yoho re: copd gold I / 02 at hs and ex  But not at rest @ 2lpm  Chief Complaint  Patient presents with   Follow-up    Breathing has improved some since the last visit.   Dyspnea:  MMRC2 = can't walk a nl pace on a flat grade s sob but does fine slow and flat eg walking at [employer] pushing the cart s 02 not checking  Cough: no Sleeping: side  on bed on 4 inches SABA use: not at all  02: 2lpm with house work / sleeping        07/25/2019  f/u ov/Bobbi Yount re:  GOLD I on stiolto / new sciatica/R plantar fasciitis limiting  Chief Complaint  Patient presents with   Follow-up    Breathing is doing well and she has not been using her albuterol recently. No new co'.s    Dyspnea:  R leg limiting  Cough: none  Sleeping: 4 in elevation HOB/ 2pm hs  SABA use: none 02: 2lpm  Hs,  And none at rest, up to 3 lpm with activity Rec No need for 02 at rest unless sats are less than 90% Please schedule a follow up visit in 6  months but call sooner if needed    03/20/2021  f/u ov/Adelaida Reindel re: GOLD I  maint on stiolto  Chief Complaint  Patient presents with   Follow-up    Breathing is overall doing well. She has not used her rescue inhaler in the past few wks.    Dyspnea:  eliptical x 30 min x 3 x weekly plus stretching and back to baseline  Cough: none  Sleeping: 4 in  elevation/ 2 pillows SABA use: none now  02: 2lpm hs and prn daytime up to 3 POC up to [employer]/ [employer] parking (whereas used to go s it Covid status:   vax x 4  Rec Make sure you check your oxygen saturation  at your highest level of activity    Please schedule PFT's 05/05/21 nl spirometry    07/09/2021  f/u ov/Trase Bunda re: Actually  GOLD 0/02 dep with ex    maint on stiiolto daily   Chief Complaint  Patient presents with   Follow-up    Recent sinus infection for 3 weeks.  Bloody nose, some yellow.  No cough.  Drainage in throat.   Dyspnea:  eliptical x 30  min s rescue on 3lpm  Cough: still in am /worse x 3 weeks in setting of nasal congestion/ epistaxis with some purulent nasal discharge SABA use: not really understanding how/ whn to use  02: 2lpm and none at rest and up to 3lpm but not checking sats  Covid status:   vax x 4    No obvious day to day or daytime variability or assoc  mucus plugs or hemoptysis or cp or chest tightness, subjective wheeze or overt  hb symptoms.      Also denies any obvious fluctuation of symptoms with weather or environmental changes or other aggravating or alleviating factors except as outlined above   No unusual exposure hx or h/o childhood pna/ asthma or knowledge of premature birth.  Current Allergies, Complete Past Medical History, Past Surgical History, Family History, and Social History were reviewed in [employer] record.  ROS  The following are not active complaints unless bolded Hoarseness, sore throat, dysphagia, dental problems, itching, sneezing,  nasal congestion or discharge of excess mucus or purulent secretions, ear ache,   fever, chills, sweats, unintended wt loss or wt gain, classically pleuritic or exertional cp,  orthopnea pnd or arm/hand swelling  or leg swelling, presyncope, palpitations, abdominal pain, anorexia, nausea, vomiting, diarrhea  or change in bowel habits or change in bladder habits, change in stools or change in urine, dysuria, hematuria,  rash, arthralgias, visual complaints, headache, numbness, weakness or ataxia or problems with walking or coordination,  change in mood or  memory.        Current Meds  Medication Sig   famotidine (PEPCID) 20 MG tablet TAKE 1 TABLET BY MOUTH  TWICE DAILY AFTER BREAKFAST AND AFTER SUPPER   fluticasone (FLONASE) 50 MCG/ACT nasal spray Place 2 sprays into both nostrils daily.   hydrochlorothiazide (HYDRODIURIL) 25 MG tablet Take 1 tablet (25 mg total) by mouth daily.   hydroquinone 4 % cream Apply 1 application topically daily.   loratadine (CLARITIN) 10 MG tablet Take 1 tablet (10 mg total) by mouth daily.   Multiple Vitamins-Minerals (WOMENS 50+ MULTI VITAMIN/MIN) TABS Take 1 tablet by mouth daily.   OXYGEN Inhale 2-3 L into the lungs See admin instructions. 2lpm with sleep and 3lpm with exertion   Polyvinyl Alcohol-Povidone PF (REFRESH) 1.4-0.6 % SOLN Place 1 drop into both eyes daily.   potassium chloride SA (KLOR-CON) 20 MEQ tablet Take 1  tablet (20 mEq total) by mouth daily.   PROAIR HFA 108 (90 Base) MCG/ACT inhaler Inhale 2 puffs into the lungs every 4 (four) hours as needed for wheezing or shortness of breath.   Respiratory Therapy Supplies (FLUTTER) DEVI Use 3 times a day as needed for congestion.   Tiotropium Bromide-Olodaterol (STIOLTO RESPIMAT) 2.5-2.5 MCG/ACT AERS Inhale 2 puffs into  the lungs daily.                           Objective:   Physical Exam   07/09/2021         185  03/20/2021           178 07/25/2019         201  10/20/2018          189  09/03/2018          186  08/02/2018        189   01/29/2018         185   12/18/17 183 lb (83 kg)  11/18/17 183 lb (83 kg)  10/12/17 183 lb (83 kg)      Vital signs reviewed  07/09/2021  - Note at rest 02 sats  93% on RA   General appearance:    amb bf nad    HEENT : pt wearing mask not removed for exam due to covid - 19 concerns.   NECK :  without JVD/Nodes/TM/ nl carotid upstrokes bilaterally   LUNGS: no acc muscle use,  Min barrel  contour chest wall with bilateral  slightly decreased bs s audible wheeze and  without cough on insp or exp maneuvers and min  Hyperresonant  to  percussion bilaterally     CV:  RRR  no s3 or murmur or increase in P2, and no edema   ABD:  soft and nontender with pos end  insp Hoover's  in the supine position. No bruits or organomegaly appreciated, bowel sounds nl  MS:   Nl gait/  ext warm without deformities, calf tenderness, cyanosis or clubbing No obvious joint restrictions   SKIN: warm and dry without lesions    NEURO:  alert, approp, nl sensorium with  no motor or cerebellar deficits apparent.            I personally reviewed images and agree with radiology impression as follows:  CXR:   03/20/21 pa and lateral  Changes of emphysema and no evidence of superimposed acute cardiopulmonary disease   Assessment:

## 2021-07-09 NOTE — Patient Instructions (Addendum)
Make sure you check your oxygen saturation  AT  your highest level of activity (not after you stop)   to be sure it stays over 90% and adjust  02 flow upward to maintain this level if needed but remember to turn it back to previous settings when you stop (to conserve your supply).   Plan A = Automatic = Always=    Dulera 100 Take 2 puffs first thing in am and then another 2 puffs about 12 hours later.    Work on inhaler technique:  relax and gently blow all the way out then take a nice smooth full deep breath back in, triggering the inhaler at same time you start breathing in.  Hold for up to 5 seconds if you can. Blow out thru nose. Rinse and gargle with water when done.  If mouth or throat bother you at all,  try brushing teeth/gums/tongue with arm and hammer toothpaste/ make a slurry and gargle and spit out.      Plan B = Backup (to supplement plan A, not to replace it) Only use your albuterol inhaler as a rescue medication to be used if you can't catch your breath by resting or doing a relaxed purse lip breathing pattern.  - The less you use it, the better it will work when you need it. - Ok to use the inhaler up to 2 puffs  every 4 hours if you must but call for appointment if use goes up over your usual need - Don't leave home without it !!  (think of it like the spare tire for your car)   Ok to try albuterol 15 min before an activity (on alternating days)  that you know would usually make you short of breath and see if it makes any difference and if makes none then don't take albuterol after activity unless you can't catch your breath as this means it's the resting that helps, not the albuterol.      Augmentin 875 mg take one pill twice daily  X 10 days - take at breakfast and supper with large glass of water.  It would help reduce the usual side effects (diarrhea and yeast infections) if you ate cultured yogurt at lunch.   Please schedule a follow up visit in 3 months but call sooner if  needed

## 2021-07-10 ENCOUNTER — Encounter: Payer: Self-pay | Admitting: Internal Medicine

## 2021-07-10 NOTE — Assessment & Plan Note (Signed)
Quit smoking 2015 - 12/18/2017  Walked RA x 3 laps @ 185 ft each stopped due to  End of study, fast pace, sats 90% at end  -  Spirometry 12/18/2017  FEV1 1.63 (83%)  Ratio 65 - 12/18/2017  > changed from  symbicort /spiriva dpi to stiolto - PFT's  01/29/2018  FEV1 1.81  (87 % ) ratio 67  p 16 % improvement from saba p nothing prior to study with DLCO  32 % corrects to 46  % for alv volume   - 09/03/2018  After extensive coaching inhaler device,  effectiveness =    90% with smi  - starting rehab 10/21/18 > did x 3 weeks prior to covid then stopped  - PFT's  05/02/21  FEV1 1.77 (88 % ) ratio 0.70 ( 66% if do fev1/vc)   p stiolto prior to study with DLCO  8.67 (41%) corrects to 2.37 (57%)  for alv volume and FV curve min curvature   - Spirometry  05/02/21   FEV1 2.10 (104%)  Ratio 0.83 min curvature p stiolto prior (study says it was symbicort but she assured it was stiolto as never had symbicort )  - 07/09/2021  After extensive coaching inhaler device,  effectiveness =    75% with hfa > changed to dulera 100   She has more of an AB problem related to sinus dz that copd at this point therefore rec  >>> change to dulera 100/ augmentin x 10 days and  approp saba   >>>> Re SABA :  I spent extra time with pt today reviewing appropriate use of albuterol for prn use on exertion with the following points: 1) saba is for relief of sob that does not improve by walking a slower pace or resting but rather if the pt does not improve after trying this first. 2) If the pt is convinced, as many are, that saba helps recover from activity faster then it's easy to tell if this is the case by re-challenging : ie stop, take the inhaler, then p 5 minutes try the exact same activity (intensity of workload) that just caused the symptoms and see if they are substantially diminished or not after saba 3) if there is an activity that reproducibly causes the symptoms, try the saba 15 min before the activity on alternate days   If in fact  the saba really does help, then fine to continue to use it prn but advised may need to look closer at the maintenance regimen being used to achieve better control of airways disease with exertion.

## 2021-07-10 NOTE — Assessment & Plan Note (Signed)
08/02/2018   Room Air at Rest = 93%  >> Room Air while Ambulating = 83% Patient Saturations on 2 Liters of oxygen while Ambulating = 94%>  rx 2lpm with activity  - placed on 24 h 02 at d/c from [facility] p dx pna 08/23/18 - refer for BEST fit for amb 02  09/03/2018  - 09/03/2018   Walked RA x  X 315ft - stopped due to  Sob and desat to 87% corrected on 2lpm at avg pace so rec 02 2lpm hs and with exertion and referred to dme for BEST fit for amb 02 > not done - . 03/20/2021   Walked RA  2 laps @ approx 254ft each @ avg pace  stopped due to desats to 85% corrected on 3lpm POC   Not checking 02 sats at peak ex so again advised:  Make sure you check your oxygen saturation  AT  your highest level of activity (not after you stop)   to be sure it stays over 90% and adjust  02 flow upward to maintain this level if needed but remember to turn it back to previous settings when you stop (to conserve your supply).           Each maintenance medication was reviewed in detail including emphasizing most importantly the difference between maintenance and prns and under what circumstances the prns are to be triggered using an action plan format where appropriate.  Total time for H and P, chart review, counseling, reviewing hfa/02 device(s) and generating customized AVS unique to this office visit / same day charting = 32 min

## 2021-07-11 ENCOUNTER — Ambulatory Visit: Payer: Medicare Other | Admitting: Internal Medicine

## 2021-07-11 NOTE — Anesthesia Preprocedure Evaluation (Addendum)
Anesthesia Evaluation  Patient identified by MRN, date of birth, ID band Patient awake    Reviewed: Allergy & Precautions, NPO status , Patient's Chart, lab work & pertinent test results  History of Anesthesia Complications Negative for: history of anesthetic complications  Airway Mallampati: II  TM Distance: >3 FB Neck ROM: Full    Dental  (+) Teeth Intact   Pulmonary COPD,  oxygen dependent, former smoker,    Pulmonary exam normal        Cardiovascular hypertension, Pt. on medications Normal cardiovascular exam   Stress test 2016: Low risk stress nuclear study with normal perfusion and reduced aerobic capacity with an exaggerrated BP response to exercise. NL LV Function, EF 81% post stress; NL Wall Motion   Neuro/Psych negative neurological ROS     GI/Hepatic Neg liver ROS, GERD  Medicated,  Endo/Other  negative endocrine ROS  Renal/GU negative Renal ROS  negative genitourinary   Musculoskeletal negative musculoskeletal ROS (+)   Abdominal   Peds  Hematology negative hematology ROS (+)   Anesthesia Other Findings   Reproductive/Obstetrics                            Anesthesia Physical Anesthesia Plan  ASA: 3  Anesthesia Plan: MAC   Post-op Pain Management:    Induction: Intravenous  PONV Risk Score and Plan: 2 and Propofol infusion, TIVA and Treatment may vary due to age or medical condition  Airway Management Planned: Natural Airway, Nasal Cannula and Simple Face Mask  Additional Equipment: None  Intra-op Plan:   Post-operative Plan:   Informed Consent: I have reviewed the patients History and Physical, chart, labs and discussed the procedure including the risks, benefits and alternatives for the proposed anesthesia with the patient or authorized representative who has indicated his/her understanding and acceptance.       Plan Discussed with:   Anesthesia Plan  Comments:        Anesthesia Quick Evaluation

## 2021-07-12 ENCOUNTER — Ambulatory Visit (HOSPITAL_COMMUNITY): Payer: Medicare Other | Admitting: Anesthesiology

## 2021-07-12 ENCOUNTER — Encounter (HOSPITAL_COMMUNITY)
Admission: RE | Disposition: A | Discharge status: Home / Self Care | Payer: Self-pay | Source: Home / Self Care | Attending: Gastroenterology

## 2021-07-12 ENCOUNTER — Other Ambulatory Visit: Payer: Self-pay

## 2021-07-12 ENCOUNTER — Encounter (HOSPITAL_COMMUNITY): Payer: Self-pay | Admitting: Gastroenterology

## 2021-07-12 ENCOUNTER — Ambulatory Visit (HOSPITAL_COMMUNITY)
Admission: RE | Admit: 2021-07-12 | Discharge: 2021-07-12 | Disposition: A | Discharge status: Home / Self Care | Payer: Medicare Other | Attending: Gastroenterology | Admitting: Gastroenterology

## 2021-07-12 DIAGNOSIS — E876 Hypokalemia: Secondary | ICD-10-CM | POA: Diagnosis not present

## 2021-07-12 DIAGNOSIS — D123 Benign neoplasm of transverse colon: Secondary | ICD-10-CM | POA: Insufficient documentation

## 2021-07-12 DIAGNOSIS — Z1211 Encounter for screening for malignant neoplasm of colon: Secondary | ICD-10-CM | POA: Insufficient documentation

## 2021-07-12 DIAGNOSIS — D1779 Benign lipomatous neoplasm of other sites: Secondary | ICD-10-CM | POA: Insufficient documentation

## 2021-07-12 DIAGNOSIS — K5730 Diverticulosis of large intestine without perforation or abscess without bleeding: Secondary | ICD-10-CM | POA: Diagnosis not present

## 2021-07-12 DIAGNOSIS — D124 Benign neoplasm of descending colon: Secondary | ICD-10-CM | POA: Insufficient documentation

## 2021-07-12 DIAGNOSIS — K219 Gastro-esophageal reflux disease without esophagitis: Secondary | ICD-10-CM | POA: Diagnosis not present

## 2021-07-12 DIAGNOSIS — D179 Benign lipomatous neoplasm, unspecified: Secondary | ICD-10-CM | POA: Diagnosis not present

## 2021-07-12 DIAGNOSIS — K635 Polyp of colon: Secondary | ICD-10-CM | POA: Diagnosis not present

## 2021-07-12 DIAGNOSIS — J449 Chronic obstructive pulmonary disease, unspecified: Secondary | ICD-10-CM | POA: Diagnosis not present

## 2021-07-12 DIAGNOSIS — Z9981 Dependence on supplemental oxygen: Secondary | ICD-10-CM | POA: Insufficient documentation

## 2021-07-12 DIAGNOSIS — Z139 Encounter for screening, unspecified: Secondary | ICD-10-CM | POA: Diagnosis not present

## 2021-07-12 DIAGNOSIS — Z87891 Personal history of nicotine dependence: Secondary | ICD-10-CM | POA: Insufficient documentation

## 2021-07-12 DIAGNOSIS — Z86010 Personal history of colonic polyps: Secondary | ICD-10-CM | POA: Diagnosis not present

## 2021-07-12 HISTORY — PX: POLYPECTOMY: SHX5525

## 2021-07-12 HISTORY — PX: COLONOSCOPY WITH PROPOFOL: SHX5780

## 2021-07-12 SURGERY — COLONOSCOPY WITH PROPOFOL
Anesthesia: Monitor Anesthesia Care

## 2021-07-12 MED ORDER — PROPOFOL 500 MG/50ML IV EMUL
INTRAVENOUS | Status: DC | PRN
  Administered 2021-07-12: 130 ug/kg/min via INTRAVENOUS

## 2021-07-12 MED ORDER — SODIUM CHLORIDE 0.9 % IV SOLN: INTRAVENOUS | Status: DC

## 2021-07-12 MED ORDER — LACTATED RINGERS IV SOLN: INTRAVENOUS | Status: DC | PRN

## 2021-07-12 MED ORDER — PROPOFOL 10 MG/ML IV BOLUS
INTRAVENOUS | Status: DC | PRN
  Administered 2021-07-12: 20 mg via INTRAVENOUS

## 2021-07-12 MED ORDER — PROPOFOL 1000 MG/100ML IV EMUL
INTRAVENOUS | Status: AC
  Filled 2021-07-12: qty 100

## 2021-07-12 SURGICAL SUPPLY — 21 items

## 2021-07-12 NOTE — Discharge Instructions (Signed)

## 2021-07-12 NOTE — Op Note (Signed)
[facility] Patient Name: Nicole Spence Procedure Date: 07/12/2021 MRN: [id number] Attending MD: Carol Ada , MD Date of Birth: 04-03-1946 CSN: [id number] Age: 75 Admit Type: Outpatient Procedure:                Colonoscopy Indications:              High risk colon cancer surveillance: Personal                            history of colonic polyps Providers:                Carol Ada, MD, Doristine Johns, RN, Cletis Athens, Technician, Lodema Hong Technician,                            Technician Referring MD:              Medicines:                 Complications:            No immediate complications. Estimated Blood Loss:     Estimated blood loss: none. Procedure:                Pre-Anesthesia Assessment:                           - Prior to the procedure, a History and Physical                            was performed, and patient medications and                            allergies were reviewed. The patient's tolerance of                            previous anesthesia was also reviewed. The risks                            and benefits of the procedure and the sedation                            options and risks were discussed with the patient.                            All questions were answered, and informed consent                            was obtained. Prior Anticoagulants: The patient has                            taken no previous anticoagulant or antiplatelet                            agents. ASA Grade Assessment: III - A patient with  severe systemic disease. After reviewing the risks                            and benefits, the patient was deemed in                            satisfactory condition to undergo the procedure.                           - Sedation was administered by an anesthesia                            professional. Deep sedation was attained.                            After obtaining informed consent, the colonoscope                            was passed under direct vision. Throughout the                            procedure, the patient's blood pressure, pulse, and                            oxygen saturations were monitored continuously. The                            CF-HQ190L ([id number]) Olympus colonoscope was                            introduced through the anus and advanced to the the                            cecum, identified by appendiceal orifice and                            ileocecal valve. The colonoscopy was performed                            without difficulty. The patient tolerated the                            procedure well. The quality of the bowel                            preparation was evaluated using the BBPS [facility]                            Bowel Preparation Scale) with scores of: Right                            Colon = 2 (minor amount of residual staining, small  fragments of stool and/or opaque liquid, but mucosa                            seen well), Transverse Colon = 2 (minor amount of                            residual staining, small fragments of stool and/or                            opaque liquid, but mucosa seen well) and Left Colon                            = 2 (minor amount of residual staining, small                            fragments of stool and/or opaque liquid, but mucosa                            seen well). The total BBPS score equals 6. The                            quality of the bowel preparation was good. Scope In: 7:37:29 AM Scope Out: 8:04:55 AM Scope Withdrawal Time: 0 hours 19 minutes 5 seconds  Total Procedure Duration: 0 hours 27 minutes 26 seconds  Findings:      Six sessile polyps were found in the recto-sigmoid colon, descending       colon and transverse colon. The polyps were 2 to 3 mm in size. These       polyps were removed with a cold snare.  Resection and retrieval were       complete.      A 10 mm polyp was found in the descending colon. The polyp was       pedunculated. The polyp was removed with a hot snare. Resection and       retrieval were complete.      Scattered small and large-mouthed diverticula were found in the entire       colon.      There was a medium-sized lipoma, in the ascending colon. Impression:               - Six 2 to 3 mm polyps at the recto-sigmoid colon,                            in the descending colon and in the transverse                            colon, removed with a cold snare. Resected and                            retrieved.                           - One 10 mm polyp in the descending colon, removed  with a hot snare. Resected and retrieved.                           - Diverticulosis in the entire examined colon.                           - Medium-sized lipoma in the ascending colon. Moderate Sedation:      Not Applicable - Patient had care per Anesthesia. Recommendation:           - Patient has a contact number available for                            emergencies. The signs and symptoms of potential                            delayed complications were discussed with the                            patient. Return to normal activities tomorrow.                            Written discharge instructions were provided to the                            patient.                           - Resume previous diet.                           - Continue present medications.                           - Await pathology results.                           - Repeat colonoscopy in 3 years for surveillance. Procedure Code(s):        --- Professional ---                           [phone], Colonoscopy, flexible; with removal of                            tumor(s), polyp(s), or other lesion(s) by snare                            technique Diagnosis Code(s):        --- Professional  ---                           K63.5, Polyp of colon                           Z86.010, Personal history of colonic polyps  D17.5, Benign lipomatous neoplasm of                            intra-abdominal organs                           K57.30, Diverticulosis of large intestine without                            perforation or abscess without bleeding CPT copyright 2019 American Medical Association. All rights reserved. The codes documented in this report are preliminary and upon coder review may  be revised to meet current compliance requirements. Carol Ada, MD Carol Ada, MD 07/12/2021 8:07:26 AM This report has been signed electronically. Number of Addenda: 0

## 2021-07-12 NOTE — Transfer of Care (Signed)
Immediate Anesthesia Transfer of Care Note  Patient: Nicole Spence  Procedure(s) Performed: COLONOSCOPY WITH PROPOFOL POLYPECTOMY  Patient Location: PACU and Endoscopy Unit  Anesthesia Type:MAC  Level of Consciousness: drowsy  Airway & Oxygen Therapy: Patient Spontanous Breathing and Patient connected to face mask oxygen  Post-op Assessment: Report given to RN, Post -op Vital signs reviewed and stable and Patient moving all extremities  Post vital signs: Reviewed and stable  Last Vitals:  Vitals Value Taken Time  BP 95/48 07/12/21 0804  Temp    Pulse 73 07/12/21 0806  Resp 18 07/12/21 0806  SpO2 100 % 07/12/21 0806  Vitals shown include unvalidated device data.  Last Pain:  Vitals:   07/12/21 0656  TempSrc: Oral  PainSc: 0-No pain         Complications: No notable events documented.
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
## 2021-07-12 NOTE — Anesthesia Postprocedure Evaluation (Signed)
Anesthesia Post Note  Patient: Nicole Spence  Procedure(s) Performed: COLONOSCOPY WITH PROPOFOL POLYPECTOMY     Patient location during evaluation: Endoscopy Anesthesia Type: MAC Level of consciousness: awake and alert Pain management: pain level controlled Vital Signs Assessment: post-procedure vital signs reviewed and stable Respiratory status: spontaneous breathing, nonlabored ventilation and respiratory function stable Cardiovascular status: blood pressure returned to baseline and stable Postop Assessment: no apparent nausea or vomiting Anesthetic complications: no   No notable events documented.  Last Vitals:  Vitals:   07/12/21 0821 07/12/21 0827  BP: 121/65 113/84  Pulse: 82 82  Resp: 19 17  Temp:    SpO2: 93% 93%    Last Pain:  Vitals:   07/12/21 0827  TempSrc:   PainSc: 0-No pain                 Lidia Collum

## 2021-07-15 ENCOUNTER — Encounter (HOSPITAL_COMMUNITY): Payer: Self-pay | Admitting: Gastroenterology

## 2021-07-15 LAB — SURGICAL PATHOLOGY

## 2021-07-17 DIAGNOSIS — Z20828 Contact with and (suspected) exposure to other viral communicable diseases: Secondary | ICD-10-CM | POA: Diagnosis not present

## 2021-07-31 ENCOUNTER — Other Ambulatory Visit: Payer: Self-pay | Admitting: Internal Medicine

## 2021-07-31 ENCOUNTER — Other Ambulatory Visit: Payer: Self-pay | Admitting: Family Medicine

## 2021-07-31 DIAGNOSIS — I10 Essential (primary) hypertension: Secondary | ICD-10-CM

## 2021-07-31 DIAGNOSIS — R0981 Nasal congestion: Secondary | ICD-10-CM

## 2021-09-13 ENCOUNTER — Telehealth: Payer: Self-pay | Admitting: Internal Medicine

## 2021-09-13 DIAGNOSIS — J449 Chronic obstructive pulmonary disease, unspecified: Secondary | ICD-10-CM

## 2021-09-13 MED ORDER — STIOLTO RESPIMAT 2.5-2.5 MCG/ACT IN AERS: 2.0000 | INHALATION_SPRAY | Freq: Every day | RESPIRATORY_TRACT | 1 refills | Status: DC

## 2021-09-13 NOTE — Telephone Encounter (Signed)
Fine with me - dose is 2 pffs each am

## 2021-09-13 NOTE — Telephone Encounter (Signed)
Called and spoke with patient. She is aware that MW is ok with her going back to [facility]. RX will be sent to [place].   Nothing further needed at time of call.

## 2021-09-13 NOTE — Telephone Encounter (Signed)
Called and spoke with patient. She stated that she was placed on Dulera 15mcg back in November 2022 after  being on Stiolto. While the [facility] did help some, it is not helping as much as the [facility]. With the [facility], she is having to use her albuterol inhaler more frequently. With the [facility], she hardly ever used the albuterol inhaler unless she was in the middle of a flare up.   She also mentioned that the [facility] was costing her $150 a month vs the Stiolto being $45 for 3 months. The $150 was after insurance had paid their part. She thought maybe she was in the donut hole and the price would change after the first of the year. The price did not change for 2023.   She is requesting to go back to the [facility] [employer]. Pharmacy is [facility].   MW, can you please advise? Thanks!

## 2021-10-15 ENCOUNTER — Ambulatory Visit: Payer: Medicare Other | Admitting: Internal Medicine

## 2021-10-22 ENCOUNTER — Encounter: Payer: Self-pay | Admitting: Internal Medicine

## 2021-10-22 ENCOUNTER — Ambulatory Visit (INDEPENDENT_AMBULATORY_CARE_PROVIDER_SITE_OTHER): Payer: Medicare Other | Admitting: Internal Medicine

## 2021-10-22 ENCOUNTER — Other Ambulatory Visit: Payer: Self-pay

## 2021-10-22 DIAGNOSIS — J449 Chronic obstructive pulmonary disease, unspecified: Secondary | ICD-10-CM

## 2021-10-22 DIAGNOSIS — J9611 Chronic respiratory failure with hypoxia: Secondary | ICD-10-CM

## 2021-10-22 MED ORDER — STIOLTO RESPIMAT 2.5-2.5 MCG/ACT IN AERS: 2.0000 | INHALATION_SPRAY | Freq: Every day | RESPIRATORY_TRACT | 0 refills | Status: DC

## 2021-10-22 NOTE — Assessment & Plan Note (Addendum)
Quit smoking 2015 - 12/18/2017  Walked RA x 3 laps @ 185 ft each stopped due to  End of study, fast pace, sats 90% at end  -  Spirometry 12/18/2017  FEV1 1.63 (83%)  Ratio 65 - 12/18/2017  > changed from  symbicort /spiriva dpi to stiolto - PFT's  01/29/2018  FEV1 1.81  (87 % ) ratio 67  p 16 % improvement from saba p nothing prior to study with DLCO  32 % corrects to 46  % for alv volume   - 09/03/2018  After extensive coaching inhaler device,  effectiveness =    90% with smi  - starting rehab 10/21/18 > did x 3 weeks prior to covid then stopped  - CT chest 01/26/21 c/w emphysema plus ? ILD  - PFT's  05/02/21  FEV1 1.77 (88 % ) ratio 0.70 ( 66% if do fev1/vc)   p stiolto prior to study with DLCO  8.67 (41%) corrects to 2.37 (57%)  for alv volume and FV curve min curvature   - Spirometry  05/02/21   FEV1 2.10 (104%)  Ratio 0.83 min curvature p stiolto prior (study says it was symbicort but she assured it was stiolto as never had symbicort )  - 07/09/2021  After extensive coaching inhaler device,  effectiveness =    75% with hfa > changed to dulera 100  - 09/13/2021 req change back to stiolto as saba use much higher on dulera   Pt is Group B in terms of symptom/risk and laba/lama therefore appropriate rx at this point >>>  Continue stiolto and approp saba  Re SABA :  I spent extra time with pt today reviewing appropriate use of albuterol for prn use on exertion with the following points: 1) saba is for relief of sob that does not improve by walking a slower pace or resting but rather if the pt does not improve after trying this first. 2) If the pt is convinced, as many are, that saba helps recover from activity faster then it's easy to tell if this is the case by re-challenging : ie stop, take the inhaler, then p 15 minutes try the exact same activity (intensity of workload) that just caused the symptoms and see if they are substantially diminished or not after saba 3) if there is an activity that reproducibly  causes the symptoms, try the saba 15 min before the activity on alternate days   If in fact the saba really does help, then fine to continue to use it prn but advised may need to look closer at the maintenance regimen being used to achieve better control of airways disease with exertion.    F/u 6 m, sooner if ex sats dropping, with HRCT then to f/u prior findings above

## 2021-10-22 NOTE — Addendum Note (Signed)
Addended by: Fran Lowes on: 10/22/2021 01:44 PM   Modules accepted: Orders

## 2021-10-22 NOTE — Patient Instructions (Addendum)
Make sure you check your oxygen saturation  AT  your highest level of activity (not after you stop)   to be sure it stays over 90% and adjust  02 flow upward to maintain this level if needed but remember to turn it back to previous settings when you stop (to conserve your supply).    Ok to try albuterol 15 min before an activity (on alternating days like starter fluid)  that you know would usually make you short of breath and see if it makes any difference and if makes none then don't take albuterol after activity unless you can't catch your breath as this means it's the resting that helps, not the albuterol.    Please schedule a follow up visit in 6 months but call sooner if needed - will need f/u HRCT then

## 2021-10-22 NOTE — Assessment & Plan Note (Signed)
08/02/2018   Room Air at Rest = 93%  >> Room Air while Ambulating = 83% Patient Saturations on 2 Liters of oxygen while Ambulating = 94%>  rx 2lpm with activity  - placed on 24 h 02 at d/c from [facility] p dx pna 08/23/18 - refer for BEST fit for amb 02  09/03/2018  - 09/03/2018   Walked RA x  X 326ft - stopped due to  Sob and desat to 87% corrected on 2lpm at avg pace so rec 02 2lpm hs and with exertion and referred to dme for BEST fit for amb 02 > not done - . 03/20/2021   Walked RA  2 laps @ approx 270ft each @ avg pace  stopped due to desats to 85% corrected on 3lpm POC   Again advised in detail using a carburetor analogy to help her understand 02 flow at high speeds has to match the fuel delivery or the engine won't run right.  Make sure you check your oxygen saturation  AT  your highest level of activity (not after you stop)   to be sure it stays over 90% and adjust  02 flow upward to maintain this level if needed but remember to turn it back to previous settings when you stop (to conserve your supply).          Each maintenance medication was reviewed in detail including emphasizing most importantly the difference between maintenance and prns and under what circumstances the prns are to be triggered using an action plan format where appropriate.  Total time for H and P, chart review, counseling, reviewing smi/respimat/hfa/02 device(s) and generating customized AVS unique to this office visit / same day charting  > 30 min

## 2021-10-22 NOTE — Progress Notes (Signed)
Subjective:    Patient ID: Nicole Spence, female   DOB: 1946-07-18,    MRN: [id number]    Brief patient profile:  48 yobf  Quit smoking 2015 noted onset of doe age [age] with sense of nasal drainage/ sore throat and eventually placed on spiriva then added symbicort but actually much better since started reflux meds early April 2019 and referred to pulmonary clinic 12/18/2017 by Dr   Reginia Forts p ent eval around 2017 pos gerd but had stopped PPI due to reported potential side effects in media .    History of Present Illness  12/18/2017 1st [name] office visit/ [name]   Chief Complaint  Patient presents with   Pulmonary Consult    Referred by Dr. Windell Moment for eval of COPD. Pt states she was told years ago she had chronic bronchitis "but it never really bothered me". She has SOB with exertion such as cleaning her house or washing her car. She has an albuterol inhaler that she rarely uses.   doe indolent onset minimal progressive since age 36  Esp carrying groceries in from car has trouble with steps and walks slower than avg people never tries saba [facility] = can't walk a nl pace on a flat grade s sob but does fine slow and flat  Takes  symbicort x 2 first thing am/ then spriva lunchtime and then symb 160   Cough better on otc gerd rx  Sleep ok  rec Stop spiriva and symbicort  Plan A = Automatic = stiolto 2 pffs each am  And continue the omeprazole Take 30-60 min before first meal of the day  Work on inhaler technique:   Plan B = Backup Only use your albuterol as a rescue medication   GERD diet      03/20/2021  f/u ov/[name] re: GOLD I  maint on stiolto  Chief Complaint  Patient presents with   Follow-up    Breathing is overall doing well. She has not used her rescue inhaler in the past few wks.    Dyspnea:  eliptical x 30 min x 3 x weekly plus stretching and back to baseline  Cough: none  Sleeping: 4 in elevation/ 2 pillows SABA use: none now  02: 2lpm hs and prn  daytime up to 3 POC up to [employer]/ [employer] parking (whereas used to go s it Covid status:   vax x 4  Rec Make sure you check your oxygen saturation  at your highest level of activity    Please schedule PFT's 05/05/21 nl spirometry    07/09/2021  f/u ov/[name] re: Actually  GOLD 0/02 dep with ex    maint on stiiolto daily   Chief Complaint  Patient presents with   Follow-up    Recent sinus infection for 3 weeks.  Bloody nose, some yellow.  No cough.  Drainage in throat.   Dyspnea:  eliptical x 30  min s rescue on 3lpm  Cough: still in am /worse x 3 weeks in setting of nasal congestion/ epistaxis with some purulent nasal discharge SABA use: not really understanding how/ whn to use  02: 2lpm and none at rest and up to 3lpm but not checking sats  Covid status:   vax x 4  Rec Make sure you check your oxygen saturation  AT  your highest level of activity (not after you stop)   to be sure it stays over 90% Plan A = Automatic = Always=    Dulera 100  Take 2 puffs first thing in am and then another 2 puffs about 12 hours later.  Work on inhaler technique:  Plan B = Backup (to supplement plan A, not to replace it) Only use your albuterol inhaler as a rescue medication  Ok to try albuterol 15 min before an activity (on alternating days)  that you know would usually make you short of breath  Augmentin 875 mg take one pill twice daily  X 10 day    10/22/2021  f/u ov/[name] re: GOLD 0 copdAB    maint on stiolto  and 02 hs, not using exertion  Chief Complaint  Patient presents with   Follow-up    Follow up. Patient says everything is going a lot better.   Dyspnea:  eliptical x 30 min daily "on lowest" and not sob, not using albuterol sets 3lpm  Doe x Walking parking lot but not checking 02/ does fine pushing cart in stores Cough: none now Sleeping: bed blocks/ 2 pillows SABA use: just p ex eg walking to mb and back  02: 2lpm hs and up to 3lpm with ex but really not titrating  Covid status: vax x 5       No obvious day to day or daytime variability or assoc excess/ purulent sputum or mucus plugs or hemoptysis or cp or chest tightness, subjective wheeze or overt sinus or hb symptoms.   Sleeping as above  without nocturnal  or early am exacerbation  of respiratory  c/o's or need for noct saba. Also denies any obvious fluctuation of symptoms with weather or environmental changes or other aggravating or alleviating factors except as outlined above   No unusual exposure hx or h/o childhood pna/ asthma or knowledge of premature birth.  Current Allergies, Complete Past Medical History, Past Surgical History, Family History, and Social History were reviewed in [employer] record.  ROS  The following are not active complaints unless bolded Hoarseness, sore throat, dysphagia, dental problems, itching, sneezing,  nasal congestion or discharge of excess mucus or purulent secretions, ear ache,   fever, chills, sweats, unintended wt loss or wt gain, classically pleuritic or exertional cp,  orthopnea pnd or arm/hand swelling  or leg swelling, presyncope, palpitations, abdominal pain, anorexia, nausea, vomiting, diarrhea  or change in bowel habits or change in bladder habits, change in stools or change in urine, dysuria, hematuria,  rash, arthralgias, visual complaints, headache, numbness, weakness or ataxia or problems with walking or coordination,  change in mood or  memory.        Current Meds  Medication Sig   famotidine (PEPCID) 20 MG tablet TAKE 1 TABLET BY MOUTH  TWICE DAILY AFTER BREAKFAST AND AFTER SUPPER   fluticasone (FLONASE) 50 MCG/ACT nasal spray USE 2 SPRAYS IN BOTH  NOSTRILS DAILY   hydrochlorothiazide (HYDRODIURIL) 25 MG tablet TAKE 1 TABLET BY MOUTH  DAILY   hydroquinone 4 % cream Apply 1 application topically daily.   loratadine (CLARITIN) 10 MG tablet Take 1 tablet (10 mg total) by mouth daily.   Multiple Vitamins-Minerals (WOMENS 50+ MULTI VITAMIN/MIN) TABS Take 1  tablet by mouth daily.   OXYGEN Inhale 2-3 L into the lungs See admin instructions. 2lpm with sleep and 3lpm with exertion   Polyvinyl Alcohol-Povidone PF (REFRESH) 1.4-0.6 % SOLN Place 1 drop into both eyes daily.   potassium chloride SA (KLOR-CON M) 20 MEQ tablet TAKE 1 TABLET BY MOUTH  DAILY   PROAIR HFA 108 (90 Base) MCG/ACT inhaler Inhale 2 puffs  into the lungs every 4 (four) hours as needed for wheezing or shortness of breath.   Respiratory Therapy Supplies (FLUTTER) DEVI Use 3 times a day as needed for congestion.   Tiotropium Bromide-Olodaterol (STIOLTO RESPIMAT) 2.5-2.5 MCG/ACT AERS Inhale 2 puffs into the lungs daily.                             Objective:   Physical Exam   Wt  10/22/2021           182  07/09/2021         185  03/20/2021           178 07/25/2019         201  10/20/2018          189  09/03/2018          186  08/02/2018        189   01/29/2018         185   12/18/17 183 lb (83 kg)  11/18/17 183 lb (83 kg)  10/12/17 183 lb (83 kg)     Vital signs reviewed  10/22/2021  - Note at rest 02 sats  100% on 3lpm POC   General appearance:    amb bf easily confused with details of care    HEENT : pt wearing mask not removed for exam due to covid - 19 concerns.   NECK :  without JVD/Nodes/TM/ nl carotid upstrokes bilaterally   LUNGS: no acc muscle use,  Min barrel  contour chest wall with bilateral  slightly decreased bs s audible wheeze and  without cough on insp or exp maneuvers and min  Hyperresonant  to  percussion bilaterally     CV:  RRR  no s3 or murmur or increase in P2, and no edema   ABD:  soft and nontender with pos end  insp Hoover's  in the supine position. No bruits or organomegaly appreciated, bowel sounds nl  MS:   Nl gait/  ext warm without deformities, calf tenderness, cyanosis or clubbing No obvious joint restrictions   SKIN: warm and dry without lesions    NEURO:  alert, approp, nl sensorium with  no motor or cerebellar deficits  apparent.        Assessment:

## 2021-11-07 ENCOUNTER — Telehealth: Payer: Self-pay | Admitting: *Deleted

## 2021-11-07 ENCOUNTER — Encounter: Payer: Self-pay | Admitting: Podiatry

## 2021-11-07 ENCOUNTER — Other Ambulatory Visit: Payer: Self-pay

## 2021-11-07 ENCOUNTER — Ambulatory Visit (INDEPENDENT_AMBULATORY_CARE_PROVIDER_SITE_OTHER): Payer: Medicare Other | Admitting: Podiatry

## 2021-11-07 DIAGNOSIS — G5793 Unspecified mononeuropathy of bilateral lower limbs: Secondary | ICD-10-CM | POA: Diagnosis not present

## 2021-11-07 MED ORDER — GABAPENTIN 100 MG PO CAPS: 100.0000 mg | ORAL_CAPSULE | Freq: Every day | ORAL | 3 refills | Status: DC

## 2021-11-07 NOTE — Progress Notes (Signed)
She presents today for follow-up she states that her left foot is starting to bother her more as far as coldness and numbness she says it has really increased over the past for 5 months she states it is hard to sleep at night and she has had numbness and tingling even during the daytime she notices it.  She states that she has had lots of swelling before and she wore compression hose but they are just too tight and just does not like to wear them.  She is also noted to knots in her skin in her upper leg. ? ?Objective: Vital signs are stable she is alert and oriented x3 pulses are palpable bilateral.  Capillary fill time is immediate she does have lipomas to the anterior lateral ankle and posterior medial ankle.  More noticeable in the left foot than the right she has no open lesions or wounds no calluses or corns.  She does have palpable nodules of her skin most likely cutis calcinosis or even a phlebolith. ? ?Assessment: Neuropathy with edema.  Venous insufficiency. ? ?Plan: At this point I started her on gabapentin 100 mg 1 p.o. nightly I will follow-up with her in 6 weeks for med check. ?

## 2021-11-07 NOTE — Telephone Encounter (Signed)
Patient was seen a year ago, symptoms have worsen , feet are more swollen, cold, pins/needles feeling, 2 knots on side of left leg. Please schedule for an appointment with physician as soon as possible. ?

## 2021-11-28 ENCOUNTER — Other Ambulatory Visit: Payer: Self-pay | Admitting: Family Medicine

## 2021-11-28 DIAGNOSIS — I10 Essential (primary) hypertension: Secondary | ICD-10-CM

## 2021-11-28 NOTE — Telephone Encounter (Signed)
Requested Prescriptions  ?Pending Prescriptions Disp Refills  ?? potassium chloride SA (KLOR-CON M) 20 MEQ tablet [Pharmacy Med Name: Potassium Chloride Crys ER 20 MEQ Oral Tablet Extended Release] 90 tablet 3  ?  Sig: TAKE 1 TABLET BY MOUTH DAILY  ?  ? Endocrinology:  Minerals - Potassium Supplementation Passed - 11/28/2021  7:26 AM  ?  ?  Passed - K in normal range and within 360 days  ?  Potassium  ?Date Value Ref Range Status  ?06/26/2021 4.0 3.5 - 5.2 mmol/L Final  ?   ?  ?  Passed - Cr in normal range and within 360 days  ?  Creat  ?Date Value Ref Range Status  ?06/17/2016 0.88 0.50 - 0.99 mg/dL Final  ?  Comment:  ?    ?For patients > or = 74 years of age: The upper reference limit for ?Creatinine is approximately 13% higher for people identified as ?African-American. ?  ?  ? ?Creatinine, Ser  ?Date Value Ref Range Status  ?06/26/2021 0.91 0.57 - 1.00 mg/dL Final  ?   ?  ?  Passed - Valid encounter within last 12 months  ?  Recent Outpatient Visits   ?      ? 5 months ago Essential hypertension  ? Primary Care at [facility], MD  ? 8 months ago Essential hypertension  ? Primary Care at [facility], [place], NP  ? 1 year ago Encounter to establish care  ? Primary Care at [facility], Bayard Beaver, MD  ? 1 year ago Essential hypertension  ? [facility] Care at [facility], Delfino Lovett, NP  ? 2 years ago Essential hypertension  ? Primary Care at [facility], Arlie Solomons, MD  ?  ?  ?Future Appointments   ?        ? In 3 weeks Dorna Mai, MD Primary Care at [facility]  ?  ? ?  ?  ?  ?? hydrochlorothiazide (HYDRODIURIL) 25 MG tablet [Pharmacy Med Name: hydroCHLOROthiazide 25 MG Oral Tablet] 90 tablet 3  ?  Sig: TAKE 1 TABLET BY MOUTH DAILY  ?  ? Cardiovascular: Diuretics - Thiazide Passed - 11/28/2021  7:26 AM  ?  ?  Passed - Cr in normal range and within 180 days  ?  Creat  ?Date Value Ref Range Status  ?06/17/2016 0.88 0.50 - 0.99 mg/dL Final  ?  Comment:  ?    ?For  patients > or = 74 years of age: The upper reference limit for ?Creatinine is approximately 13% higher for people identified as ?African-American. ?  ?  ? ?Creatinine, Ser  ?Date Value Ref Range Status  ?06/26/2021 0.91 0.57 - 1.00 mg/dL Final  ?   ?  ?  Passed - K in normal range and within 180 days  ?  Potassium  ?Date Value Ref Range Status  ?06/26/2021 4.0 3.5 - 5.2 mmol/L Final  ?   ?  ?  Passed - Na in normal range and within 180 days  ?  Sodium  ?Date Value Ref Range Status  ?06/26/2021 141 134 - 144 mmol/L Final  ?   ?  ?  Passed - Last BP in normal range  ?  BP Readings from Last 1 Encounters:  ?10/22/21 122/80  ?   ?  ?  Passed - Valid encounter within last 6 months  ?  Recent Outpatient Visits   ?      ? 5 months ago  Essential hypertension  ? Primary Care at [facility], MD  ? 8 months ago Essential hypertension  ? Primary Care at [facility], [place], NP  ? 1 year ago Encounter to establish care  ? Primary Care at [facility], Bayard Beaver, MD  ? 1 year ago Essential hypertension  ? [facility] at [facility], Delfino Lovett, NP  ? 2 years ago Essential hypertension  ? Primary Care at [facility], Arlie Solomons, MD  ?  ?  ?Future Appointments   ?        ? In 3 weeks Dorna Mai, MD Primary Care at [facility]  ?  ? ?  ?  ?  ? ? ?

## 2021-12-12 DIAGNOSIS — Z20822 Contact with and (suspected) exposure to covid-19: Secondary | ICD-10-CM | POA: Diagnosis not present

## 2021-12-17 ENCOUNTER — Ambulatory Visit (INDEPENDENT_AMBULATORY_CARE_PROVIDER_SITE_OTHER): Payer: Medicare Other | Admitting: Podiatry

## 2021-12-17 DIAGNOSIS — G5793 Unspecified mononeuropathy of bilateral lower limbs: Secondary | ICD-10-CM

## 2021-12-17 MED ORDER — GABAPENTIN 100 MG PO CAPS: 100.0000 mg | ORAL_CAPSULE | Freq: Three times a day (TID) | ORAL | 3 refills | Status: DC

## 2021-12-17 NOTE — Progress Notes (Signed)
She presents today for neuropathy follow-up states that there is improvement though still has some swelling and numbness.  She states that getting some of the numbness throughout the day but this seems to be helping at nighttime. ? ?Objective: Vital signs are stable she is alert and oriented x3.  Pulses are palpable.  She has no real change in physical exam. ? ?Assessment: Neuropathy. ? ?Plan: At this point we will increase her gabapentin from just 1 at nighttime to 1 3 times a day should this not work well for her she will notify us.  Otherwise I will see her in a couple of months for med check. ?

## 2021-12-24 ENCOUNTER — Ambulatory Visit (INDEPENDENT_AMBULATORY_CARE_PROVIDER_SITE_OTHER): Payer: Medicare Other | Admitting: Family Medicine

## 2021-12-24 ENCOUNTER — Encounter: Payer: Self-pay | Admitting: Family Medicine

## 2021-12-24 VITALS — BP 129/75 | HR 83 | Temp 98.0°F | Resp 16 | Wt 178.8 lb

## 2021-12-24 DIAGNOSIS — J9611 Chronic respiratory failure with hypoxia: Secondary | ICD-10-CM | POA: Diagnosis not present

## 2021-12-24 DIAGNOSIS — Z780 Asymptomatic menopausal state: Secondary | ICD-10-CM | POA: Diagnosis not present

## 2021-12-24 DIAGNOSIS — R7303 Prediabetes: Secondary | ICD-10-CM

## 2021-12-24 DIAGNOSIS — Z13228 Encounter for screening for other metabolic disorders: Secondary | ICD-10-CM | POA: Diagnosis not present

## 2021-12-24 DIAGNOSIS — Z0000 Encounter for general adult medical examination without abnormal findings: Secondary | ICD-10-CM

## 2021-12-24 DIAGNOSIS — Z1329 Encounter for screening for other suspected endocrine disorder: Secondary | ICD-10-CM | POA: Diagnosis not present

## 2021-12-24 DIAGNOSIS — Z0001 Encounter for general adult medical examination with abnormal findings: Secondary | ICD-10-CM | POA: Diagnosis not present

## 2021-12-24 DIAGNOSIS — Z13220 Encounter for screening for lipoid disorders: Secondary | ICD-10-CM

## 2021-12-24 DIAGNOSIS — Z130 Encounter for screening for diseases of the blood and blood-forming organs and certain disorders involving the immune mechanism: Secondary | ICD-10-CM | POA: Diagnosis not present

## 2021-12-24 DIAGNOSIS — Z13820 Encounter for screening for osteoporosis: Secondary | ICD-10-CM
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
## 2021-12-24 NOTE — Telephone Encounter (Signed)
error 

## 2021-12-24 NOTE — Progress Notes (Signed)
Patient is here for her complete physical examination. Patient said that she did a 14 day cleanse and have never felt better. ? ?

## 2021-12-25 ENCOUNTER — Telehealth: Payer: Self-pay | Admitting: Family Medicine

## 2021-12-25 LAB — HEMOGLOBIN A1C
Est. average glucose Bld gHb Est-mCnc: 134 mg/dL
Hgb A1c MFr Bld: 6.3 % — ABNORMAL HIGH (ref 4.8–5.6)

## 2021-12-25 LAB — CBC WITH DIFFERENTIAL/PLATELET
Basophils Absolute: 0 10*3/uL (ref 0.0–0.2)
Basos: 1 %
EOS (ABSOLUTE): 0.3 10*3/uL (ref 0.0–0.4)
Eos: 4 %
Hematocrit: 44.3 % (ref 34.0–46.6)
Hemoglobin: 14.6 g/dL (ref 11.1–15.9)
Immature Grans (Abs): 0 10*3/uL (ref 0.0–0.1)
Immature Granulocytes: 0 %
Lymphocytes Absolute: 2.4 10*3/uL (ref 0.7–3.1)
Lymphs: 35 %
MCH: 29.9 pg (ref 26.6–33.0)
MCHC: 33 g/dL (ref 31.5–35.7)
MCV: 91 fL (ref 79–97)
Monocytes Absolute: 0.5 10*3/uL (ref 0.1–0.9)
Monocytes: 7 %
Neutrophils Absolute: 3.7 10*3/uL (ref 1.4–7.0)
Neutrophils: 53 %
Platelets: 253 10*3/uL (ref 150–450)
RBC: 4.88 x10E6/uL (ref 3.77–5.28)
RDW: 13.5 % (ref 11.7–15.4)
WBC: 6.9 10*3/uL (ref 3.4–10.8)

## 2021-12-25 LAB — CMP14+EGFR
ALT: 12 IU/L (ref 0–32)
AST: 20 IU/L (ref 0–40)
Albumin/Globulin Ratio: 1.2 (ref 1.2–2.2)
Albumin: 4.1 g/dL (ref 3.7–4.7)
Alkaline Phosphatase: 104 IU/L (ref 44–121)
BUN/Creatinine Ratio: 9 — ABNORMAL LOW (ref 12–28)
BUN: 9 mg/dL (ref 8–27)
Bilirubin Total: 0.4 mg/dL (ref 0.0–1.2)
CO2: 30 mmol/L — ABNORMAL HIGH (ref 20–29)
Calcium: 10.2 mg/dL (ref 8.7–10.3)
Chloride: 100 mmol/L (ref 96–106)
Creatinine, Ser: 1.03 mg/dL — ABNORMAL HIGH (ref 0.57–1.00)
Globulin, Total: 3.4 g/dL (ref 1.5–4.5)
Glucose: 86 mg/dL (ref 70–99)
Potassium: 3.9 mmol/L (ref 3.5–5.2)
Sodium: 142 mmol/L (ref 134–144)
Total Protein: 7.5 g/dL (ref 6.0–8.5)
eGFR: 57 mL/min/{1.73_m2} — ABNORMAL LOW (ref >59)

## 2021-12-25 LAB — MICROALBUMIN / CREATININE URINE RATIO
Creatinine, Urine: 72.6 mg/dL
Microalb/Creat Ratio: <4 mg/g creat (ref 0–29)
Microalbumin, Urine: <3 ug/mL

## 2021-12-25 LAB — LIPID PANEL
Chol/HDL Ratio: 2.8 ratio (ref 0.0–4.4)
Cholesterol, Total: 181 mg/dL (ref 100–199)
HDL: 64 mg/dL (ref >39)
LDL Chol Calc (NIH): 96 mg/dL (ref 0–99)
Triglycerides: 120 mg/dL (ref 0–149)
VLDL Cholesterol Cal: 21 mg/dL (ref 5–40)

## 2021-12-25 LAB — TSH: TSH: 0.84 u[IU]/mL (ref 0.450–4.500)

## 2021-12-25 NOTE — Telephone Encounter (Signed)
Pt called -stated returning Nurse Audrey's call? Please advise and thank you ?

## 2021-12-26 NOTE — Telephone Encounter (Signed)
Patient is aware of lab results with no questions ?

## 2021-12-27 NOTE — Progress Notes (Signed)
There is no significant amount of protein in the urine. Recheck routinely.  ?

## 2022-01-14 ENCOUNTER — Other Ambulatory Visit: Payer: Medicare Other

## 2022-01-27 ENCOUNTER — Other Ambulatory Visit: Payer: Self-pay | Admitting: Family Medicine

## 2022-01-27 DIAGNOSIS — Z13820 Encounter for screening for osteoporosis: Secondary | ICD-10-CM

## 2022-01-28 ENCOUNTER — Ambulatory Visit
Admission: RE | Admit: 2022-01-28 | Discharge: 2022-01-28 | Disposition: A | Discharge status: Home / Self Care | Payer: Medicare Other | Source: Ambulatory Visit | Attending: Family Medicine | Admitting: Family Medicine

## 2022-01-28 DIAGNOSIS — Z780 Asymptomatic menopausal state: Secondary | ICD-10-CM | POA: Diagnosis not present

## 2022-01-28 DIAGNOSIS — Z13820 Encounter for screening for osteoporosis: Secondary | ICD-10-CM

## 2022-02-10 DIAGNOSIS — Z23 Encounter for immunization: Secondary | ICD-10-CM | POA: Diagnosis not present

## 2022-02-18 ENCOUNTER — Encounter: Payer: Self-pay | Admitting: Podiatry

## 2022-02-18 ENCOUNTER — Ambulatory Visit (INDEPENDENT_AMBULATORY_CARE_PROVIDER_SITE_OTHER): Payer: Medicare Other | Admitting: Podiatry

## 2022-02-18 DIAGNOSIS — G5793 Unspecified mononeuropathy of bilateral lower limbs: Secondary | ICD-10-CM

## 2022-02-18 NOTE — Progress Notes (Signed)
She presents today for follow-up of her gabapentin.  She states that is doing a lot better she states that I usually get at least 2 pills in a day cannot always get the third 1 and based on timing or sometimes I just forget.  She does relate that she is 90% improved even with the 2 pills.  States that she does have some pain across the top of her foot that sort of radiates up in her ankle as she points to the deep peroneal nerve between the medial and intermediate cuneiform area.  Objective: Vital signs are stable she is alert and oriented x3.  Pulses are palpable bilateral.  Feet are warm to the touch no open lesions or wounds.  No change in neurological evaluation.  Right foot does demonstrate some localized tenderness unable to reproduce any radiating pain today on palpation of the right foot.  No swelling no signs of infection.  Assessment: Neuritis possibly associated trauma dorsal aspect right foot.  Idiopathic neuropathy both feet.  Currently controlled by gabapentin 100 mg 3 times daily.

## 2022-03-10 ENCOUNTER — Other Ambulatory Visit: Payer: Self-pay | Admitting: Internal Medicine

## 2022-03-12 ENCOUNTER — Telehealth: Payer: Self-pay | Admitting: Family Medicine

## 2022-03-12 NOTE — Telephone Encounter (Signed)
Copied from [place] [id number]. Topic: General - Inquiry >> Mar 12, 2022  1:24 PM Marcellus Scott wrote: Reason for CRM: Pt requesting Jury Duty Excuse pt stated that between oxygen and neuropathy, she cannot sit there.  Pt stated Jury duty is 03/17/2022. >> Mar 12, 2022  1:36 PM Chapman Fitch wrote: Pt received a letter in her mail today and no longer needs the jury duty excuse letter from the office

## 2022-04-03 ENCOUNTER — Other Ambulatory Visit: Payer: Self-pay | Admitting: Family Medicine

## 2022-04-03 DIAGNOSIS — Z1231 Encounter for screening mammogram for malignant neoplasm of breast: Secondary | ICD-10-CM

## 2022-04-04 DIAGNOSIS — Z23 Encounter for immunization: Secondary | ICD-10-CM | POA: Diagnosis not present

## 2022-05-19 ENCOUNTER — Ambulatory Visit
Admission: RE | Admit: 2022-05-19 | Discharge: 2022-05-19 | Disposition: A | Discharge status: Home / Self Care | Payer: Medicare Other | Source: Ambulatory Visit | Attending: Family Medicine | Admitting: Family Medicine

## 2022-05-19 DIAGNOSIS — Z1231 Encounter for screening mammogram for malignant neoplasm of breast: Secondary | ICD-10-CM

## 2022-05-29 DIAGNOSIS — Z23 Encounter for immunization: Secondary | ICD-10-CM | POA: Diagnosis not present

## 2022-07-02 DIAGNOSIS — H26492 Other secondary cataract, left eye: Secondary | ICD-10-CM | POA: Diagnosis not present

## 2022-07-02 DIAGNOSIS — H35373 Puckering of macula, bilateral: Secondary | ICD-10-CM | POA: Diagnosis not present

## 2022-07-02 DIAGNOSIS — E119 Type 2 diabetes mellitus without complications: Secondary | ICD-10-CM | POA: Diagnosis not present

## 2022-07-02 DIAGNOSIS — H04123 Dry eye syndrome of bilateral lacrimal glands: Secondary | ICD-10-CM | POA: Diagnosis not present

## 2022-07-22 ENCOUNTER — Telehealth: Payer: Self-pay | Admitting: Internal Medicine

## 2022-07-22 NOTE — Telephone Encounter (Signed)
Spoke to pt about Stiolto samples. I informed pt that unfortunately we do not have samples at this time. And will put her name on a list to hold a sample for her when we do get some. Pt verbalized understanding, nothing further needed.

## 2022-07-28 ENCOUNTER — Other Ambulatory Visit: Payer: Self-pay | Admitting: Family Medicine

## 2022-07-28 DIAGNOSIS — R0981 Nasal congestion: Secondary | ICD-10-CM

## 2022-09-02 ENCOUNTER — Encounter: Payer: Self-pay | Admitting: Family Medicine

## 2022-09-02 ENCOUNTER — Ambulatory Visit (INDEPENDENT_AMBULATORY_CARE_PROVIDER_SITE_OTHER): Payer: Medicare Other | Admitting: Family Medicine

## 2022-09-02 VITALS — BP 107/70 | HR 90 | Temp 98.1°F | Resp 16

## 2022-09-02 DIAGNOSIS — Z0000 Encounter for general adult medical examination without abnormal findings: Secondary | ICD-10-CM | POA: Diagnosis not present
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
## 2022-09-28 ENCOUNTER — Other Ambulatory Visit: Payer: Self-pay | Admitting: Internal Medicine

## 2022-11-07 DIAGNOSIS — Z23 Encounter for immunization: Secondary | ICD-10-CM | POA: Diagnosis not present

## 2023-01-27 ENCOUNTER — Other Ambulatory Visit: Payer: Self-pay | Admitting: Family Medicine

## 2023-01-27 DIAGNOSIS — I10 Essential (primary) hypertension: Secondary | ICD-10-CM

## 2023-02-10 ENCOUNTER — Ambulatory Visit: Payer: Medicare Other | Admitting: Family Medicine

## 2023-03-17 ENCOUNTER — Other Ambulatory Visit: Payer: Self-pay | Admitting: Family Medicine

## 2023-03-17 ENCOUNTER — Other Ambulatory Visit: Payer: Self-pay | Admitting: Internal Medicine

## 2023-03-17 DIAGNOSIS — R0981 Nasal congestion: Secondary | ICD-10-CM

## 2023-03-30 ENCOUNTER — Other Ambulatory Visit: Payer: Self-pay | Admitting: Podiatry

## 2023-04-10 ENCOUNTER — Other Ambulatory Visit: Payer: Self-pay | Admitting: Podiatry

## 2023-04-13 ENCOUNTER — Other Ambulatory Visit: Payer: Self-pay | Admitting: Podiatry

## 2023-04-14 ENCOUNTER — Other Ambulatory Visit: Payer: Self-pay | Admitting: Family Medicine

## 2023-04-14 DIAGNOSIS — Z1231 Encounter for screening mammogram for malignant neoplasm of breast: Secondary | ICD-10-CM

## 2023-05-18 ENCOUNTER — Other Ambulatory Visit: Payer: Self-pay | Admitting: Internal Medicine

## 2023-05-22 ENCOUNTER — Ambulatory Visit: Payer: Medicare Other

## 2023-06-18 DIAGNOSIS — Z23 Encounter for immunization: Secondary | ICD-10-CM | POA: Diagnosis not present

## 2023-06-20 ENCOUNTER — Other Ambulatory Visit: Payer: Self-pay | Admitting: Internal Medicine

## 2023-06-23 ENCOUNTER — Ambulatory Visit
Admission: RE | Admit: 2023-06-23 | Discharge: 2023-06-23 | Disposition: A | Discharge status: Home / Self Care | Payer: Medicare Other | Source: Ambulatory Visit | Attending: Family Medicine | Admitting: Family Medicine

## 2023-06-23 DIAGNOSIS — Z1231 Encounter for screening mammogram for malignant neoplasm of breast: Secondary | ICD-10-CM

## 2023-06-26 ENCOUNTER — Encounter: Payer: Self-pay | Admitting: Internal Medicine

## 2023-06-26 ENCOUNTER — Ambulatory Visit (INDEPENDENT_AMBULATORY_CARE_PROVIDER_SITE_OTHER): Payer: Medicare Other | Admitting: Internal Medicine

## 2023-06-26 VITALS — BP 118/62 | HR 90 | Temp 98.5°F | Ht 65.0 in | Wt 173.0 lb

## 2023-06-26 DIAGNOSIS — J9611 Chronic respiratory failure with hypoxia: Secondary | ICD-10-CM | POA: Diagnosis not present

## 2023-06-26 DIAGNOSIS — J449 Chronic obstructive pulmonary disease, unspecified: Secondary | ICD-10-CM | POA: Diagnosis not present

## 2023-06-26 DIAGNOSIS — Z87891 Personal history of nicotine dependence: Secondary | ICD-10-CM | POA: Insufficient documentation

## 2023-06-26 MED ORDER — SPIRIVA RESPIMAT 2.5 MCG/ACT IN AERS: 2.0000 | INHALATION_SPRAY | Freq: Every day | RESPIRATORY_TRACT | Status: DC

## 2023-06-26 NOTE — Assessment & Plan Note (Signed)
Referred  for LDSCT  06/26/2023 >>>   Low-dose CT lung cancer screening is recommended for patients who are 56-74 years of age with a 20+ pack-year history of smoking and who are currently smoking or quit <=15 years ago. No coughing up blood  No unintentional weight loss of > 15 pounds in the last 6 months - pt is eligible for scanning yearly until 2030 > referred today          Each maintenance medication was reviewed in detail including emphasizing most importantly the difference between maintenance and prns and under what circumstances the prns are to be triggered using an action plan format where appropriate.  Total time for H and P, chart review, counseling, reviewing smi/ 02 /pulse ox  device(s) and generating customized AVS unique to this office visit / same day charting = 32 min

## 2023-06-26 NOTE — Assessment & Plan Note (Signed)
08/02/2018   Room Air at Rest = 93%  >> Room Air while Ambulating = 83% Patient Saturations on 2 Liters of oxygen while Ambulating = 94%>  rx 2lpm with activity  - placed on 24 h 02 at d/c from [facility] p dx pna 08/23/18 - refer for BEST fit for amb 02  09/03/2018  - 09/03/2018   Walked RA x  X 334ft - stopped due to  Sob and desat to 87% corrected on 2lpm at avg pace so rec 02 2lpm hs and with exertion and referred to dme for BEST fit for amb 02 > not done - . 03/20/2021   Walked RA  2 laps @ approx 267ft each @ avg pace  stopped due to desats to 85% corrected on 3lpm POC   Again advised: 2lpm hs Make sure you check your oxygen saturation  AT  your highest level of activity (not after you stop)   to be sure it stays over 90% and adjust  02 flow upward to maintain this level if needed but remember to turn it back to previous settings when you stop (to conserve your supply).

## 2023-06-26 NOTE — Assessment & Plan Note (Signed)
Quit smoking 2015 - 12/18/2017  Walked RA x 3 laps @ 185 ft each stopped due to  End of study, fast pace, sats 90% at end  -  Spirometry 12/18/2017  FEV1 1.63 (83%)  Ratio 65 - 12/18/2017  > changed from  symbicort /spiriva dpi to stiolto - PFT's  01/29/2018  FEV1 1.81  (87 % ) ratio 67  p 16 % improvement from saba p nothing prior to study with DLCO  32 % corrects to 46  % for alv volume   - 09/03/2018  After extensive coaching inhaler device,  effectiveness =    90% with smi  - starting rehab 10/21/18 > did x 3 weeks prior to covid then stopped  - CT chest 01/26/21 c/w emphysema plus ? ILD  - PFT's  05/02/21  FEV1 1.77 (88 % ) ratio 0.70 ( 66% if do fev1/vc)   p stiolto prior to study with DLCO  8.67 (41%) corrects to 2.37 (57%)  for alv volume and FV curve min curvature   - Spirometry  05/02/21   FEV1 2.10 (104%)  Ratio 0.83 min curvature p stiolto prior (study says it was symbicort but she assured it was stiolto as never had symbicort )  - 07/09/2021    changed to dulera 100  - 09/13/2021 req change back to stiolto as saba use much higher on dulera  - 06/26/2023  After extensive coaching inhaler device,  effectiveness =    90% SMI so try stiloto 1 pff each am with 2nd puff optional for cost savings while in donut hole   Pt is Group B in terms of symptom/risk and laba/lama therefore appropriate rx at this point >>>  stiolto and prn saba   Re SABA :  I spent extra time with pt today reviewing appropriate use of albuterol for prn use on exertion with the following points: 1) saba is for relief of sob that does not improve by walking a slower pace or resting but rather if the pt does not improve after trying this first. 2) If the pt is convinced, as many are, that saba helps recover from activity faster then it's easy to tell if this is the case by re-challenging : ie stop, take the inhaler, then p 5 minutes try the exact same activity (intensity of workload) that just caused the symptoms and see if they are  substantially diminished or not after saba 3) if there is an activity that reproducibly causes the symptoms, try the saba 15 min before the activity on alternate days   If in fact the saba really does help, then fine to continue to use it prn but advised may need to look closer at the maintenance regimen being used to achieve better control of airways disease with exertion.

## 2023-06-26 NOTE — Progress Notes (Signed)
Subjective:    Patient ID: Nicole Spence, female   DOB: 1946/04/06,    MRN: [id number]    Brief patient profile:  77 yobf  Quit smoking 2015 noted onset of doe age 77 (1999)  44 with sense of nasal drainage/ sore throat and eventually placed on spiriva then added symbicort but actually much better since started reflux meds early April 2019 and referred to pulmonary clinic 12/18/2017 by Dr   Nilda Simmer p ent eval around 2017 pos gerd but had stopped PPI due to reported potential side effects in media .  Spirometry  05/02/21   FEV1 2.10 (104%)  Ratio 0.83 min curvature p stiolto    History of Present Illness  12/18/2017 1st [facility] office visit/ Nadiah Corbit   Chief Complaint  Patient presents with   Pulmonary Consult    Referred by Dr. Clarisa Kindred for eval of COPD. Pt states she was told years ago she had chronic bronchitis "but it never really bothered me". She has SOB with exertion such as cleaning her house or washing her car. She has an albuterol inhaler that she rarely uses.   doe indolent onset minimal progressive since age 77  Esp carrying groceries in from car has trouble with steps and walks slower than avg people never tries saba [facility] = can't walk a nl pace on a flat grade s sob but does fine slow and flat  Takes  symbicort x 2 first thing am/ then spriva lunchtime and then symb 160   Cough better on otc gerd rx  Sleep ok  rec Stop spiriva and symbicort  Plan A = Automatic = stiolto 2 pffs each am  And continue the omeprazole Take 30-60 min before first meal of the day  Work on inhaler technique:   Plan B = Backup Only use your albuterol as a rescue medication   GERD diet     07/09/2021  f/u ov/Minie Roadcap re: Actually  GOLD 0/02 dep with ex    maint on stiiolto daily   Chief Complaint  Patient presents with   Follow-up    Recent sinus infection for 3 weeks.  Bloody nose, some yellow.  No cough.  Drainage in throat.   Dyspnea:  eliptical x 30  min s rescue on 3lpm   Cough: still in am /worse x 3 weeks in setting of nasal congestion/ epistaxis with some purulent nasal discharge SABA use: not really understanding how/ whn to use  02: 2lpm and none at rest and up to 3lpm but not checking sats  Covid status:   vax x 4  Rec Make sure you check your oxygen saturation  AT  your highest level of activity (not after you stop)   to be sure it stays over 90% Plan A = Automatic = Always=    Dulera 100 Take 2 puffs first thing in am and then another 2 puffs about 12 hours later.  Work on inhaler technique:  Plan B = Backup (to supplement plan A, not to replace it) Only use your albuterol inhaler as a rescue medication  Ok to try albuterol 15 min before an activity (on alternating days)  that you know would usually make you short of breath  Augmentin 875 mg take one pill twice daily  X 10 day    10/22/2021  f/u ov/Radford Pease re: GOLD 0 copdAB    maint on stiolto  and 02 hs, not using exertion  Chief Complaint  Patient presents with   Follow-up  Follow up. Patient says everything is going a lot better.   Dyspnea:  eliptical x  15 min daily "on lowest" and not sob, not using albuterol sets 3lpm  Doe x Walking parking lot but not checking 02/ does fine pushing cart in stores Cough: none now Sleeping: bed blocks/ 2 pillows SABA use: just p ex eg walking to mb and back  02: 2lpm hs and up to 3lpm with ex but really not titrating  Covid status: vax x 5   Rec Make sure you check your oxygen saturation  AT  your highest level of activity (not after you stop)   to be sure it stays over 90%  Ok to try albuterol 15 min before an activity (on alternating days like starter fluid)  that you know would usually make you short of breath       06/26/2023  f/u ov/Jerzey Komperda re: GOLD 0 copd/AB 02 dep   maint on stiolto 2 each am but having trouble paying for it   Chief Complaint  Patient presents with   Follow-up    Possible sinus infection.  Nasal congestion with some bloody nasal  drainage.  Needs refill on Stiolto   Dyspnea:  doing eliptical  15 min / lowest resistance   2lpm - does MB and back but not monitoring or titrating 02   Cough: none  Sleeping: bed blocks, 2 pillows and no resp cc  SABA use: rarely  02: 2lpm hs  and prn POC  up to 3lpm      No obvious day to day or daytime variability or assoc excess/ purulent sputum or mucus plugs or hemoptysis or cp or chest tightness, subjective wheeze or overt sinus or hb symptoms.    Also denies any obvious fluctuation of symptoms with weather or environmental changes or other aggravating or alleviating factors except as outlined above   No unusual exposure hx or h/o childhood pna/ asthma or knowledge of premature birth.  Current Allergies, Complete Past Medical History, Past Surgical History, Family History, and Social History were reviewed in [employer] record.  ROS  The following are not active complaints unless bolded Hoarseness, sore throat, dysphagia, dental problems, itching, sneezing,  nasal congestion or discharge of excess mucus or purulent secretions, ear ache,   fever, chills, sweats, unintended wt loss or wt gain, classically pleuritic or exertional cp,  orthopnea pnd or arm/hand swelling  or leg swelling, presyncope, palpitations, abdominal pain, anorexia, nausea, vomiting, diarrhea  or change in bowel habits or change in bladder habits, change in stools or change in urine, dysuria, hematuria,  rash, arthralgias, visual complaints, headache, numbness, weakness or ataxia or problems with walking or coordination,  change in mood or  memory.        Current Meds  Medication Sig   famotidine (PEPCID) 20 MG tablet TAKE 1 TABLET BY MOUTH TWICE  DAILY AFTER BREAKFAST AND AFTER  SUPPER   fluticasone (FLONASE) 50 MCG/ACT nasal spray USE 2 SPRAYS IN BOTH NOSTRILS  DAILY   gabapentin (NEURONTIN) 100 MG capsule Take 1 capsule (100 mg total) by mouth 3 (three) times daily.   hydrochlorothiazide  (HYDRODIURIL) 25 MG tablet TAKE 1 TABLET BY MOUTH DAILY   hydroquinone 4 % cream Apply 1 application topically daily.   loratadine (CLARITIN) 10 MG tablet Take 1 tablet (10 mg total) by mouth daily.   Multiple Vitamins-Minerals (WOMENS 50+ MULTI VITAMIN/MIN) TABS Take 1 tablet by mouth daily.   OXYGEN Inhale 2-3 L into  the lungs See admin instructions. 2lpm with sleep and 3lpm with exertion   Polyvinyl Alcohol-Povidone PF (REFRESH) 1.4-0.6 % SOLN Place 1 drop into both eyes daily.   potassium chloride SA (KLOR-CON M) 20 MEQ tablet TAKE 1 TABLET BY MOUTH DAILY   PROAIR HFA 108 (90 Base) MCG/ACT inhaler Inhale 2 puffs into the lungs every 4 (four) hours as needed for wheezing or shortness of breath.   Respiratory Therapy Supplies (FLUTTER) DEVI Use 3 times a day as needed for congestion.   Tiotropium Bromide-Olodaterol (STIOLTO RESPIMAT) 2.5-2.5 MCG/ACT AERS Inhale 2 puffs into the lungs daily. **NEEDS OV FOR FURTHER REFILLS**                               Objective:   Physical Exam   Wt  06/26/2023           173  10/22/2021           182  07/09/2021         185  03/20/2021           178 07/25/2019         201  10/20/2018          189  09/03/2018          186  08/02/2018        189   01/29/2018         185   12/18/17 183 lb (83 kg)  11/18/17 183 lb (83 kg)  10/12/17 183 lb (83 kg)    Vital signs reviewed  06/26/2023  - Note at rest 02 sats  96% on 3lpm POC   General appearance:    amb  bm nad    HEENT : Oropharynx  clear   Nasal turbinates nl    NECK :  without  apparent JVD/ palpable Nodes/TM    LUNGS: no acc muscle use,  Min barrel  contour chest wall with bilateral  slightly decreased bs s audible wheeze and  without cough on insp or exp maneuvers and min  Hyperresonant  to  percussion bilaterally    CV:  RRR  no s3 or murmur or increase in P2, and no edema   ABD:  soft and nontender with pos end  insp Hoover's  in the supine position.  No bruits or organomegaly  appreciated   MS:  Nl gait/ ext warm without deformities Or obvious joint restrictions  calf tenderness, cyanosis or clubbing     SKIN: warm and dry without lesions    NEURO:  alert, approp, nl sensorium with  no motor or cerebellar deficits apparent.                Assessment:

## 2023-06-26 NOTE — Patient Instructions (Addendum)
Try stiolto 1 puff each am to see if your breathing is the same or need for albuterol goes up and if so increase back to 2 pffs each am    My office will be contacting you by phone for referral to lung cancer sreening program  - if you don't hear back from my office within one week please call us back or notify us thru MyChart and we'll address it right away.   Please schedule a follow up visit in 6 months but call sooner if needed to see Buelah Manis NP

## 2023-07-03 DIAGNOSIS — E559 Vitamin D deficiency, unspecified: Secondary | ICD-10-CM | POA: Diagnosis not present

## 2023-07-03 DIAGNOSIS — Z114 Encounter for screening for human immunodeficiency virus [HIV]: Secondary | ICD-10-CM | POA: Diagnosis not present

## 2023-07-03 DIAGNOSIS — I129 Hypertensive chronic kidney disease with stage 1 through stage 4 chronic kidney disease, or unspecified chronic kidney disease: Secondary | ICD-10-CM | POA: Diagnosis not present

## 2023-07-03 DIAGNOSIS — E663 Overweight: Secondary | ICD-10-CM | POA: Diagnosis not present

## 2023-07-03 DIAGNOSIS — H9319 Tinnitus, unspecified ear: Secondary | ICD-10-CM | POA: Diagnosis not present

## 2023-07-03 DIAGNOSIS — Z136 Encounter for screening for cardiovascular disorders: Secondary | ICD-10-CM | POA: Diagnosis not present

## 2023-07-03 DIAGNOSIS — N1831 Chronic kidney disease, stage 3a: Secondary | ICD-10-CM | POA: Diagnosis not present

## 2023-07-03 DIAGNOSIS — Z79899 Other long term (current) drug therapy: Secondary | ICD-10-CM | POA: Diagnosis not present

## 2023-07-03 DIAGNOSIS — J449 Chronic obstructive pulmonary disease, unspecified: Secondary | ICD-10-CM | POA: Diagnosis not present

## 2023-07-03 DIAGNOSIS — G5600 Carpal tunnel syndrome, unspecified upper limb: Secondary | ICD-10-CM | POA: Diagnosis not present

## 2023-07-03 DIAGNOSIS — Z1159 Encounter for screening for other viral diseases: Secondary | ICD-10-CM | POA: Diagnosis not present

## 2023-07-03 DIAGNOSIS — R7303 Prediabetes: Secondary | ICD-10-CM | POA: Diagnosis not present

## 2023-07-09 DIAGNOSIS — E119 Type 2 diabetes mellitus without complications: Secondary | ICD-10-CM | POA: Diagnosis not present

## 2023-07-09 DIAGNOSIS — H35373 Puckering of macula, bilateral: Secondary | ICD-10-CM | POA: Diagnosis not present

## 2023-07-09 DIAGNOSIS — Z961 Presence of intraocular lens: Secondary | ICD-10-CM | POA: Diagnosis not present

## 2023-07-09 DIAGNOSIS — H04123 Dry eye syndrome of bilateral lacrimal glands: Secondary | ICD-10-CM | POA: Diagnosis not present

## 2023-07-09 DIAGNOSIS — H26492 Other secondary cataract, left eye: Secondary | ICD-10-CM | POA: Diagnosis not present

## 2023-07-17 DIAGNOSIS — J9611 Chronic respiratory failure with hypoxia: Secondary | ICD-10-CM | POA: Diagnosis not present

## 2023-07-17 DIAGNOSIS — J069 Acute upper respiratory infection, unspecified: Secondary | ICD-10-CM | POA: Diagnosis not present

## 2023-07-17 DIAGNOSIS — J449 Chronic obstructive pulmonary disease, unspecified: Secondary | ICD-10-CM | POA: Diagnosis not present

## 2023-07-17 DIAGNOSIS — N1831 Chronic kidney disease, stage 3a: Secondary | ICD-10-CM | POA: Diagnosis not present

## 2023-07-17 DIAGNOSIS — H9319 Tinnitus, unspecified ear: Secondary | ICD-10-CM | POA: Diagnosis not present

## 2023-07-17 DIAGNOSIS — R7303 Prediabetes: Secondary | ICD-10-CM | POA: Diagnosis not present

## 2023-07-17 DIAGNOSIS — G5600 Carpal tunnel syndrome, unspecified upper limb: Secondary | ICD-10-CM | POA: Diagnosis not present

## 2023-07-17 DIAGNOSIS — Z0001 Encounter for general adult medical examination with abnormal findings: Secondary | ICD-10-CM | POA: Diagnosis not present

## 2023-07-17 DIAGNOSIS — E663 Overweight: Secondary | ICD-10-CM | POA: Diagnosis not present

## 2023-07-17 DIAGNOSIS — I129 Hypertensive chronic kidney disease with stage 1 through stage 4 chronic kidney disease, or unspecified chronic kidney disease: Secondary | ICD-10-CM | POA: Diagnosis not present

## 2023-07-17 DIAGNOSIS — I70222 Atherosclerosis of native arteries of extremities with rest pain, left leg: Secondary | ICD-10-CM | POA: Diagnosis not present

## 2023-07-17 DIAGNOSIS — I700 Atherosclerosis of aorta: Secondary | ICD-10-CM | POA: Diagnosis not present

## 2023-07-20 ENCOUNTER — Other Ambulatory Visit: Payer: Self-pay

## 2023-07-20 ENCOUNTER — Telehealth: Payer: Self-pay | Admitting: Internal Medicine

## 2023-07-20 DIAGNOSIS — Z87891 Personal history of nicotine dependence: Secondary | ICD-10-CM

## 2023-07-20 DIAGNOSIS — Z122 Encounter for screening for malignant neoplasm of respiratory organs: Secondary | ICD-10-CM

## 2023-07-20 NOTE — Telephone Encounter (Signed)
Pt needs a 1 month prescription for Tiotropium Bromide-Olodaterol (STIOLTO RESPIMAT) 2.5-2.5 MCG/ACT AERS  [facility]) - [place], [place] - [address]

## 2023-07-21 MED ORDER — STIOLTO RESPIMAT 2.5-2.5 MCG/ACT IN AERS: 2.0000 | INHALATION_SPRAY | Freq: Every day | RESPIRATORY_TRACT | 0 refills | Status: DC

## 2023-07-21 NOTE — Telephone Encounter (Signed)
Refill sent.

## 2023-07-29 ENCOUNTER — Encounter: Payer: Self-pay | Admitting: Acute Care

## 2023-07-29 ENCOUNTER — Ambulatory Visit: Payer: Medicare Other | Admitting: Acute Care

## 2023-07-29 DIAGNOSIS — Z87891 Personal history of nicotine dependence: Secondary | ICD-10-CM

## 2023-07-29 NOTE — Progress Notes (Signed)
Virtual Visit via Telephone Note  I connected with Nicole Spence on 07/29/23 at 11:30 AM EST by telephone and verified that I am speaking with the correct person using two identifiers.  Location: Patient:  At home Provider:  [address], [place], [place], [address]    I discussed the limitations, risks, security and privacy concerns of performing an evaluation and management service by telephone and the availability of in person appointments. I also discussed with the patient that there may be a patient responsible charge related to this service. The patient expressed understanding and agreed to proceed.   Shared Decision Making Visit Lung Cancer Screening Program [phone])   Eligibility: Age 77 y.o. Pack Years Smoking History Calculation  45 pack year smoking history (# packs/per year x # years smoked) Recent History of coughing up blood  no Unexplained weight loss? no ( >Than 15 pounds within the last 6 months ) Prior History Lung / other cancer no (Diagnosis within the last 5 years already requiring surveillance chest CT Scans). Smoking Status Former Smoker Former Smokers: Years since quit: 9 years  Quit Date: 2015  Visit Components: Discussion included one or more decision making aids. yes Discussion included risk/benefits of screening. yes Discussion included potential follow up diagnostic testing for abnormal scans. yes Discussion included meaning and risk of over diagnosis. yes Discussion included meaning and risk of False Positives. yes Discussion included meaning of total radiation exposure. yes  Counseling Included: Importance of adherence to annual lung cancer LDCT screening. yes Impact of comorbidities on ability to participate in the program. yes Ability and willingness to under diagnostic treatment. yes  Smoking Cessation Counseling: Current Smokers:  Discussed importance of smoking cessation. yes Information about tobacco cessation classes and  interventions provided to patient. yes Patient provided with "ticket" for LDCT Scan. yes Symptomatic Patient. no  Counseling NA Diagnosis Code: Tobacco Use Z72.0 Asymptomatic Patient yes  Counseling (Intermediate counseling: > three minutes counseling) N0272 Former Smokers:  Discussed the importance of maintaining cigarette abstinence. yes Diagnosis Code: Personal History of Nicotine Dependence. Z36.644 Information about tobacco cessation classes and interventions provided to patient. Yes Patient provided with "ticket" for LDCT Scan. yes Written Order for Lung Cancer Screening with LDCT placed in Epic. Yes (CT Chest Lung Cancer Screening Low Dose W/O CM) [id number] Z12.2-Screening of respiratory organs Z87.891-Personal history of nicotine dependence   Bevelyn Ngo, NP 07/29/2023

## 2023-07-29 NOTE — Patient Instructions (Signed)

## 2023-07-30 ENCOUNTER — Ambulatory Visit
Admission: RE | Admit: 2023-07-30 | Discharge: 2023-07-30 | Disposition: A | Discharge status: Home / Self Care | Payer: Medicare Other | Source: Ambulatory Visit | Attending: Family Medicine | Admitting: Family Medicine

## 2023-07-30 DIAGNOSIS — Z122 Encounter for screening for malignant neoplasm of respiratory organs: Secondary | ICD-10-CM

## 2023-07-30 DIAGNOSIS — Z87891 Personal history of nicotine dependence: Secondary | ICD-10-CM | POA: Diagnosis not present

## 2023-08-05 ENCOUNTER — Telehealth: Payer: Self-pay | Admitting: Family Medicine

## 2023-08-05 NOTE — Telephone Encounter (Signed)
Spoke to patient to schedule AWV. Patient stated she has changed providers

## 2023-08-27 ENCOUNTER — Telehealth: Payer: Self-pay | Admitting: Acute Care

## 2023-08-27 NOTE — Telephone Encounter (Signed)
 Patient had LDCT on 07/30/2023 that resulted as a LR1. However there is ILD progression since 2022. Patient is a Systems Developer patient; Dr. Darlean was notified of findings and advised to refer to Dr. Theophilus or Dr. Geronimo for ILD. Will need to call patient to review results and schedule consult with ILD provider.   IMPRESSION: 1. Lung-RADS 1, negative. Continue annual screening with low-dose chest CT without contrast in 12 months. 2. Patchy peripheral coarsened ground-glass and subpleural reticular densities with traction bronchiolectasis, progressive from 01/26/2021. Findings may be due to fibrotic nonspecific interstitial pneumonitis. 3. Cylindrical bronchiectasis. 4.  Aortic atherosclerosis (ICD10-I70.0).  Coronary calcification. 5.  Emphysema (ICD10-J43.9).     Electronically Signed   By: Newell Eke M.D.   On: 08/13/2023 14:17

## 2023-08-28 ENCOUNTER — Other Ambulatory Visit: Payer: Self-pay

## 2023-08-28 DIAGNOSIS — Z122 Encounter for screening for malignant neoplasm of respiratory organs: Secondary | ICD-10-CM

## 2023-08-28 DIAGNOSIS — Z87891 Personal history of nicotine dependence: Secondary | ICD-10-CM

## 2023-08-28 NOTE — Telephone Encounter (Signed)
 Called patient. LVM to call office and review results.

## 2023-08-28 NOTE — Telephone Encounter (Signed)
 Spoke with patient and reviewed LCS CT results. Pt in agreement to see Dr. Marchelle Gearing 11/04/2023 at 10:30am to discuss ILD at the request of Dr. Sherene Sires. ILD questionnaire to be mailed to patient.

## 2023-09-04 ENCOUNTER — Telehealth: Payer: Self-pay | Admitting: Internal Medicine

## 2023-09-04 NOTE — Telephone Encounter (Addendum)
 [facility] needs chart notes, 6 min walk test & showing oxygen levels at rest  Fax: [phone]

## 2023-09-09 NOTE — Telephone Encounter (Signed)
 Patient checking on the message for oxygen . Patient phone number is [phone].

## 2023-09-10 NOTE — Telephone Encounter (Signed)
Ov scheduled for qualifying walk for [facility]. NFN

## 2023-09-10 NOTE — Telephone Encounter (Signed)
ATC pt X1. Unable to lvm.

## 2023-09-10 NOTE — Telephone Encounter (Signed)
Contacted patient and notified that she would need a SMW done in order to send to [facility] for POC.Nicole Spence Her last ov with MW was in Nov 24.

## 2023-09-16 ENCOUNTER — Telehealth: Payer: Self-pay | Admitting: Internal Medicine

## 2023-09-16 NOTE — Telephone Encounter (Signed)
Nicole Spence states needs order signed, chart notes, and 6 minute walk test. Nicole Spence phone number is [phone]. Fax number is [phone].

## 2023-09-23 NOTE — Telephone Encounter (Signed)
Spoke with Learta Codding  She is aware that the pt is scheduled for ov for o2 qualification   Will handle this request then  Nothing further needed at this time

## 2023-10-07 ENCOUNTER — Ambulatory Visit (INDEPENDENT_AMBULATORY_CARE_PROVIDER_SITE_OTHER): Payer: Medicare Other | Admitting: Primary Care

## 2023-10-07 ENCOUNTER — Encounter: Payer: Self-pay | Admitting: Primary Care

## 2023-10-07 VITALS — BP 121/79 | HR 100 | Temp 98.3°F | Ht 65.0 in | Wt 170.8 lb

## 2023-10-07 DIAGNOSIS — J9611 Chronic respiratory failure with hypoxia: Secondary | ICD-10-CM | POA: Diagnosis not present

## 2023-10-07 DIAGNOSIS — J449 Chronic obstructive pulmonary disease, unspecified: Secondary | ICD-10-CM | POA: Diagnosis not present

## 2023-10-07 NOTE — Progress Notes (Signed)
@Patient  ID: Nicole Spence, female    DOB: 1946-02-10, 78 y.o.   MRN: [id number]  Chief Complaint  Patient presents with   Follow-up    Referring provider: Georganna Skeans, MD  HPI: 78 year old female, former smoker.  Past medical history significant for hypertension, COPD Gold 0, chronic respiratory failure with hypoxia, community-acquired pneumonia, allergic rhinitis, GERD, today.  Patient of Dr. Sherene Sires.   10/07/2023 Discussed the use of AI scribe software for clinical note transcription with the patient, who gave verbal consent to proceed.  History of Present Illness   Nicole Spence is a 78 year old female with COPD/emphysema and bronchiectasis who presents for oxygen qualification and equipment update.   Patient presents today to qualify for portable oxygen concentrator.  He has been on oxygen as far back as 2019.  Uses 2 L of oxygen as needed with exertion. Current oxygen supplier is [facility]. She is looking to change from [facility] to [facility] for both a home concentrator and a new portable concentrator. She previously used a Insurance underwriter provided by [employer], which was taken back, and has since purchased a portable concentrator independently. She has been in contact with [facility], and the necessary paperwork has been sent by a representative named Sunny.  She typically uses two liters of oxygen while sleeping and increases to three liters during activities such as housework. When out in the community, she uses a portable concentrator set at three liters. There have been no significant changes in her breathing recently.  In December 2024, she underwent a lung cancer screening which showed emphysema and bronchiectasis, with no suspicious pulmonary nodules. The bronchiectasis has worsened since 2022. She mainly experiences shortness of breath without a significant cough, and her sinus issues are more troublesome than her lung symptoms. She has an apt with Dr. Marchelle Gearing later this month for evaluation.    She uses Flonase and Claritin for rhinitis and is on Stiolto for COPD/emphysema. She also has a flutter valve to help with congestion, although she rarely feels congested unless she has a severe cold.        Allergies  Allergen Reactions   Adhesive [Tape] Other (See Comments)    IRRITATES SKIN     Immunization History  Administered Date(s) Administered   Fluad Quad(high Dose 65+) 05/11/2019, 05/16/2021   Influenza, High Dose Seasonal PF 09/20/2018, 05/24/2020   Influenza,inj,Quad PF,6+ Mos 05/04/2014, 09/21/2015, 06/17/2016   Influenza-Unspecified 04/26/2019   PFIZER(Purple Top)SARS-COV-2 Vaccination 10/15/2019, 11/08/2019, 05/24/2020, 11/23/2020   PNEUMOCOCCAL CONJUGATE-20 01/24/2021   Pfizer Covid-19 Vaccine Bivalent Booster 5y-11y 05/16/2021   Pneumococcal Conjugate-13 01/25/2014   Pneumococcal Polysaccharide-23 03/14/2015   Td 03/17/2017   Tdap 12/24/2006   Zoster Recombinant(Shingrix) 03/25/2021   Zoster, Live 05/26/2014    Past Medical History:  Diagnosis Date   Cataract    Colon polyps 02/09/2007   [facility]; colonoscopy.   COPD (chronic obstructive pulmonary disease) (HCC)    Diverticula of colon 02/09/2007   Glucose intolerance (impaired glucose tolerance)    Hypertension    Internal hemorrhoids 02/09/2007.   Pneumonia 07/2018    Tobacco History: Social History   Tobacco Use  Smoking Status Former   Current packs/day: 0.00   Average packs/day: 1 pack/day for 30.0 years (30.0 ttl pk-yrs)   Types: Cigarettes   Start date: 10/24/1983   Quit date: 10/23/2013   Years since quitting: 9.9  Smokeless Tobacco Never   Counseling given: Not Answered   Outpatient Medications Prior to Visit  Medication Sig Dispense Refill   famotidine (  PEPCID) 20 MG tablet TAKE 1 TABLET BY MOUTH TWICE  DAILY AFTER BREAKFAST AND AFTER  SUPPER 180 tablet 3   fluticasone (FLONASE) 50 MCG/ACT nasal spray USE 2 SPRAYS IN BOTH NOSTRILS  DAILY 48 g 1   hydrochlorothiazide (HYDRODIURIL) 25 MG  tablet TAKE 1 TABLET BY MOUTH DAILY 90 tablet 3   loratadine (CLARITIN) 10 MG tablet Take 1 tablet (10 mg total) by mouth daily. 30 tablet 11   Multiple Vitamins-Minerals (WOMENS 50+ MULTI VITAMIN/MIN) TABS Take 1 tablet by mouth daily.     OXYGEN Inhale 2-3 L into the lungs See admin instructions. 2lpm with sleep and 3lpm with exertion     Polyvinyl Alcohol-Povidone PF (REFRESH) 1.4-0.6 % SOLN Place 1 drop into both eyes daily.     potassium chloride SA (KLOR-CON M) 20 MEQ tablet TAKE 1 TABLET BY MOUTH DAILY 90 tablet 3   PROAIR HFA 108 (90 Base) MCG/ACT inhaler Inhale 2 puffs into the lungs every 4 (four) hours as needed for wheezing or shortness of breath. 18 g 3   Respiratory Therapy Supplies (FLUTTER) DEVI Use 3 times a day as needed for congestion. 1 each 0   Tiotropium Bromide-Olodaterol (STIOLTO RESPIMAT) 2.5-2.5 MCG/ACT AERS Inhale 2 puffs into the lungs daily. **NEEDS OV FOR FURTHER REFILLS** 12 g 0   gabapentin (NEURONTIN) 100 MG capsule Take 1 capsule (100 mg total) by mouth 3 (three) times daily. 270 capsule 3   hydroquinone 4 % cream Apply 1 application topically daily. (Patient not taking: Reported on 10/07/2023)     Tiotropium Bromide Monohydrate (SPIRIVA RESPIMAT) 2.5 MCG/ACT AERS Inhale 2 puffs into the lungs daily. (Patient not taking: Reported on 10/07/2023)     No facility-administered medications prior to visit.    Review of Systems  Review of Systems  Constitutional: Negative.   HENT: Negative.    Respiratory:  Positive for shortness of breath. Negative for cough, chest tightness and wheezing.   Cardiovascular: Negative.      Physical Exam  BP 121/79 (BP Location: Right Arm, Patient Position: Sitting, Cuff Size: Large)   Pulse 100   Temp 98.3 F (36.8 C) (Oral)   Ht 5\' 5"  (1.651 m)   Wt 170 lb 12.8 oz (77.5 kg)   SpO2 97%   BMI 28.42 kg/m  Physical Exam Constitutional:      Appearance: Normal appearance.  HENT:     Head: Normocephalic and atraumatic.      Mouth/Throat:     Mouth: Mucous membranes are moist.     Pharynx: Oropharynx is clear.  Cardiovascular:     Rate and Rhythm: Normal rate and regular rhythm.  Pulmonary:     Effort: Pulmonary effort is normal.     Breath sounds: Normal breath sounds. No wheezing.     Comments: 3L poc Musculoskeletal:        General: Normal range of motion.  Skin:    General: Skin is warm and dry.  Neurological:     General: No focal deficit present.     Mental Status: She is alert and oriented to person, place, and time. Mental status is at baseline.  Psychiatric:        Mood and Affect: Mood normal.        Behavior: Behavior normal.        Thought Content: Thought content normal.        Judgment: Judgment normal.      Lab Results:  CBC    Component Value Date/Time  WBC 6.9 12/24/2021 0937   WBC 10.8 (A) 09/01/2018 1613   WBC 7.7 08/21/2018 0549   RBC 4.88 12/24/2021 0937   RBC 5.40 09/01/2018 1613   RBC 4.77 08/21/2018 0549   HGB 14.6 12/24/2021 0937   HCT 44.3 12/24/2021 0937   PLT 253 12/24/2021 0937   MCV 91 12/24/2021 0937   MCH 29.9 12/24/2021 0937   MCH 30.0 09/01/2018 1613   MCH 29.4 08/21/2018 0549   MCHC 33.0 12/24/2021 0937   MCHC 34.0 09/01/2018 1613   MCHC 31.7 08/21/2018 0549   RDW 13.5 12/24/2021 0937   LYMPHSABS 2.4 12/24/2021 0937   MONOABS 0.7 08/20/2018 1908   EOSABS 0.3 12/24/2021 0937   BASOSABS 0.0 12/24/2021 0937    BMET    Component Value Date/Time   NA 142 12/24/2021 0937   K 3.9 12/24/2021 0937   CL 100 12/24/2021 0937   CO2 30 (H) 12/24/2021 0937   GLUCOSE 86 12/24/2021 0937   GLUCOSE 151 (H) 08/21/2018 0549   BUN 9 12/24/2021 0937   CREATININE 1.03 (H) 12/24/2021 0937   CREATININE 0.88 06/17/2016 0821   CALCIUM 10.2 12/24/2021 0937   GFRNONAA 61 03/20/2020 0905   GFRNONAA 56 (L) 07/26/2014 0951   GFRAA 71 03/20/2020 0905   GFRAA 65 07/26/2014 0951    BNP    Component Value Date/Time   BNP 34.4 08/20/2018 1903    ProBNP No  results found for: "PROBNP"  Imaging: No results found.   Assessment & Plan:   1. Chronic respiratory failure with hypoxia (HCC) (Primary) - AMB REFERRAL FOR DME  2. COPD mixed type (HCC) - AMB REFERRAL FOR DME     COPD with Oxygen Requirement Patient uses 2L supplemental oxygen at rest and 3L with exertion. Patient wishes to switch from [facility] to [facility] for oxygen supply and needs both home and portable concentrators. -Coordinate with [facility] to provide home and portable oxygen concentrators. -Continue Stiolto and prn Albuterol for COPD management.  Bronchiectasis Recent lung cancer screening showed progression of bronchiectasis since 2022. No significant cough or congestion reported by the patient. -She has been referred to Dr. Marchelle Gearing for further evaluation of bronchiectasis and potential underlying causes. -Continue use of flutter valve as needed for congestion.  Allergic Rhinitis Managed with Flonase and Claritin. -Continue current management.  Follow-up Scheduled appointment in May. -Continue with scheduled follow-up in May as per patient's preference.      Glenford Bayley, NP 10/07/2023

## 2023-10-07 NOTE — Patient Instructions (Addendum)
-  COPD WITH OXYGEN REQUIREMENT: Chronic Obstructive Pulmonary Disease (COPD) is a long-term lung condition that makes it hard to breathe. You need oxygen therapy, using 2 liters at rest and 3 liters during activities. We are coordinating with [employer] to provide you with new home and portable oxygen concentrators. Continue using Stiolto as prescribed for managing your COPD.  -BRONCHIECTASIS: Bronchiectasis is a condition where the airways in your lungs become damaged and widened, leading to mucus build-up and infections. Your recent lung cancer screening showed that your bronchiectasis has worsened since 2022. We have referred you to Dr. Marchelle Gearing for further evaluation and to explore potential underlying causes. Continue using the flutter valve as needed for congestion.  -ALLERGIC RHINITIS: Allergic rhinitis is an allergic reaction that causes sneezing, congestion, and a runny nose. You are currently managing this with Flonase and Claritin. Continue with your current management plan.  INSTRUCTIONS: Please continue with your scheduled follow-up appointment in May. Ensure you coordinate with [employer] to receive your new home and portable oxygen concentrators. Follow up with Dr. Marchelle Gearing for further evaluation of your bronchiectasis.

## 2023-10-08 ENCOUNTER — Telehealth: Payer: Self-pay | Admitting: Internal Medicine

## 2023-10-08 NOTE — Telephone Encounter (Signed)
Learta Codding states needs 6 minute walk test. Phone number is [phone]. Fax number is [phone].

## 2023-10-13 NOTE — Telephone Encounter (Signed)
Pt needs to be scheduled. Please call and schedule this.

## 2023-10-14 ENCOUNTER — Other Ambulatory Visit: Payer: Self-pay | Admitting: Internal Medicine

## 2023-10-14 NOTE — Telephone Encounter (Signed)
Patient states that she had walk during her last appointment to re-qualify for oxygen. She is now wondering if her information was sent to [employer] for her oxygen and portable oxygen. Please call and advise [phone]

## 2023-10-15 NOTE — Telephone Encounter (Signed)
 Noted

## 2023-10-15 NOTE — Telephone Encounter (Signed)
Lm for patient.  Order placed to [facility] 2/12

## 2023-10-15 NOTE — Telephone Encounter (Signed)
Notified patient of order being placed on February 12th.

## 2023-10-16 ENCOUNTER — Telehealth: Payer: Self-pay | Admitting: Primary Care

## 2023-10-16 NOTE — Telephone Encounter (Signed)
[employer] needs a copy of patient's walk test. It needs to be faxed to [phone] Attention [place].It has to clearly state oxygen levels with all three points.

## 2023-10-16 NOTE — Telephone Encounter (Signed)
 Please advise. Thank you

## 2023-10-17 NOTE — Telephone Encounter (Signed)
 Please call the number listed to find out what they need .

## 2023-10-20 NOTE — Telephone Encounter (Signed)
 LVM for [facility] @ [facility] patient was not walked last office visit but is scheduled for a walk in March 2025.

## 2023-10-21 ENCOUNTER — Telehealth: Payer: Self-pay | Admitting: Primary Care

## 2023-10-21 NOTE — Telephone Encounter (Signed)
 Patient is calling back stating she had a walk test on the 12th of February and that [employer] did not receive the completed document of her walk test. I am unsure if she had the walk test at her last appointment or if she will have it in the future. Please discuss with [employer] what is needed. She has gone two months without her home oxygen system.

## 2023-10-21 NOTE — Telephone Encounter (Signed)
 [employer] now calling. She said Nicole Spence told her no 6 min walk test was done at this PT  appt 2/12. Here I what that AVS states from that day:  You need oxygen therapy, using 2 liters at rest and 3 liters during activities. We are coordinating with [employer] to provide you with new home and portable oxygen concentrators.  [employer] needs the walk test results before they can get her 02 order processed. She is ret sonia's call.   If it was not done please let the PT know because she insists the purpose of this appt was the walk test.  [phone] [name]  Can someone assist?

## 2023-10-22 NOTE — Telephone Encounter (Signed)
 Lm for Nicole Spence with [employer].   Pt was walked during last OV. PCC's, can you guys send these notes?

## 2023-10-28 ENCOUNTER — Other Ambulatory Visit: Payer: Self-pay | Admitting: Internal Medicine

## 2023-11-04 ENCOUNTER — Ambulatory Visit: Payer: Medicare Other | Admitting: Internal Medicine

## 2023-11-04 ENCOUNTER — Encounter: Payer: Self-pay | Admitting: Internal Medicine

## 2023-11-04 VITALS — BP 110/64 | HR 90 | Ht 65.0 in | Wt 172.4 lb

## 2023-11-04 DIAGNOSIS — R9389 Abnormal findings on diagnostic imaging of other specified body structures: Secondary | ICD-10-CM

## 2023-11-04 DIAGNOSIS — R0609 Other forms of dyspnea: Secondary | ICD-10-CM

## 2023-11-04 DIAGNOSIS — R942 Abnormal results of pulmonary function studies: Secondary | ICD-10-CM | POA: Diagnosis not present

## 2023-11-04 DIAGNOSIS — Z87891 Personal history of nicotine dependence: Secondary | ICD-10-CM | POA: Diagnosis not present

## 2023-11-04 DIAGNOSIS — I2510 Atherosclerotic heart disease of native coronary artery without angina pectoris: Secondary | ICD-10-CM

## 2023-11-04 LAB — BRAIN NATRIURETIC PEPTIDE: Pro B Natriuretic peptide (BNP): 19 pg/mL (ref 0.0–100.0)
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
## 2023-11-04 NOTE — Patient Instructions (Addendum)
 Shortness of breath Chronic hypoxemic respiratory failure Abnormal pulmonary function test Abnormal CT scan of the chest  -Low-dose CT scan of the chest while ruling out lung cancer suggesting interstitial lung disease -But glad to know that you are chronically stable.  Years on 2-lead 3 L of nasal cannula oxygen at rest  Plan - Do full pulmonary function test - Do high-resolution CT chest  -Might need some additional blood work based on results -Check blood BNP -Hold off on CBC and chemistry check because based on a history of getting it checked with your primary care -Continue oxygen as before - Continue all your medications as before   Coronary artery calcification on CT scan  Plan - Do echocardiogram - Refer cardiology -Check blood BNP   Follow-up- -8 weeks Dr. Marchelle Gearing but after completing all of the tests above

## 2023-12-02 ENCOUNTER — Ambulatory Visit (INDEPENDENT_AMBULATORY_CARE_PROVIDER_SITE_OTHER)

## 2023-12-02 DIAGNOSIS — Z87891 Personal history of nicotine dependence: Secondary | ICD-10-CM | POA: Diagnosis not present

## 2023-12-02 DIAGNOSIS — R0609 Other forms of dyspnea: Secondary | ICD-10-CM | POA: Diagnosis not present

## 2023-12-02 DIAGNOSIS — R942 Abnormal results of pulmonary function studies: Secondary | ICD-10-CM

## 2023-12-02 DIAGNOSIS — R9389 Abnormal findings on diagnostic imaging of other specified body structures: Secondary | ICD-10-CM

## 2023-12-02 LAB — ECHOCARDIOGRAM COMPLETE
Area-P 1/2: 3.91 cm2
S' Lateral: 2.76 cm

## 2023-12-24 ENCOUNTER — Encounter: Payer: Self-pay | Admitting: Primary Care

## 2023-12-24 ENCOUNTER — Ambulatory Visit: Payer: Medicare Other | Admitting: Primary Care

## 2023-12-24 VITALS — BP 120/58 | HR 96 | Temp 98.3°F | Ht 65.0 in | Wt 170.8 lb

## 2023-12-24 DIAGNOSIS — J449 Chronic obstructive pulmonary disease, unspecified: Secondary | ICD-10-CM | POA: Diagnosis not present

## 2023-12-24 DIAGNOSIS — R0609 Other forms of dyspnea: Secondary | ICD-10-CM

## 2023-12-24 DIAGNOSIS — J9611 Chronic respiratory failure with hypoxia: Secondary | ICD-10-CM

## 2023-12-24 NOTE — Progress Notes (Signed)
 @Patient  ID: Nicole Spence, female    DOB: June 15, 1946, 78 y.o.   MRN: [id number]  Chief Complaint  Patient presents with   Follow-up    Sob-same, denies cough/wheeze    Referring provider: Abraham Abo, MD  HPI: 78 year old female, former smoker.  Past medical history significant for hypertension, COPD Gold 0, chronic respiratory failure with hypoxia, community-acquired pneumonia, allergic rhinitis, GERD, today.  Patient of Dr. Waymond Hailey.   Previous [facility] pulmonary encounter:  10/07/2023 Discussed the use of AI scribe software for clinical note transcription with the patient, who gave verbal consent to proceed.  History of Present Illness   Nicole Spence is a 78 year old female with COPD/emphysema and bronchiectasis who presents for oxygen  qualification and equipment update.   Patient presents today to qualify for portable oxygen  concentrator.  He has been on oxygen  as far back as 2019.  Uses 2 L of oxygen  as needed with exertion. Current oxygen  supplier is [employer]. She is looking to change from [employer] to [employer] for both a home concentrator and a new portable concentrator. She previously used a Insurance underwriter provided by [employer], which was taken back, and has since purchased a portable concentrator independently. She has been in contact with [employer], and the necessary paperwork has been sent by a representative named Sunny.  She typically uses two liters of oxygen  while sleeping and increases to three liters during activities such as housework. When out in the community, she uses a portable concentrator set at three liters. There have been no significant changes in her breathing recently.  In December 2024, she underwent a lung cancer screening which showed emphysema and bronchiectasis, with no suspicious pulmonary nodules. The bronchiectasis has worsened since 2022. She mainly experiences shortness of breath without a significant cough, and her sinus issues are more troublesome than her lung symptoms.  She has an apt with Dr. Bertrum Brodie later this month for evaluation.   She uses Flonase  and Claritin  for rhinitis and is on Stiolto for COPD/emphysema. She also has a flutter valve to help with congestion, although she rarely feels congested unless she has a severe cold.    10/07/2023 Discussed the use of AI scribe software for clinical note transcription with the patient, who gave verbal consent to proceed.  History of Present Illness   Nicole Spence is a 78 year old female with COPD/emphysema and bronchiectasis who presents for oxygen  qualification and equipment update.   Patient presents today to qualify for portable oxygen  concentrator.  He has been on oxygen  as far back as 2019.  Uses 2 L of oxygen  as needed with exertion. Current oxygen  supplier is [employer]. She is looking to change from [employer] to [employer] for both a home concentrator and a new portable concentrator. She previously used a Insurance underwriter provided by [employer], which was taken back, and has since purchased a portable concentrator independently. She has been in contact with [employer], and the necessary paperwork has been sent by a representative named Sunny.  She typically uses two liters of oxygen  while sleeping and increases to three liters during activities such as housework. When out in the community, she uses a portable concentrator set at three liters. There have been no significant changes in her breathing recently.  In December 2024, she underwent a lung cancer screening which showed emphysema and bronchiectasis, with no suspicious pulmonary nodules. The bronchiectasis has worsened since 2022. She mainly experiences shortness of breath without a significant cough, and her sinus issues are more troublesome than her lung symptoms.  She has an apt with Dr. Bertrum Brodie later this month for evaluation.   She uses Flonase  and Claritin  for rhinitis and is on Stiolto for COPD/emphysema. She also has a flutter valve to help with congestion,  although she rarely feels congested unless she has a severe cold.      OV 11/04/2023 transfer of care from Dr. Vernestine Gondola to Dr. Bertrum Brodie over concerns of interstitial lung disease.  Subjective:  Patient ID: Nicole Spence, female , DOB: [birth year] , age 78 y.o. , MRN: [id number] , ADDRESS: [id number] [place] [address] PCP Abraham Abo, MD Patient Care Team: Abraham Abo, MD as PCP - General (Family Medicine) Maris Sickle, MD as Consulting Physician (Ophthalmology) Diamond Formica, MD as Consulting Physician (Pulmonary Disease)  This Provider for this visit: Treatment Team:  Attending Provider: Maire Scot, MD  11/04/2023 -   Chief Complaint  Patient presents with   Follow-up    ILD eval. She states her breathing has been stable. She has had dry cough over the past wk- relates to pollen.    Nicole Spence 78 y.o. -she is a former smoker having quit 10 years ago.  Over 30 pack smoking history.  She tells me that she was placed on oxygen  approximately 4 to 5 years ago following hospitalization.  Was not COVID but she says diagnosed with COPD.  Since then she uses 2 L nasal cannula at rest with 3 L exertion.  This has been stable.  Overall shortness of breath symptom score is below and has had no change.  Her cough is very minimal.  Last seen by Dr. Vernestine Gondola in November 2024.  Then in December 2020.  Sallee Craw for lung cancer screening.  On the CT scan of the chest was abnormal.  There was some suggestion of ILD.  She saw Eulas Hick nurse practitioner 10/07/2023 and there was concerns of ILD along with known emphysema.  Also bronchiectasis reported..  She is on Stiolto.  She is on oxygen .  Overall no hospitalizations.  Only new interim problem in the last year or 2 his neuropathy.  This has been documented in the chart.  No surgeries otherwise.  I personally visualized the CT chest and agree with the findings although she might just have interstitial lung abnormality  and not ILD.  She is agreeable for workup around this.  Her last pulmonary function test was 2022.  Of note she did desaturate on room air at rest to 83%. Prior pulmonary function test reviewed was several years ago and had isolated reduction diffusion capacity.   Shortness of breath Chronic hypoxemic respiratory failure Abnormal pulmonary function test Abnormal CT scan of the chest   -Low-dose CT scan of the chest while ruling out lung cancer suggesting interstitial lung disease -But glad to know that you are chronically stable.  Years on 2-lead 3 L of nasal cannula oxygen  at rest   Plan - Do full pulmonary function test - Do high-resolution CT chest             -Might need some additional blood work based on results - Check blood BNP - Hold off on CBC and chemistry check because based on a history of getting it checked with your primary care - Continue oxygen  as before - Continue all your medications as before     Coronary artery calcification on CT scan   Plan - Do echocardiogram - Refer cardiology -Check blood BNP     Follow-up- -8 weeks Dr.  Ramaswamy but after completing all of the tests above     12/24/2023 Discussed the use of AI scribe software for clinical note transcription with the patient, who gave verbal consent to proceed.  Patient presents today for follow-up.  She was last seen by Dr. Bertrum Brodie in March and ordered for several tests.  High-resolution CAT scan is scheduled for May 7 and pulmonary function testing scheduled for June 5.  Echocardiogram showed normal ejection fraction 65 to 70% with grade 1 diastolic dysfunction.  BNP was normal at 19.  She is not been set up with an appointment to see cardiology.  History of Present Illness   Nicole Spence is a 78 year old female with COPD and emphysema who presents for a follow-up regarding shortness of breath.  She experiences stable shortness of breath, which has not improved or worsened. She uses  supplemental oxygen  at two liters at rest and three liters with exertion, and she wears it at night. No active cough or sputum production is present, but she mentions sinus drainage due to pollen. She describes occasional chest sensations that feel like a cramp, which 'eases on out'.  She uses a Stiolto inhaler, two puffs in the morning, for her COPD and emphysema, and rarely uses her rescue inhaler. She also uses Flonase  and Claritin  for allergic rhinitis, although her nasal passages remain clogged despite a change to a single nasal spray.  A recent echocardiogram showed normal heart function with mild diastolic dysfunction. Her BNP lab test was normal.  In terms of daily activities, she rates her shortness of breath as zero at rest, two during simple tasks, three to four during household tasks, four while shopping, three when walking on a level surface, and five when walking up stairs. No cough, nausea, vomiting, diarrhea, anxiety, or depression, but she does experience fatigue and joint pain.      SYMPTOM SCALE - ILD 11/04/2023   Current weight    O2 use RA - 83% at rest, She uses 2-3L [place] at rest   Shortness of Breath 0 -> 5 scale with 5 being worst (score 6 If unable to do)   At rest 1 0  Simple tasks - showers, clothes change, eating, shaving 1 2  Household (dishes, doing bed, laundry) 2 3  Shopping 3 4  Walking level at own pace 2 3  Walking up Stairs 2 5  Total (30-36) Dyspnea Score 11   How bad is your cough? 1 0  How bad is your fatigue 2 2  How bad is nausea 0 0  How bad is vomiting?  0 0  How bad is diarrhea? 0 0  How bad is anxiety? 1 0  How bad is depression 1 0  Any chronic pain - if so where and how bad 0 0   Allergies  Allergen Reactions   Adhesive [Tape] Other (See Comments)    IRRITATES SKIN     Immunization History  Administered Date(s) Administered   Fluad Quad(high Dose 65+) 05/11/2019, 05/16/2021   Influenza, High Dose Seasonal PF 09/20/2018, 05/24/2020    Influenza,inj,Quad PF,6+ Mos 05/04/2014, 09/21/2015, 06/17/2016   Influenza-Unspecified 04/26/2019, 05/26/2023   PFIZER(Purple Top)SARS-COV-2 Vaccination 10/15/2019, 11/08/2019, 05/24/2020, 11/23/2020   PNEUMOCOCCAL CONJUGATE-20 01/24/2021   Pfizer Covid-19 Vaccine Bivalent Booster 5y-11y 05/16/2021   Pneumococcal Conjugate-13 01/25/2014   Pneumococcal Polysaccharide-23 03/14/2015   Td 03/17/2017   Tdap 12/24/2006   Zoster Recombinant(Shingrix) 03/25/2021   Zoster, Live 05/26/2014    Past Medical History:  Diagnosis  Date   Cataract    Colon polyps 02/09/2007   [facility] [place]; colonoscopy.   COPD (chronic obstructive pulmonary disease) (HCC)    Diverticula of colon 02/09/2007   Glucose intolerance (impaired glucose tolerance)    Hypertension    Internal hemorrhoids 02/09/2007.   Pneumonia 07/2018    Tobacco History: Social History   Tobacco Use  Smoking Status Former   Current packs/day: 0.00   Average packs/day: 1 pack/day for 30.0 years (30.0 ttl pk-yrs)   Types: Cigarettes   Start date: 10/24/1983   Quit date: 10/23/2013   Years since quitting: 10.1  Smokeless Tobacco Never   Counseling given: Not Answered   Outpatient Medications Prior to Visit  Medication Sig Dispense Refill   Emollient (COLLAGEN EX) Apply topically. 4 scoops daily of collagen mixed in liquid     famotidine  (PEPCID ) 20 MG tablet TAKE 1 TABLET BY MOUTH TWICE  DAILY AFTER BREAKFAST AND AFTER  SUPPER 180 tablet 3   fluticasone  (FLONASE ) 50 MCG/ACT nasal spray USE 2 SPRAYS IN BOTH NOSTRILS  DAILY (Patient taking differently: 1 spray daily.) 48 g 1   hydrochlorothiazide  (HYDRODIURIL ) 25 MG tablet TAKE 1 TABLET BY MOUTH DAILY 90 tablet 3   loratadine  (CLARITIN ) 10 MG tablet Take 1 tablet (10 mg total) by mouth daily. 30 tablet 11   Multiple Vitamins-Minerals (WOMENS 50+ MULTI VITAMIN/MIN) TABS Take 1 tablet by mouth daily.     OVER THE COUNTER MEDICATION Procaps circulation and vein support 2 capsules daily     OXYGEN   Inhale 2-3 L into the lungs See admin instructions. 2lpm with sleep and 3lpm with exertion     Polyvinyl Alcohol-Povidone PF (REFRESH) 1.4-0.6 % SOLN Place 1 drop into both eyes daily.     potassium chloride  SA (KLOR-CON  M) 20 MEQ tablet TAKE 1 TABLET BY MOUTH DAILY 90 tablet 3   PROAIR  HFA 108 (90 Base) MCG/ACT inhaler Inhale 2 puffs into the lungs every 4 (four) hours as needed for wheezing or shortness of breath. 18 g 3   Respiratory Therapy Supplies (FLUTTER) DEVI Use 3 times a day as needed for congestion. 1 each 0   Tiotropium Bromide -Olodaterol (STIOLTO RESPIMAT ) 2.5-2.5 MCG/ACT AERS USE 2 INHALATIONS BY MOUTH DAILY 12 g 1   No facility-administered medications prior to visit.      Review of Systems  Review of Systems  Constitutional: Negative.   HENT: Negative.    Respiratory:  Positive for shortness of breath. Negative for cough and wheezing.   Cardiovascular: Negative.      Physical Exam  There were no vitals taken for this visit. Physical Exam Constitutional:      Appearance: Normal appearance.  HENT:     Head: Normocephalic.     Mouth/Throat:     Mouth: Mucous membranes are moist.     Pharynx: Oropharynx is clear.  Pulmonary:     Effort: Pulmonary effort is normal.     Breath sounds: Normal breath sounds. No wheezing, rhonchi or rales.  Neurological:     General: No focal deficit present.     Mental Status: She is alert and oriented to person, place, and time. Mental status is at baseline.  Psychiatric:        Mood and Affect: Mood normal.        Behavior: Behavior normal.        Thought Content: Thought content normal.        Judgment: Judgment normal.      Lab Results:  CBC  Component Value Date/Time   WBC 6.9 12/24/2021 0937   WBC 10.8 (A) 09/01/2018 1613   WBC 7.7 08/21/2018 0549   RBC 4.88 12/24/2021 0937   RBC 5.40 09/01/2018 1613   RBC 4.77 08/21/2018 0549   HGB 14.6 12/24/2021 0937   HCT 44.3 12/24/2021 0937   PLT 253 12/24/2021 0937    MCV 91 12/24/2021 0937   MCH 29.9 12/24/2021 0937   MCH 30.0 09/01/2018 1613   MCH 29.4 08/21/2018 0549   MCHC 33.0 12/24/2021 0937   MCHC 34.0 09/01/2018 1613   MCHC 31.7 08/21/2018 0549   RDW 13.5 12/24/2021 0937   LYMPHSABS 2.4 12/24/2021 0937   MONOABS 0.7 08/20/2018 1908   EOSABS 0.3 12/24/2021 0937   BASOSABS 0.0 12/24/2021 0937    BMET    Component Value Date/Time   NA 142 12/24/2021 0937   K 3.9 12/24/2021 0937   CL 100 12/24/2021 0937   CO2 30 (H) 12/24/2021 0937   GLUCOSE 86 12/24/2021 0937   GLUCOSE 151 (H) 08/21/2018 0549   BUN 9 12/24/2021 0937   CREATININE 1.03 (H) 12/24/2021 0937   CREATININE 0.88 06/17/2016 0821   CALCIUM 10.2 12/24/2021 0937   GFRNONAA 61 03/20/2020 0905   GFRNONAA 56 (L) 07/26/2014 0951   GFRAA 71 03/20/2020 0905   GFRAA 65 07/26/2014 0951    BNP    Component Value Date/Time   BNP 34.4 08/20/2018 1903    ProBNP    Component Value Date/Time   PROBNP 19.0 11/04/2023 1058    Imaging: ECHOCARDIOGRAM COMPLETE Result Date: 12/02/2023    ECHOCARDIOGRAM REPORT   Patient Name:   Nicole Spence Date of Exam: 12/02/2023 Medical Rec #:  [id number]     Height:       65.0 in Accession #:    [id number]    Weight:       172.4 lb Date of Birth:  [birth year]     BSA:          1.857 m Patient Age:    77 years      BP:           110/60 mmHg Patient Gender: F             HR:           98 bpm. Exam Location:  Outpatient Procedure: 2D Echo, Color Doppler, Cardiac Doppler and Strain Analysis (Both            Spectral and Color Flow Doppler were utilized during procedure). Indications:    Dyspnea  History:        Patient has no prior history of Echocardiogram examinations.                 COPD; Risk Factors:Former Smoker and Hypertension.  Sonographer:    Gelene Kelly RDCS Referring Phys: [id number] MURALI RAMASWAMY IMPRESSIONS  1. Left ventricular ejection fraction, by estimation, is 65 to 70%. Left ventricular ejection fraction by 3D volume is 67 %. The left  ventricle has normal function. The left ventricle has no regional wall motion abnormalities. Left ventricular diastolic  parameters are consistent with Grade I diastolic dysfunction (impaired relaxation). The average left ventricular global longitudinal strain is 17.8 %. The global longitudinal strain is normal.  2. Right ventricular systolic function is normal. The right ventricular size is normal.  3. The mitral valve is normal in structure. No evidence of mitral valve regurgitation. No evidence of mitral stenosis.  4. The aortic valve is tricuspid. There  is mild calcification of the aortic valve. Aortic valve regurgitation is not visualized. Aortic valve sclerosis is present, with no evidence of aortic valve stenosis.  5. The inferior vena cava is normal in size with greater than 50% respiratory variability, suggesting right atrial pressure of 3 mmHg. FINDINGS  Left Ventricle: Left ventricular ejection fraction, by estimation, is 65 to 70%. Left ventricular ejection fraction by 3D volume is 67 %. The left ventricle has normal function. The left ventricle has no regional wall motion abnormalities. The average left ventricular global longitudinal strain is 17.8 %. Strain was performed and the global longitudinal strain is normal. The left ventricular internal cavity size was normal in size. There is no left ventricular hypertrophy. Left ventricular diastolic parameters are consistent with Grade I diastolic dysfunction (impaired relaxation). Right Ventricle: The right ventricular size is normal. No increase in right ventricular wall thickness. Right ventricular systolic function is normal. Left Atrium: Left atrial size was normal in size. Right Atrium: Right atrial size was normal in size. Pericardium: There is no evidence of pericardial effusion. Mitral Valve: The mitral valve is normal in structure. No evidence of mitral valve regurgitation. No evidence of mitral valve stenosis. Tricuspid Valve: The tricuspid  valve is normal in structure. Tricuspid valve regurgitation is mild . No evidence of tricuspid stenosis. Aortic Valve: The aortic valve is tricuspid. There is mild calcification of the aortic valve. Aortic valve regurgitation is not visualized. Aortic valve sclerosis is present, with no evidence of aortic valve stenosis. Pulmonic Valve: The pulmonic valve was normal in structure. Pulmonic valve regurgitation is not visualized. No evidence of pulmonic stenosis. Aorta: The aortic root is normal in size and structure. Venous: The inferior vena cava is normal in size with greater than 50% respiratory variability, suggesting right atrial pressure of 3 mmHg. IAS/Shunts: No atrial level shunt detected by color flow Doppler. Additional Comments: 3D was performed not requiring image post processing on an independent workstation and was normal.  LEFT VENTRICLE PLAX 2D LVIDd:         4.28 cm         Diastology LVIDs:         2.76 cm         LV e' medial:    7.72 cm/s LV PW:         0.99 cm         LV E/e' medial:  9.2 LV IVS:        0.97 cm         LV e' lateral:   9.79 cm/s LVOT diam:     2.00 cm         LV E/e' lateral: 7.2 LV SV:         68 LV SV Index:   37              2D Longitudinal LVOT Area:     3.14 cm        Strain                                2D Strain GLS   23.0 %                                (A4C):  2D Strain GLS   12.2 %                                (A3C):                                2D Strain GLS   18.1 %                                (A2C):                                2D Strain GLS   17.8 %                                Avg:                                 3D Volume EF                                LV 3D EF:    Left                                             ventricul                                             ar                                             ejection                                             fraction                                             by  3D                                             volume is                                             67 %.  3D Volume EF:                                3D EF:        67 %                                LV EDV:       92 ml                                LV ESV:       30 ml                                LV SV:        62 ml RIGHT VENTRICLE RV Basal diam:  2.60 cm RV Mid diam:    2.12 cm RV S prime:     13.10 cm/s TAPSE (M-mode): 1.8 cm LEFT ATRIUM             Index        RIGHT ATRIUM           Index LA diam:        2.90 cm 1.56 cm/m   RA Area:     10.60 cm LA Vol (A2C):   23.1 ml 12.44 ml/m  RA Volume:   22.90 ml  12.33 ml/m LA Vol (A4C):   34.0 ml 18.31 ml/m LA Biplane Vol: 28.1 ml 15.13 ml/m  AORTIC VALVE LVOT Vmax:   113.00 cm/s LVOT Vmean:  79.100 cm/s LVOT VTI:    0.216 m  AORTA Ao Root diam: 2.70 cm Ao Asc diam:  3.10 cm MITRAL VALVE MV Area (PHT): 3.91 cm     SHUNTS MV Decel Time: 194 msec     Systemic VTI:  0.22 m MV E velocity: 70.70 cm/s   Systemic Diam: 2.00 cm MV A velocity: 100.00 cm/s MV E/A ratio:  0.71 Dorothye Gathers MD Electronically signed by Dorothye Gathers MD Signature Date/Time: 12/02/2023/11:13:58 AM    Final      Assessment & Plan:   1. DOE (dyspnea on exertion)  2. COPD mixed type (HCC) (Primary)  3. Chronic respiratory failure with hypoxia (HCC)    Assessment and Plan    Chronic Obstructive Pulmonary Disease (COPD) with emphysema COPD with emphysema is well-managed. Shortness of breath is at baseline with no active cough or sputum production. Effective use of Stiolto inhaler and rare use of rescue inhaler. - Continue Stiolto two puffs every morning. - Continue supplemental oxygen  at two liters at rest and three liters with exertion.  Bronchiectasis: No active cough - HRCT scheduled for 5/7 and PFT scheduled for 6/5   Use of supplemental oxygen  Oxygen  therapy at two liters at rest and three liters with exertion is effective with no new  issues reported. - Continue current oxygen  therapy regimen.  Allergic rhinitis Managed with Flonase  and Claritin , but nasal congestion persists. - Continue Flonase  and Claritin  as prescribed.  Mild diastolic dysfunction Echocardiogram shows mild diastolic dysfunction with normal ejection fraction. BNP levels are normal, indicating no fluid overload. No acute heart failure symptoms present. Cardiology follow-up scheduled for July. - Proceed with cardiology appointment in July unless symptoms worsen.  Recording duration: 9 minutes  Antonio Baumgarten, NP 12/24/2023

## 2023-12-24 NOTE — Patient Instructions (Addendum)
  VISIT SUMMARY: Today, we discussed your ongoing management of COPD and emphysema, your use of supplemental oxygen , and your allergic rhinitis. We also reviewed your recent echocardiogram results and discussed your mild diastolic dysfunction.  YOUR PLAN: -CHRONIC OBSTRUCTIVE PULMONARY DISEASE (COPD) WITH EMPHYSEMA: COPD with emphysema is a chronic lung condition that makes it hard to breathe. Your condition is stable, and you should continue using your Stiolto inhaler with two puffs every morning and your supplemental oxygen  at two liters at rest and three liters with exertion.  -USE OF SUPPLEMENTAL OXYGEN : Your oxygen  therapy is helping you manage your breathing. Continue using oxygen  at two liters when you are resting and three liters when you are active.  -ALLERGIC RHINITIS: Allergic rhinitis is an allergic reaction that causes sneezing, congestion, and a runny nose. Continue using Flonase  and Claritin  as prescribed to manage your symptoms.  -MILD DIASTOLIC DYSFUNCTION: Mild diastolic dysfunction means your heart has a slight difficulty relaxing between beats, but your heart function is otherwise normal. Your BNP levels are normal, and you have no acute symptoms of heart failure. Continue with your scheduled cardiology appointment in July unless your symptoms worsen.  INSTRUCTIONS: Please continue with your current medications and oxygen  therapy as discussed. Make sure to attend your cardiology follow-up appointment in July. If you experience any worsening symptoms, contact our office immediately.  Follow-up CT chest May 7th Pulmonary function test and OV with Dr. Bertrum Brodie June 5th

## 2023-12-30 ENCOUNTER — Ambulatory Visit
Admission: RE | Admit: 2023-12-30 | Discharge: 2023-12-30 | Disposition: A | Discharge status: Home / Self Care | Source: Ambulatory Visit | Attending: Internal Medicine | Admitting: Internal Medicine

## 2023-12-30 DIAGNOSIS — Z87891 Personal history of nicotine dependence: Secondary | ICD-10-CM

## 2023-12-30 DIAGNOSIS — R9389 Abnormal findings on diagnostic imaging of other specified body structures: Secondary | ICD-10-CM

## 2023-12-30 DIAGNOSIS — R918 Other nonspecific abnormal finding of lung field: Secondary | ICD-10-CM | POA: Diagnosis not present

## 2023-12-30 DIAGNOSIS — I700 Atherosclerosis of aorta: Secondary | ICD-10-CM | POA: Diagnosis not present

## 2023-12-30 DIAGNOSIS — I2510 Atherosclerotic heart disease of native coronary artery without angina pectoris: Secondary | ICD-10-CM | POA: Diagnosis not present

## 2023-12-30 DIAGNOSIS — R0609 Other forms of dyspnea: Secondary | ICD-10-CM

## 2023-12-30 DIAGNOSIS — R942 Abnormal results of pulmonary function studies: Secondary | ICD-10-CM

## 2024-01-12 ENCOUNTER — Other Ambulatory Visit: Payer: Self-pay | Admitting: Family Medicine

## 2024-01-12 DIAGNOSIS — I10 Essential (primary) hypertension: Secondary | ICD-10-CM

## 2024-01-28 ENCOUNTER — Ambulatory Visit (INDEPENDENT_AMBULATORY_CARE_PROVIDER_SITE_OTHER): Admitting: Internal Medicine

## 2024-01-28 ENCOUNTER — Ambulatory Visit: Admitting: Internal Medicine

## 2024-01-28 ENCOUNTER — Encounter: Payer: Self-pay | Admitting: Internal Medicine

## 2024-01-28 VITALS — BP 121/78 | HR 102 | Ht 65.0 in | Wt 170.6 lb

## 2024-01-28 DIAGNOSIS — R0609 Other forms of dyspnea: Secondary | ICD-10-CM | POA: Diagnosis not present

## 2024-01-28 DIAGNOSIS — Z87891 Personal history of nicotine dependence: Secondary | ICD-10-CM

## 2024-01-28 DIAGNOSIS — J849 Interstitial pulmonary disease, unspecified: Secondary | ICD-10-CM

## 2024-01-28 DIAGNOSIS — R942 Abnormal results of pulmonary function studies: Secondary | ICD-10-CM

## 2024-01-28 DIAGNOSIS — J8410 Pulmonary fibrosis, unspecified: Secondary | ICD-10-CM

## 2024-01-28 DIAGNOSIS — J9611 Chronic respiratory failure with hypoxia: Secondary | ICD-10-CM | POA: Diagnosis not present

## 2024-01-28 DIAGNOSIS — I2510 Atherosclerotic heart disease of native coronary artery without angina pectoris: Secondary | ICD-10-CM | POA: Diagnosis not present

## 2024-01-28 DIAGNOSIS — Z122 Encounter for screening for malignant neoplasm of respiratory organs: Secondary | ICD-10-CM

## 2024-01-28 DIAGNOSIS — R9389 Abnormal findings on diagnostic imaging of other specified body structures: Secondary | ICD-10-CM

## 2024-01-28 DIAGNOSIS — J439 Emphysema, unspecified: Secondary | ICD-10-CM

## 2024-01-28 LAB — PULMONARY FUNCTION TEST
DL/VA % pred: 47 %
DL/VA: 1.92 ml/min/mmHg/L
DLCO unc % pred: 33 %
DLCO unc: 7.03 ml/min/mmHg
FEF 25-75 Post: 1.02 L/s
FEF 25-75 Pre: 0.88 L/s
FEF2575-%Change-Post: 16 %
FEF2575-%Pred-Post: 56 %
FEF2575-%Pred-Pre: 48 %
FEV1-%Change-Post: 3 %
FEV1-%Pred-Post: 73 %
FEV1-%Pred-Pre: 70 %
FEV1-Post: 1.73 L
FEV1-Pre: 1.67 L
FEV1FVC-%Change-Post: 1 %
FEV1FVC-%Pred-Pre: 87 %
FEV6-%Change-Post: 2 %
FEV6-%Pred-Post: 85 %
FEV6-%Pred-Pre: 83 %
FEV6-Post: 2.54 L
FEV6-Pre: 2.48 L
FEV6FVC-%Change-Post: 0 %
FEV6FVC-%Pred-Post: 103 %
FEV6FVC-%Pred-Pre: 103 %
FVC-%Change-Post: 2 %
FVC-%Pred-Post: 82 %
FVC-%Pred-Pre: 81 %
FVC-Post: 2.58 L
FVC-Pre: 2.53 L
Post FEV1/FVC ratio: 67 %
Post FEV6/FVC ratio: 98 %
Pre FEV1/FVC ratio: 66 %
Pre FEV6/FVC Ratio: 98 %
RV % pred: 87 %
RV: 2.12 L
TLC % pred: 86 %
TLC: 4.7 L

## 2024-01-28 NOTE — Progress Notes (Signed)
 Full pft performed today.

## 2024-01-28 NOTE — Patient Instructions (Signed)
 Full pft performed today.

## 2024-01-28 NOTE — Patient Instructions (Addendum)
 ICD-10-CM   1. Chronic respiratory failure with hypoxia (HCC)  J96.11     2. Combined pulmonary fibrosis and emphysema (CPFE) (HCC)  J43.9    J84.10     3. ILD (interstitial lung disease) (HCC)  J84.9     4. Pulmonary emphysema, unspecified emphysema type (HCC)  J43.9     5. Coronary artery calcification seen on CAT scan  I25.10     6. Former cigarette smoker  Z87.891     7. Screening for malignant neoplasm of respiratory organ  Z12.2      #ILD #emphysema #Chronic respiratory faiure  - You have combination emphysema and interstitial lung disease/pulmonary fibrosis -The burden of interstitial pulmonary fibrosis is extremely mild at this point. -Most of the disease burden is from emphysema .  Baseline is 2-3L of nasal cannula oxygen  at rest - Pulmonary function test stable over time  Plan - -Continue oxygen  as before - Continue all your medications as before -including oxygen  and Stiolto and Flonase  - For interstitial lung disease  - Given stability and mild burden and high risk for biopsy recommend monitoring approach  - However, will check serology blood work to determine risk for progression including hypersensitive pneumonitis profile  - Check alpha-1 antitrypsin phenotype - Repeat spirometry and DLCO in 6 months  Coronary artery calcification on CT scan  Plan -Visit with cardiology in July 2025  Lung cancer screening  Plan  - No more need to do low-dose CT scan of the chest but instead once a year we will do high-resolution CT chest [next 1 in June 2025 but we will order it in the future   Follow-up- 6 months with Dr. Bertrum Brodie nurse practitioner 15-minute visit but after pulmonary function testing
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
## 2024-02-04 LAB — ANA+ENA+DNA/DS+ANTICH+CENTR
ANA Titer 1: NEGATIVE
Anti JO-1: <0.2 AI (ref 0.0–0.9)
Centromere Ab Screen: <0.2 AI (ref 0.0–0.9)
Chromatin Ab SerPl-aCnc: <0.2 AI (ref 0.0–0.9)
ENA RNP Ab: <0.2 AI (ref 0.0–0.9)
ENA SM Ab Ser-aCnc: <0.2 AI (ref 0.0–0.9)
ENA SSA (RO) Ab: >8 AI — ABNORMAL HIGH (ref 0.0–0.9)
ENA SSB (LA) Ab: <0.2 AI (ref 0.0–0.9)
Scleroderma (Scl-70) (ENA) Antibody, IgG: <0.2 AI (ref 0.0–0.9)
dsDNA Ab: 15 [IU]/mL — ABNORMAL HIGH (ref 0–9)

## 2024-02-04 LAB — ANA: Anti Nuclear Antibody (ANA): NEGATIVE

## 2024-02-04 LAB — SJOGRENS SYNDROME-B EXTRACTABLE NUCLEAR ANTIBODY: SSB (La) (ENA) Antibody, IgG: <1 AI

## 2024-02-04 LAB — RHEUMATOID FACTOR: Rheumatoid fact SerPl-aCnc: <10 [IU]/mL

## 2024-02-04 LAB — ALPHA-1 ANTITRYPSIN PHENOTYPE: A-1 Antitrypsin, Ser: 176 mg/dL (ref 83–199)

## 2024-02-04 LAB — HYPERSENSITIVITY PNUEMONITIS PROFILE
ASPERGILLUS FUMIGATUS: NEGATIVE
Faenia retivirgula: NEGATIVE
Pigeon Serum: NEGATIVE
S. VIRIDIS: NEGATIVE
T. CANDIDUS: NEGATIVE
T. VULGARIS: NEGATIVE

## 2024-02-04 LAB — SJOGRENS SYNDROME-A EXTRACTABLE NUCLEAR ANTIBODY: SSA (Ro) (ENA) Antibody, IgG: >8 AI — AB

## 2024-02-04 LAB — ANTI-SCLERODERMA ANTIBODY: Scleroderma (Scl-70) (ENA) Antibody, IgG: <1 AI

## 2024-02-04 LAB — CYCLIC CITRUL PEPTIDE ANTIBODY, IGG: Cyclic Citrullin Peptide Ab: <16 U

## 2024-03-14 DIAGNOSIS — J069 Acute upper respiratory infection, unspecified: Secondary | ICD-10-CM | POA: Diagnosis not present

## 2024-03-14 DIAGNOSIS — I129 Hypertensive chronic kidney disease with stage 1 through stage 4 chronic kidney disease, or unspecified chronic kidney disease: Secondary | ICD-10-CM | POA: Diagnosis not present

## 2024-03-14 DIAGNOSIS — Z6828 Body mass index (BMI) 28.0-28.9, adult: Secondary | ICD-10-CM | POA: Diagnosis not present

## 2024-03-14 DIAGNOSIS — J9611 Chronic respiratory failure with hypoxia: Secondary | ICD-10-CM | POA: Diagnosis not present

## 2024-03-14 DIAGNOSIS — E663 Overweight: Secondary | ICD-10-CM | POA: Diagnosis not present

## 2024-03-14 DIAGNOSIS — N1831 Chronic kidney disease, stage 3a: Secondary | ICD-10-CM | POA: Diagnosis not present

## 2024-03-14 DIAGNOSIS — J449 Chronic obstructive pulmonary disease, unspecified: Secondary | ICD-10-CM | POA: Diagnosis not present
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
## 2024-03-25 ENCOUNTER — Encounter: Payer: Self-pay | Admitting: Cardiology

## 2024-03-25 ENCOUNTER — Ambulatory Visit: Attending: Cardiology | Admitting: Cardiology

## 2024-03-25 VITALS — BP 118/62 | HR 113 | Ht 65.0 in | Wt 165.6 lb

## 2024-03-25 DIAGNOSIS — I2510 Atherosclerotic heart disease of native coronary artery without angina pectoris: Secondary | ICD-10-CM | POA: Diagnosis not present

## 2024-03-25 DIAGNOSIS — I10 Essential (primary) hypertension: Secondary | ICD-10-CM | POA: Diagnosis not present

## 2024-03-25 DIAGNOSIS — E785 Hyperlipidemia, unspecified: Secondary | ICD-10-CM | POA: Diagnosis not present

## 2024-03-25 MED ORDER — ROSUVASTATIN CALCIUM 20 MG PO TABS: 20.0000 mg | ORAL_TABLET | Freq: Every day | ORAL | 3 refills | Status: AC

## 2024-03-25 NOTE — Patient Instructions (Signed)
 Medication Instructions:   START ROSUVASTATIN 20 MG ONCE DAILY  *If you need a refill on your cardiac medications before your next appointment, please call your pharmacy*  Lab Work:  Your physician recommends that you return for lab work in: 8 [facility]  If you have labs (blood work) drawn today and your tests are completely normal, you will receive your results only by: MyChart Message (if you have MyChart) OR A paper copy in the mail If you have any lab test that is abnormal or we need to change your treatment, we will call you to review the results.  Follow-Up: At [facility], you and your health needs are our priority.  As part of our continuing mission to provide you with exceptional heart care, our providers are all part of one team.  This team includes your primary Cardiologist (physician) and Advanced Practice Providers or APPs (Physician Assistants and Nurse Practitioners) who all work together to provide you with the care you need, when you need it.  Your next appointment:   12 month(s)  Provider:   REDELL SHALLOW MD

## 2024-04-08 DIAGNOSIS — E663 Overweight: Secondary | ICD-10-CM | POA: Diagnosis not present

## 2024-04-08 DIAGNOSIS — J439 Emphysema, unspecified: Secondary | ICD-10-CM | POA: Diagnosis not present

## 2024-04-08 DIAGNOSIS — J479 Bronchiectasis, uncomplicated: Secondary | ICD-10-CM | POA: Diagnosis not present

## 2024-04-08 DIAGNOSIS — I509 Heart failure, unspecified: Secondary | ICD-10-CM | POA: Diagnosis not present

## 2024-04-08 DIAGNOSIS — J9611 Chronic respiratory failure with hypoxia: Secondary | ICD-10-CM | POA: Diagnosis not present

## 2024-04-08 DIAGNOSIS — Z79899 Other long term (current) drug therapy: Secondary | ICD-10-CM | POA: Diagnosis not present

## 2024-04-08 DIAGNOSIS — I130 Hypertensive heart and chronic kidney disease with heart failure and stage 1 through stage 4 chronic kidney disease, or unspecified chronic kidney disease: Secondary | ICD-10-CM | POA: Diagnosis not present

## 2024-04-08 DIAGNOSIS — R7303 Prediabetes: Secondary | ICD-10-CM | POA: Diagnosis not present

## 2024-04-08 DIAGNOSIS — J449 Chronic obstructive pulmonary disease, unspecified: Secondary | ICD-10-CM | POA: Diagnosis not present

## 2024-04-08 DIAGNOSIS — I700 Atherosclerosis of aorta: Secondary | ICD-10-CM | POA: Diagnosis not present

## 2024-04-08 DIAGNOSIS — Z0001 Encounter for general adult medical examination with abnormal findings: Secondary | ICD-10-CM | POA: Diagnosis not present

## 2024-04-08 DIAGNOSIS — I70222 Atherosclerosis of native arteries of extremities with rest pain, left leg: Secondary | ICD-10-CM | POA: Diagnosis not present

## 2024-04-21 ENCOUNTER — Other Ambulatory Visit: Payer: Self-pay | Admitting: Internal Medicine

## 2024-05-09 ENCOUNTER — Other Ambulatory Visit: Payer: Self-pay | Admitting: Nurse Practitioner

## 2024-05-09 DIAGNOSIS — Z1231 Encounter for screening mammogram for malignant neoplasm of breast: Secondary | ICD-10-CM

## 2024-05-27 DIAGNOSIS — I10 Essential (primary) hypertension: Secondary | ICD-10-CM | POA: Diagnosis not present

## 2024-05-27 DIAGNOSIS — E785 Hyperlipidemia, unspecified: Secondary | ICD-10-CM | POA: Diagnosis not present

## 2024-05-28 ENCOUNTER — Ambulatory Visit: Payer: Self-pay | Admitting: Cardiology

## 2024-05-28 LAB — HEPATIC FUNCTION PANEL
ALT: 18 IU/L (ref 0–32)
AST: 24 IU/L (ref 0–40)
Albumin: 4.3 g/dL (ref 3.8–4.8)
Alkaline Phosphatase: 86 IU/L (ref 49–135)
Bilirubin Total: 0.4 mg/dL (ref 0.0–1.2)
Bilirubin, Direct: 0.15 mg/dL (ref 0.00–0.40)
Total Protein: 7.7 g/dL (ref 6.0–8.5)

## 2024-05-28 LAB — LIPID PANEL
Chol/HDL Ratio: 2.1 ratio (ref 0.0–4.4)
Cholesterol, Total: 131 mg/dL (ref 100–199)
HDL: 62 mg/dL (ref >39)
LDL Chol Calc (NIH): 52 mg/dL (ref 0–99)
Triglycerides: 92 mg/dL (ref 0–149)
VLDL Cholesterol Cal: 17 mg/dL (ref 5–40)

## 2024-05-31 DIAGNOSIS — Z23 Encounter for immunization: Secondary | ICD-10-CM | POA: Diagnosis not present

## 2024-06-02 NOTE — Telephone Encounter (Signed)
 Letter of results sent to pt

## 2024-06-23 ENCOUNTER — Ambulatory Visit
Admission: RE | Admit: 2024-06-23 | Discharge: 2024-06-23 | Disposition: A | Discharge status: Home / Self Care | Source: Ambulatory Visit | Attending: Nurse Practitioner | Admitting: Nurse Practitioner

## 2024-06-23 DIAGNOSIS — Z1231 Encounter for screening mammogram for malignant neoplasm of breast: Secondary | ICD-10-CM

## 2024-07-13 DIAGNOSIS — J449 Chronic obstructive pulmonary disease, unspecified: Secondary | ICD-10-CM | POA: Diagnosis not present

## 2024-07-13 DIAGNOSIS — Z1211 Encounter for screening for malignant neoplasm of colon: Secondary | ICD-10-CM | POA: Diagnosis not present

## 2024-08-01 ENCOUNTER — Inpatient Hospital Stay
Admission: RE | Admit: 2024-08-01 | Discharge: 2024-08-01 | Disposition: A | Source: Ambulatory Visit | Attending: Acute Care | Admitting: Acute Care

## 2024-08-01 DIAGNOSIS — Z87891 Personal history of nicotine dependence: Secondary | ICD-10-CM

## 2024-08-01 DIAGNOSIS — Z122 Encounter for screening for malignant neoplasm of respiratory organs: Secondary | ICD-10-CM

## 2024-08-03 ENCOUNTER — Other Ambulatory Visit: Payer: Self-pay | Admitting: Gastroenterology

## 2024-08-04 ENCOUNTER — Other Ambulatory Visit: Payer: Self-pay

## 2024-08-04 DIAGNOSIS — Z87891 Personal history of nicotine dependence: Secondary | ICD-10-CM

## 2024-08-04 DIAGNOSIS — Z122 Encounter for screening for malignant neoplasm of respiratory organs: Secondary | ICD-10-CM

## 2024-08-05 ENCOUNTER — Ambulatory Visit: Admitting: Internal Medicine

## 2024-08-08 ENCOUNTER — Encounter

## 2024-08-08 ENCOUNTER — Ambulatory Visit: Admitting: Primary Care

## 2024-08-17 ENCOUNTER — Ambulatory Visit: Payer: Self-pay | Admitting: Internal Medicine

## 2024-08-17 NOTE — Progress Notes (Signed)
 The antibiody profile suggests you might have a coindition called Sjogren which can cause drying of the skin, eyes and also scarring of lungs. Do talk about this with nurse practtioner at visit. Likel will need rheumtalogy referral. Not urgent

## 2024-08-26 ENCOUNTER — Other Ambulatory Visit: Payer: Self-pay

## 2024-08-26 ENCOUNTER — Encounter (HOSPITAL_COMMUNITY): Payer: Self-pay | Admitting: Gastroenterology

## 2024-08-26 NOTE — Progress Notes (Signed)
 Attempted to obtain medical history via telephone, unable to reach at this time. HIPAA compliant voicemail message left requesting return call to pre surgical testing department.

## 2024-08-26 NOTE — Progress Notes (Addendum)
 Date of COVID positive in last 90 days:  No  PCP - Daphne Lesches, NP Cardiologist - Redell Shallow, MD Pulmonologist - Dorethia Cave, MD  Chest x-ray - CT chest 08-01-24 Epic EKG - 03-25-24 Epic Stress Test - 09-05-14 Epic ECHO - 12-02-23 Epic Cardiac Cath - N/A Pacemaker/ICD device last checked:N/A Spinal Cord Stimulator:N/A  Bowel Prep - Yes, patient has instructions.  Has to pick up prep  Sleep Study - N/A CPAP -   Fasting Blood Sugar - N/A Checks Blood Sugar _____ times a day  Last dose of GLP1 agonist-  N/A GLP1 instructions:  Do not take after     Last dose of SGLT-2 inhibitors-  N/A SGLT-2 instructions:  Do not take after     Blood Thinner Instructions: N/A Last dose:   Time: Aspirin Instructions:N/A Last Dose:  Activity level:  Can go up a flight of stairs and perform activities of daily living without stopping and without symptoms of chest pain.  Patient has SOB with exertion, feels that she is at her baseline, has not worsened.  Patient lives alone.  Anesthesia review: COPD, ILD, uses O2 2-3 L.  Uses 2 L to sleep and 3 L with exertion. (Chart reviewed by Burnard Senna, PA-C)  Patient denies shortness of breath, fever, cough and chest pain at PAT appointment  Patient verbalized understanding of instructions that were given to them at the PAT appointment. Patient was also instructed that they will need to review over the PAT instructions again at home before surgery.

## 2024-08-31 ENCOUNTER — Other Ambulatory Visit: Payer: Self-pay | Admitting: Nurse Practitioner

## 2024-08-31 ENCOUNTER — Ambulatory Visit
Admission: RE | Admit: 2024-08-31 | Discharge: 2024-08-31 | Disposition: A | Discharge status: Home / Self Care | Payer: PRIVATE HEALTH INSURANCE | Source: Ambulatory Visit | Attending: Nurse Practitioner | Admitting: Nurse Practitioner

## 2024-08-31 DIAGNOSIS — I209 Angina pectoris, unspecified: Secondary | ICD-10-CM

## 2024-09-09 ENCOUNTER — Encounter (HOSPITAL_COMMUNITY): Payer: Self-pay | Admitting: Registered Nurse

## 2024-09-09 ENCOUNTER — Other Ambulatory Visit: Payer: Self-pay

## 2024-09-09 ENCOUNTER — Encounter (HOSPITAL_COMMUNITY): Admission: RE | Disposition: A | Payer: Self-pay | Source: Home / Self Care | Attending: Gastroenterology

## 2024-09-09 ENCOUNTER — Ambulatory Visit (HOSPITAL_COMMUNITY): Payer: Self-pay | Admitting: Registered Nurse

## 2024-09-09 ENCOUNTER — Ambulatory Visit (HOSPITAL_COMMUNITY)
Admission: RE | Admit: 2024-09-09 | Discharge: 2024-09-09 | Disposition: A | Discharge status: Home / Self Care | Attending: Gastroenterology | Admitting: Gastroenterology

## 2024-09-09 DIAGNOSIS — J449 Chronic obstructive pulmonary disease, unspecified: Secondary | ICD-10-CM

## 2024-09-09 DIAGNOSIS — Z8249 Family history of ischemic heart disease and other diseases of the circulatory system: Secondary | ICD-10-CM | POA: Insufficient documentation

## 2024-09-09 DIAGNOSIS — I10 Essential (primary) hypertension: Secondary | ICD-10-CM | POA: Insufficient documentation

## 2024-09-09 DIAGNOSIS — D175 Benign lipomatous neoplasm of intra-abdominal organs: Secondary | ICD-10-CM | POA: Insufficient documentation

## 2024-09-09 DIAGNOSIS — D125 Benign neoplasm of sigmoid colon: Secondary | ICD-10-CM | POA: Insufficient documentation

## 2024-09-09 DIAGNOSIS — Z87891 Personal history of nicotine dependence: Secondary | ICD-10-CM | POA: Diagnosis not present

## 2024-09-09 DIAGNOSIS — Z1211 Encounter for screening for malignant neoplasm of colon: Secondary | ICD-10-CM

## 2024-09-09 DIAGNOSIS — K633 Ulcer of intestine: Secondary | ICD-10-CM | POA: Insufficient documentation

## 2024-09-09 DIAGNOSIS — D122 Benign neoplasm of ascending colon: Secondary | ICD-10-CM

## 2024-09-09 DIAGNOSIS — I2510 Atherosclerotic heart disease of native coronary artery without angina pectoris: Secondary | ICD-10-CM | POA: Diagnosis not present

## 2024-09-09 DIAGNOSIS — Z9981 Dependence on supplemental oxygen: Secondary | ICD-10-CM | POA: Insufficient documentation

## 2024-09-09 DIAGNOSIS — K5520 Angiodysplasia of colon without hemorrhage: Secondary | ICD-10-CM | POA: Insufficient documentation

## 2024-09-09 DIAGNOSIS — K5730 Diverticulosis of large intestine without perforation or abscess without bleeding: Secondary | ICD-10-CM | POA: Insufficient documentation

## 2024-09-09 DIAGNOSIS — K219 Gastro-esophageal reflux disease without esophagitis: Secondary | ICD-10-CM | POA: Insufficient documentation

## 2024-09-09 DIAGNOSIS — Z860101 Personal history of adenomatous and serrated colon polyps: Secondary | ICD-10-CM | POA: Diagnosis present

## 2024-09-09 DIAGNOSIS — K635 Polyp of colon: Secondary | ICD-10-CM | POA: Diagnosis not present

## 2024-09-09 HISTORY — PX: BIOPSY OF SKIN SUBCUTANEOUS TISSUE AND/OR MUCOUS MEMBRANE: SHX6741

## 2024-09-09 HISTORY — DX: Prediabetes: R73.03

## 2024-09-09 HISTORY — PX: COLONOSCOPY: SHX5424

## 2024-09-09 HISTORY — PX: POLYPECTOMY: SHX149

## 2024-09-09 MED ORDER — PROPOFOL 500 MG/50ML IV EMUL
INTRAVENOUS | Status: AC
  Filled 2024-09-09: qty 50

## 2024-09-09 MED ORDER — SODIUM CHLORIDE 0.9 % IV SOLN: INTRAVENOUS | Status: DC

## 2024-09-09 MED ORDER — PROPOFOL 500 MG/50ML IV EMUL
INTRAVENOUS | Status: DC | PRN
  Administered 2024-09-09: 135 ug/kg/min via INTRAVENOUS
  Administered 2024-09-09 (×2): 20 mg via INTRAVENOUS

## 2024-09-09 MED ORDER — LIDOCAINE 2% (20 MG/ML) 5 ML SYRINGE
INTRAMUSCULAR | Status: DC | PRN
  Administered 2024-09-09: 60 mg via INTRAVENOUS

## 2024-09-09 NOTE — Op Note (Signed)
 [facility] Patient Name: Nicole Spence Procedure Date: 09/09/2024 MRN: [id number] Attending MD: Belvie Just , MD, [id number] Date of Birth: 1946/01/08 CSN: [id number] Age: 79 Admit Type: Ambulatory Procedure:                Colonoscopy Indications:              High risk colon cancer surveillance: Personal                            history of colonic polyps Providers:                Belvie Just, MD, Haskel Chris, Technician,                            Willy Hummer, RN Referring MD:              Medicines:                Propofol  per Anesthesia Complications:            No immediate complications. Estimated Blood Loss:     Estimated blood loss: none. Procedure:                Pre-Anesthesia Assessment:                           - Prior to the procedure, a History and Physical                            was performed, and patient medications and                            allergies were reviewed. The patient's tolerance of                            previous anesthesia was also reviewed. The risks                            and benefits of the procedure and the sedation                            options and risks were discussed with the patient.                            All questions were answered, and informed consent                            was obtained. Prior Anticoagulants: The patient has                            taken no anticoagulant or antiplatelet agents. ASA                            Grade Assessment: III - A patient with severe                            systemic  disease. After reviewing the risks and                            benefits, the patient was deemed in satisfactory                            condition to undergo the procedure.                           - Sedation was administered by an anesthesia                            professional. Deep sedation was attained.                           After obtaining informed consent, the  colonoscope                            was passed under direct vision. Throughout the                            procedure, the patient's blood pressure, pulse, and                            oxygen  saturations were monitored continuously. The                            CF-HQ190L ([id number]) Olympus colonoscope was                            introduced through the anus and advanced to the the                            terminal ileum. The colonoscopy was performed                            without difficulty. The patient tolerated the                            procedure well. The quality of the bowel                            preparation was evaluated using the BBPS [facility]                            Bowel Preparation Scale) with scores of: Right                            Colon = 3, Transverse Colon = 3 and Left Colon = 3                            (entire mucosa seen well with no residual staining,  small fragments of stool or opaque liquid). The                            total BBPS score equals 9. The terminal ileum,                            ileocecal valve, appendiceal orifice, and rectum                            were photographed. Scope In: 11:47:16 AM Scope Out: 12:01:51 PM Scope Withdrawal Time: 0 hours 9 minutes 43 seconds  Total Procedure Duration: 0 hours 14 minutes 35 seconds  Findings:      Two sessile polyps were found in the sigmoid colon and ascending colon.       The polyps were 3 to 4 mm in size. These polyps were removed with a cold       snare. Resection and retrieval were complete.      Two large localized angiodysplastic lesions without bleeding were found       in the cecum.      Scattered large-mouthed and medium-mouthed diverticula were found in the       entire colon.      There was a medium-sized lipoma, in the ascending colon.      A single (solitary) ten mm ulcer was found at the ileocecal valve. No       bleeding was present.  No stigmata of recent bleeding were seen. Biopsies       were taken with a cold forceps for histology. Impression:               - Two 3 to 4 mm polyps in the sigmoid colon and in                            the ascending colon, removed with a cold snare.                            Resected and retrieved.                           - Two non-bleeding colonic angiodysplastic lesions.                           - Diverticulosis in the entire examined colon.                           - Medium-sized lipoma in the ascending colon.                           - A single (solitary) ulcer at the ileocecal valve.                            Biopsied. Moderate Sedation:      Not Applicable - Patient had care per Anesthesia. Recommendation:           - Patient has a contact number available for  emergencies. The signs and symptoms of potential                            delayed complications were discussed with the                            patient. Return to normal activities tomorrow.                            Written discharge instructions were provided to the                            patient.                           - Resume regular diet.                           - Continue present medications.                           - Await pathology results.                           - Repeat colonoscopy is not recommended for                            surveillance with the current findings, age, and                            significant comorbidities. Procedure Code(s):        --- Professional ---                           [id number], Colonoscopy, flexible; with removal of                            tumor(s), polyp(s), or other lesion(s) by snare                            technique                           45380, 59, Colonoscopy, flexible; with biopsy,                            single or multiple Diagnosis Code(s):        --- Professional ---                           Z86.010,  Personal history of colonic polyps                           D12.5, Benign neoplasm of sigmoid colon                           D12.2, Benign neoplasm of ascending colon  K55.20, Angiodysplasia of colon without hemorrhage                           D17.5, Benign lipomatous neoplasm of                            intra-abdominal organs                           K63.3, Ulcer of intestine                           K57.30, Diverticulosis of large intestine without                            perforation or abscess without bleeding CPT copyright 2022 American Medical Association. All rights reserved. The codes documented in this report are preliminary and upon coder review may  be revised to meet current compliance requirements. Belvie Just, MD Belvie Just, MD 09/09/2024 12:14:52 PM This report has been signed electronically. Number of Addenda: 0

## 2024-09-09 NOTE — Discharge Instructions (Signed)
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
## 2024-09-09 NOTE — Anesthesia Postprocedure Evaluation (Signed)
"   Anesthesia Post Note  Patient: Nicole Spence  Procedure(s) Performed: COLONOSCOPY POLYPECTOMY, INTESTINE BIOPSY, SKIN, SUBCUTANEOUS TISSUE, OR MUCOUS MEMBRANE     Patient location during evaluation: PACU Anesthesia Type: MAC Level of consciousness: awake and alert Pain management: pain level controlled Vital Signs Assessment: post-procedure vital signs reviewed and stable Respiratory status: spontaneous breathing, nonlabored ventilation, respiratory function stable and patient connected to nasal cannula oxygen  Cardiovascular status: stable and blood pressure returned to baseline Postop Assessment: no apparent nausea or vomiting Anesthetic complications: no   No notable events documented.  Last Vitals:  Vitals:   09/09/24 1220 09/09/24 1230  BP: (!) (P) 120/55 (P) 121/63  Pulse: 87   Resp: 16   Temp:    SpO2: 98%     Last Pain:  Vitals:   09/09/24 1220  TempSrc:   PainSc: (P) 0-No pain                 [name]      "

## 2024-09-09 NOTE — Anesthesia Procedure Notes (Signed)
 Procedure Name: MAC Date/Time: 09/09/2024 11:40 AM  Performed by: Memory Armida LABOR, CRNAPre-anesthesia Checklist: Patient identified, Emergency Drugs available, Suction available, Patient being monitored and Timeout performed Patient Re-evaluated:Patient Re-evaluated prior to induction Oxygen  Delivery Method: Simple face mask Placement Confirmation: positive ETCO2 Tube secured with: Tape Dental Injury: Teeth and Oropharynx as per pre-operative assessment

## 2024-09-09 NOTE — Anesthesia Preprocedure Evaluation (Signed)
"                                    Anesthesia Evaluation  Patient identified by MRN, date of birth, ID band Patient awake    Reviewed: Allergy & Precautions, NPO status , Patient's Chart, lab work & pertinent test results  History of Anesthesia Complications Negative for: history of anesthetic complications  Airway Mallampati: II  TM Distance: >3 FB Neck ROM: Full    Dental no notable dental hx. (+) Teeth Intact   Pulmonary neg sleep apnea, COPD,  COPD inhaler and oxygen  dependent, Patient abstained from smoking.Not current smoker, former smoker 2-3L/min O2 constantly   Pulmonary exam normal breath sounds clear to auscultation       Cardiovascular Exercise Tolerance: Good METShypertension, Pt. on medications + CAD  (-) Past MI Normal cardiovascular exam(-) dysrhythmias  Rhythm:Regular Rate:Normal - Systolic murmurs  Stress test 7983: Low risk stress nuclear study with normal perfusion and reduced aerobic capacity with an exaggerrated BP response to exercise. NL LV Function, EF 81% post stress; NL Wall Motion  Echocardiogram April 2025 showed normal LV function, grade 1 diastolic dysfunction.  Chest CT May 2025 showed interstitial pneumonitis superimposed on emphysema, aortic atherosclerosis and coronary calcification.     Neuro/Psych negative neurological ROS  negative psych ROS   GI/Hepatic Neg liver ROS,GERD  Medicated,,  Endo/Other  negative endocrine ROSneg diabetes    Renal/GU negative Renal ROS  negative genitourinary   Musculoskeletal negative musculoskeletal ROS (+)    Abdominal   Peds  Hematology negative hematology ROS (+)   Anesthesia Other Findings Past Medical History: No date: Cataract 02/09/2007: Colon polyps     Comment:  Hung; colonoscopy. No date: COPD (chronic obstructive pulmonary disease) (HCC) 02/09/2007: Diverticula of colon No date: Glucose intolerance (impaired glucose tolerance) No date: Hypertension 02/09/2007:  Internal hemorrhoids 07/2018: Pneumonia No date: Pre-diabetes  Reproductive/Obstetrics                              Anesthesia Physical Anesthesia Plan  ASA: 4  Anesthesia Plan: MAC   Post-op Pain Management: Minimal or no pain anticipated   Induction: Intravenous  PONV Risk Score and Plan: 2 and Propofol  infusion, TIVA, Treatment may vary due to age or medical condition and Ondansetron   Airway Management Planned: Natural Airway, Nasal Cannula and Simple Face Mask  Additional Equipment: None  Intra-op Plan:   Post-operative Plan:   Informed Consent: I have reviewed the patients History and Physical, chart, labs and discussed the procedure including the risks, benefits and alternatives for the proposed anesthesia with the patient or authorized representative who has indicated his/her understanding and acceptance.     Dental advisory given  Plan Discussed with: CRNA and Surgeon  Anesthesia Plan Comments: (Discussed risks of anesthesia with patient, including possibility of difficulty with spontaneous ventilation under anesthesia necessitating airway intervention, PONV, and rare risks such as cardiac or respiratory or neurological events, and allergic reactions. Discussed the role of CRNA in patient's perioperative care. Patient understands. Patient counseled on being higher risk for anesthesia due to comorbidities: O2-dependent COPD. Patient was told about increased risk of cardiac and respiratory events, including death. )         Anesthesia Quick Evaluation  "

## 2024-09-09 NOTE — Transfer of Care (Signed)
 Immediate Anesthesia Transfer of Care Note  Patient: Nicole Spence  Procedure(s) Performed: COLONOSCOPY POLYPECTOMY, INTESTINE BIOPSY, SKIN, SUBCUTANEOUS TISSUE, OR MUCOUS MEMBRANE  Patient Location: PACU and Endoscopy Unit  Anesthesia Type:MAC  Level of Consciousness: drowsy and patient cooperative  Airway & Oxygen  Therapy: Patient Spontanous Breathing and Patient connected to face mask oxygen   Post-op Assessment: Report given to RN, Post -op Vital signs reviewed and stable, and Patient moving all extremities  Post vital signs: Reviewed and stable  Last Vitals:  Vitals Value Taken Time  BP 99/42 09/09/24 12:07  Temp    Pulse 80 09/09/24 12:08  Resp 16 09/09/24 12:08  SpO2 100 % 09/09/24 12:08  Vitals shown include unfiled device data.  Last Pain:  Vitals:   09/09/24 0849  TempSrc: Temporal  PainSc: 0-No pain         Complications: No notable events documented.

## 2024-09-11 ENCOUNTER — Encounter (HOSPITAL_COMMUNITY): Payer: Self-pay | Admitting: Gastroenterology

## 2024-09-12 LAB — SURGICAL PATHOLOGY

## 2024-09-21 NOTE — Progress Notes (Unsigned)
 "  @Patient  ID: Nicole Spence, female    DOB: [birth year], 79 y.o.   MRN: [id number]  No chief complaint on file.   Referring provider: Arloa Jarvis, NP  HPI: 79 year old female, former smoker.  Past medical history significant for hypertension, COPD Gold 0, chronic respiratory failure with hypoxia, community-acquired pneumonia, allergic rhinitis, GERD, today.  Patient of Dr. Darlean.   Previous [facility] pulmonary encounter:  10/07/2023 Discussed the use of AI scribe software for clinical note transcription with the patient, who gave verbal consent to proceed.  History of Present Illness   Nicole Spence is a 79 year old female with COPD/emphysema and bronchiectasis who presents for oxygen  qualification and equipment update.   Patient presents today to qualify for portable oxygen  concentrator.  He has been on oxygen  as far back as 2019.  Uses 2 L of oxygen  as needed with exertion. Current oxygen  supplier is [employer]. She is looking to change from [employer] to [employer] for both a home concentrator and a new portable concentrator. She previously used a insurance underwriter provided by [employer], which was taken back, and has since purchased a portable concentrator independently. She has been in contact with [employer], and the necessary paperwork has been sent by a representative named Sunny.  She typically uses two liters of oxygen  while sleeping and increases to three liters during activities such as housework. When out in the community, she uses a portable concentrator set at three liters. There have been no significant changes in her breathing recently.  In December 2024, she underwent a lung cancer screening which showed emphysema and bronchiectasis, with no suspicious pulmonary nodules. The bronchiectasis has worsened since 2022. She mainly experiences shortness of breath without a significant cough, and her sinus issues are more troublesome than her lung symptoms. She has an apt with Dr. Geronimo later this month for  evaluation.   She uses Flonase  and Claritin  for rhinitis and is on Stiolto for COPD/emphysema. She also has a flutter valve to help with congestion, although she rarely feels congested unless she has a severe cold.    10/07/2023 Discussed the use of AI scribe software for clinical note transcription with the patient, who gave verbal consent to proceed.  History of Present Illness   Nicole Spence is a 80 year old female with COPD/emphysema and bronchiectasis who presents for oxygen  qualification and equipment update.   Patient presents today to qualify for portable oxygen  concentrator.  He has been on oxygen  as far back as 2019.  Uses 2 L of oxygen  as needed with exertion. Current oxygen  supplier is [employer]. She is looking to change from [employer] to [employer] for both a home concentrator and a new portable concentrator. She previously used a insurance underwriter provided by [employer], which was taken back, and has since purchased a portable concentrator independently. She has been in contact with [employer], and the necessary paperwork has been sent by a representative named Sunny.  She typically uses two liters of oxygen  while sleeping and increases to three liters during activities such as housework. When out in the community, she uses a portable concentrator set at three liters. There have been no significant changes in her breathing recently.  In December 2024, she underwent a lung cancer screening which showed emphysema and bronchiectasis, with no suspicious pulmonary nodules. The bronchiectasis has worsened since 2022. She mainly experiences shortness of breath without a significant cough, and her sinus issues are more troublesome than her lung symptoms. She has an apt with Dr. Geronimo later this  month for evaluation.   She uses Flonase  and Claritin  for rhinitis and is on Stiolto for COPD/emphysema. She also has a flutter valve to help with congestion, although she rarely feels congested unless she has a severe  cold.      OV 11/04/2023 transfer of care from Dr. Ozell America to Dr. Geronimo over concerns of interstitial lung disease.  Subjective:  Patient ID: Nicole Spence, female , DOB: [birth year] , age 69 y.o. , MRN: [id number] , ADDRESS: [address] [place] [id number] PCP Tanda Bleacher, MD Patient Care Team: Tanda Bleacher, MD as PCP - General (Family Medicine) Octavia Charleston, MD as Consulting Physician (Ophthalmology) America Ozell NOVAK, MD as Consulting Physician (Pulmonary Disease)  This Provider for this visit: Treatment Team:  Attending Provider: Geronimo Amel, MD  11/04/2023 -   Chief Complaint  Patient presents with   Follow-up    ILD eval. She states her breathing has been stable. She has had dry cough over the past wk- relates to pollen.    Nicole Spence 79 y.o. -she is a former smoker having quit 10 years ago.  Over 30 pack smoking history.  She tells me that she was placed on oxygen  approximately 4 to 5 years ago following hospitalization.  Was not COVID but she says diagnosed with COPD.  Since then she uses 2 L nasal cannula at rest with 3 L exertion.  This has been stable.  Overall shortness of breath symptom score is below and has had no change.  Her cough is very minimal.  Last seen by Dr. Ozell America in November 2024.  Then in December 2020.  Lauraine Schultze for lung cancer screening.  On the CT scan of the chest was abnormal.  There was some suggestion of ILD.  She saw Almarie Ferrari nurse practitioner 10/07/2023 and there was concerns of ILD along with known emphysema.  Also bronchiectasis reported..  She is on Stiolto.  She is on oxygen .  Overall no hospitalizations.  Only new interim problem in the last year or 2 his neuropathy.  This has been documented in the chart.  No surgeries otherwise.  I personally visualized the CT chest and agree with the findings although she might just have interstitial lung abnormality and not ILD.  She is agreeable for workup around this.  Her  last pulmonary function test was 2022.  Of note she did desaturate on room air at rest to 83%. Prior pulmonary function test reviewed was several years ago and had isolated reduction diffusion capacity.   Shortness of breath Chronic hypoxemic respiratory failure Abnormal pulmonary function test Abnormal CT scan of the chest   -Low-dose CT scan of the chest while ruling out lung cancer suggesting interstitial lung disease -But glad to know that you are chronically stable.  Years on 2-lead 3 L of nasal cannula oxygen  at rest   Plan - Do full pulmonary function test - Do high-resolution CT chest             -Might need some additional blood work based on results - Check blood BNP - Hold off on CBC and chemistry check because based on a history of getting it checked with your primary care - Continue oxygen  as before - Continue all your medications as before     Coronary artery calcification on CT scan   Plan - Do echocardiogram - Refer cardiology -Check blood BNP     Follow-up- -8 weeks Dr. Geronimo but after completing all of the tests above  12/24/2023 Discussed the use of AI scribe software for clinical note transcription with the patient, who gave verbal consent to proceed.  Patient presents today for follow-up.  She was last seen by Dr. Geronimo in March and ordered for several tests.  High-resolution CAT scan is scheduled for May 7 and pulmonary function testing scheduled for June 5.  Echocardiogram showed normal ejection fraction 65 to 70% with grade 1 diastolic dysfunction.  BNP was normal at 19.  She is not been set up with an appointment to see cardiology.  History of Present Illness   Nicole Spence is a 79 year old female with COPD and emphysema who presents for a follow-up regarding shortness of breath.  She experiences stable shortness of breath, which has not improved or worsened. She uses supplemental oxygen  at two liters at rest and three liters with  exertion, and she wears it at night. No active cough or sputum production is present, but she mentions sinus drainage due to pollen. She describes occasional chest sensations that feel like a cramp, which 'eases on out'.  She uses a Stiolto inhaler, two puffs in the morning, for her COPD and emphysema, and rarely uses her rescue inhaler. She also uses Flonase  and Claritin  for allergic rhinitis, although her nasal passages remain clogged despite a change to a single nasal spray.  A recent echocardiogram showed normal heart function with mild diastolic dysfunction. Her BNP lab test was normal.  In terms of daily activities, she rates her shortness of breath as zero at rest, two during simple tasks, three to four during household tasks, four while shopping, three when walking on a level surface, and five when walking up stairs. No cough, nausea, vomiting, diarrhea, anxiety, or depression, but she does experience fatigue and joint pain.       01/28/2024 -   Chief Complaint  Patient presents with   Follow-up    F/u ct,pft,echo   #Known emphysema #Interstitial lung disease workup in progress #Chronic respiratory failure 2 L due to above #Coronary artery calcification on CT appointment pending with cardiology July 2025 #Screening for lung cancer  - High risk CT chest May 2025 without lung cancer  HPI Nicole Spence 79 y.o. -returns for follow-up.  Since her last visit she reports some tickle in the throat that is mild is on and off associated with sinus drainage no clear-cut aggravating or relieving factors extremely mild.  Otherwise Interim Health status: No new complaints No new medical problems. No new surgeries. No ER visits. No Urgent care visits. No changes to medications  Here to discuss test results.  CT chest shows extremely mild ILD.  I personally visualized it.  Her pulmonary function test is stable.  Predominant disease pattern is emphysema.  In terms of her coronary artery calcification  cardiology appointment is pending. Overall stable.     09/22/2024- Interim hx  Discussed the use of AI scribe software for clinical note transcription with the patient, who gave verbal consent to proceed.  History of Present Illness Nicole Spence is a 79 year old female with emphysema, interstitial lung disease, and chronic respiratory failure who presents for a follow-up visit. She was referred by Dr. Geronimo for follow-up of her respiratory conditions.  She has a history of emphysema, interstitial lung disease, and chronic respiratory failure, requiring supplemental oxygen . She adjusts her oxygen  flow between 2-3 liters based on activity level, using it during exertion and at night, but often turning it off at rest. Her home concentrator  is set at 2 liters, increasing to 3 liters during exertion.  She uses the inhaler Stiolto for emphysema management. Her respiratory symptoms have remained stable since her last visit in June. She experiences shortness of breath rated as zero at rest, two during simple tasks, and zero during household tasks with oxygen . Without oxygen , her shortness of breath increases to three, and she experiences a level of three while shopping, walking at her own pace, and climbing stairs.  She denies cough, nausea, vomiting, diarrhea, anxiety, and depression. She reports mild fatigue, rated as one, and chronic pain due to arthritis in her shoulders and legs, as well as sciatica. She suspects tendinitis in her shoulder, which has been a long-standing issue.  Recent pulmonary function tests indicate a slight decrease in lung function, with a 2% decrease in FEV1 since June and a 5% decrease in diffusion capacity. Compared to 2022, there is a 16% decrease in FEV1. Serology testing suggested possible Sjogren's syndrome.  Pulmonary function testing 09/22/2024 >> FVC 3.09 (76%), FEV1 2.32 (68%), ratio 67, DLCOunc 20.70(28%)  Moderate obstruction with severe diffusion defect     SYMPTOM SCALE - ILD 11/04/2023 09/22/2024  Current weight    O2 use RA - 83% at rest, She uses 2-3L [place] at rest 3L POC  Shortness of Breath 0 -> 5 scale with 5 being worst (score 6 If unable to do)   At rest 1 0  Simple tasks - showers, clothes change, eating, shaving 1 2  Household (dishes, doing bed, laundry) 2 0 - with oxygen / 3- without  Shopping 3 3  Walking level at own pace 2 3  Walking up Stairs 2 3  Total (30-36) Dyspnea Score 11 8  How bad is your cough? 1 0  How bad is your fatigue 2 1  How bad is nausea 0 0  How bad is vomiting?  0 0  How bad is diarrhea? 0 0  How bad is anxiety? 1 0  How bad is depression 1 0  Any chronic pain - if so where and how bad 0 Arthritic pain shoulder and legs and sciatic     Allergies[1]  Immunization History  Administered Date(s) Administered   Fluad Quad(high Dose 65+) 05/11/2019, 05/16/2021   INFLUENZA, HIGH DOSE SEASONAL PF 09/20/2018, 05/24/2020   Influenza,inj,Quad PF,6+ Mos 05/04/2014, 09/21/2015, 06/17/2016   Influenza-Unspecified 04/26/2019, 05/26/2023   PFIZER(Purple Top)SARS-COV-2 Vaccination 10/15/2019, 11/08/2019, 05/24/2020, 11/23/2020   PNEUMOCOCCAL CONJUGATE-20 01/24/2021   Pfizer Covid-19 Vaccine Bivalent Booster 5y-11y 05/16/2021   Pneumococcal Conjugate-13 01/25/2014   Pneumococcal Polysaccharide-23 03/14/2015   Td 03/17/2017   Tdap 12/24/2006   Unspecified SARS-COV-2 Vaccination 05/13/2023   Zoster Recombinant(Shingrix) 03/25/2021   Zoster, Live 05/26/2014    Past Medical History:  Diagnosis Date   Cataract    Colon polyps 02/09/2007   [facility]; colonoscopy.   COPD (chronic obstructive pulmonary disease) (HCC)    Diverticula of colon 02/09/2007   Glucose intolerance (impaired glucose tolerance)    Hypertension    Internal hemorrhoids 02/09/2007   Pneumonia 07/2018   Pre-diabetes     Tobacco History: Tobacco Use History[2] Counseling given: Not Answered   Outpatient Medications Prior to Visit   Medication Sig Dispense Refill   aspirin 81 MG chewable tablet Chew 81 mg by mouth daily.     Emollient (COLLAGEN EX) Apply topically. 4 scoops daily of collagen mixed in liquid     famotidine  (PEPCID ) 20 MG tablet TAKE 1 TABLET BY MOUTH TWICE  DAILY AFTER BREAKFAST AND  AFTER  SUPPER 180 tablet 3   fluticasone  (FLONASE ) 50 MCG/ACT nasal spray USE 2 SPRAYS IN BOTH NOSTRILS  DAILY (Patient taking differently: 1 spray daily.) 48 g 1   hydrochlorothiazide  (HYDRODIURIL ) 25 MG tablet TAKE 1 TABLET BY MOUTH DAILY 90 tablet 3   loratadine  (CLARITIN ) 10 MG tablet Take 1 tablet (10 mg total) by mouth daily. 30 tablet 11   Multiple Vitamins-Minerals (WOMENS 50+ MULTI VITAMIN/MIN) TABS Take 1 tablet by mouth daily.     OVER THE COUNTER MEDICATION Procaps circulation and vein support 2 capsules daily     OXYGEN  Inhale 2-3 L into the lungs See admin instructions. 2lpm with sleep and 3lpm with exertion     Polyvinyl Alcohol-Povidone PF (REFRESH) 1.4-0.6 % SOLN Place 1 drop into both eyes daily.     potassium chloride  SA (KLOR-CON  M) 20 MEQ tablet TAKE 1 TABLET BY MOUTH DAILY 90 tablet 3   PROAIR  HFA 108 (90 Base) MCG/ACT inhaler Inhale 2 puffs into the lungs every 4 (four) hours as needed for wheezing or shortness of breath. 18 g 3   Respiratory Therapy Supplies (FLUTTER) DEVI Use 3 times a day as needed for congestion. 1 each 0   rosuvastatin  (CRESTOR ) 20 MG tablet Take 1 tablet (20 mg total) by mouth daily. 90 tablet 3   Tiotropium Bromide -Olodaterol (STIOLTO RESPIMAT ) 2.5-2.5 MCG/ACT AERS USE 2 INHALATIONS BY MOUTH DAILY 12 g 9   No facility-administered medications prior to visit.   Review of Systems  Review of Systems  Constitutional: Negative.   HENT: Negative.    Respiratory:  Positive for shortness of breath. Negative for cough, chest tightness and wheezing.        DOE  Cardiovascular: Negative.    Physical Exam  There were no vitals taken for this visit. Physical Exam Constitutional:       General: She is not in acute distress.    Appearance: Normal appearance. She is not ill-appearing.  HENT:     Mouth/Throat:     Mouth: Mucous membranes are moist.     Pharynx: Oropharynx is clear.  Cardiovascular:     Rate and Rhythm: Normal rate and regular rhythm.  Pulmonary:     Effort: Pulmonary effort is normal.     Breath sounds: No wheezing, rhonchi or rales.     Comments: 3L POC Musculoskeletal:        General: Normal range of motion.  Skin:    General: Skin is warm and dry.  Neurological:     General: No focal deficit present.     Mental Status: She is alert and oriented to person, place, and time. Mental status is at baseline.  Psychiatric:        Mood and Affect: Mood normal.        Behavior: Behavior normal.        Thought Content: Thought content normal.        Judgment: Judgment normal.    Lab Results:  CBC    Component Value Date/Time   WBC 6.9 12/24/2021 0937   WBC 10.8 (A) 09/01/2018 1613   WBC 7.7 08/21/2018 0549   RBC 4.88 12/24/2021 0937   RBC 5.40 09/01/2018 1613   RBC 4.77 08/21/2018 0549   HGB 14.6 12/24/2021 0937   HCT 44.3 12/24/2021 0937   PLT 253 12/24/2021 0937   MCV 91 12/24/2021 0937   MCH 29.9 12/24/2021 0937   MCH 30.0 09/01/2018 1613   MCH 29.4 08/21/2018 0549   MCHC 33.0  12/24/2021 0937   MCHC 34.0 09/01/2018 1613   MCHC 31.7 08/21/2018 0549   RDW 13.5 12/24/2021 0937   LYMPHSABS 2.4 12/24/2021 0937   MONOABS 0.7 08/20/2018 1908   EOSABS 0.3 12/24/2021 0937   BASOSABS 0.0 12/24/2021 0937    BMET    Component Value Date/Time   NA 142 12/24/2021 0937   K 3.9 12/24/2021 0937   CL 100 12/24/2021 0937   CO2 30 (H) 12/24/2021 0937   GLUCOSE 86 12/24/2021 0937   GLUCOSE 151 (H) 08/21/2018 0549   BUN 9 12/24/2021 0937   CREATININE 1.03 (H) 12/24/2021 0937   CREATININE 0.88 06/17/2016 0821   CALCIUM  10.2 12/24/2021 0937   GFRNONAA 61 03/20/2020 0905   GFRNONAA 56 (L) 07/26/2014 0951   GFRAA 71 03/20/2020 0905   GFRAA 65  07/26/2014 0951    BNP    Component Value Date/Time   BNP 34.4 08/20/2018 1903    ProBNP    Component Value Date/Time   PROBNP 19.0 11/04/2023 1058    Imaging: DG Chest 1 View Result Date: 09/07/2024 EXAM: 1 VIEW(S) XRAY OF THE CHEST 08/31/2024 11:21:01 AM COMPARISON: CT from 08/01/2024, chest x-ray from 02/18/2021. CLINICAL HISTORY: Left-sided chest pain. FINDINGS: LUNGS AND PLEURA: No focal pulmonary opacity. No pleural effusion. No pneumothorax. HEART AND MEDIASTINUM: No acute abnormality of the cardiac and mediastinal silhouettes. BONES AND SOFT TISSUES: Exuberant calcification is noted at the first costosternal junction, similar to that seen on prior exams, to include a recent CT from 08/01/2024. Multilevel thoracic osteophytosis. No acute fracture. IMPRESSION: 1. No acute findings. Electronically signed by: Oneil Devonshire MD 09/07/2024 08:55 PM EST RP Workstation: [id number]   DG Abd 1 View Result Date: 09/07/2024 EXAM: 1 VIEW XRAY OF THE ABDOMEN 08/31/2024 11:21:01 AM COMPARISON: None available. CLINICAL HISTORY: Right upper quadrant pain. FINDINGS: BOWEL: Nonobstructive bowel gas pattern. SOFT TISSUES: No abnormal calcifications. BONES: Bilateral hip osteoarthritis. Degenerative changes of the lumbar spine are noted. No acute fracture. IMPRESSION: 1. No acute findings. Electronically signed by: Oneil Devonshire MD 09/07/2024 08:53 PM EST RP Workstation: [id number]     Assessment & Plan:   No problem-specific Assessment & Plan notes found for this encounter.   1. COPD GOLD 0  (Primary)  2. Chronic respiratory failure with hypoxia (HCC)   Assessment and Plan Assessment & Plan Chronic obstructive pulmonary disease (COPD) with emphysema COPD with emphysema, primarily due to smoking history. Lung function shows a 2% decrease in FEV1 pre-bronchodilator since June, and a 16% decrease over four years. Symptoms are well-managed with oxygen  and Stiolto inhaler. No significant changes in  respiratory status since June. - Continue oxygen  therapy at 2-3 L as needed. - Continue Stiolto inhaler daily. - Referred to pulmonary rehabilitation.  Chronic respiratory failure with hypoxia Chronic respiratory failure managed with supplemental oxygen . Oxygen  therapy is used at 2 L at rest and 3L during exertion. No significant changes in respiratory status since June. - Continue current oxygen  therapy 2-3L supplemental oxygen  at night and with exertion to maintain O2 >88-90%.   Interstitial lung disease (ILD) with possible Sjogren's disease ILD with mild symptom burden. Possible underlying autoimmune etiology suggested by serology indicating Sjogren's disease. Differential includes idiopathic pulmonary fibrosis versus autoimmune-related fibrosis. Further evaluation by rheumatology is needed to confirm diagnosis and guide treatment. - Referred to rheumatology for evaluation of possible Sjogren's disease. - Sending note to Dr. Geronimo for further input.  Recording duration: 13 minutes   Almarie LELON Ferrari, NP 09/21/2024     [  1]  Allergies Allergen Reactions   Adhesive [Tape] Other (See Comments)    IRRITATES SKIN   [2]  Social History Tobacco Use  Smoking Status Former   Current packs/day: 0.00   Average packs/day: 1 pack/day for 30.0 years (30.0 ttl pk-yrs)   Types: Cigarettes   Start date: 10/24/1983   Quit date: 10/23/2013   Years since quitting: 10.9  Smokeless Tobacco Never   "

## 2024-09-22 ENCOUNTER — Ambulatory Visit (INDEPENDENT_AMBULATORY_CARE_PROVIDER_SITE_OTHER): Payer: PRIVATE HEALTH INSURANCE | Admitting: *Deleted

## 2024-09-22 ENCOUNTER — Ambulatory Visit: Admitting: Primary Care

## 2024-09-22 ENCOUNTER — Encounter: Payer: Self-pay | Admitting: Primary Care

## 2024-09-22 VITALS — BP 132/64 | HR 86 | Temp 98.3°F | Ht 65.0 in | Wt 166.0 lb

## 2024-09-22 DIAGNOSIS — Z87891 Personal history of nicotine dependence: Secondary | ICD-10-CM

## 2024-09-22 DIAGNOSIS — Z122 Encounter for screening for malignant neoplasm of respiratory organs: Secondary | ICD-10-CM

## 2024-09-22 DIAGNOSIS — J849 Interstitial pulmonary disease, unspecified: Secondary | ICD-10-CM

## 2024-09-22 DIAGNOSIS — J9611 Chronic respiratory failure with hypoxia: Secondary | ICD-10-CM

## 2024-09-22 DIAGNOSIS — J449 Chronic obstructive pulmonary disease, unspecified: Secondary | ICD-10-CM

## 2024-09-22 DIAGNOSIS — I2510 Atherosclerotic heart disease of native coronary artery without angina pectoris: Secondary | ICD-10-CM

## 2024-09-22 DIAGNOSIS — J439 Emphysema, unspecified: Secondary | ICD-10-CM | POA: Diagnosis not present

## 2024-09-22 LAB — PULMONARY FUNCTION TEST
DL/VA % pred: 39 %
DL/VA: 1.58 ml/min/mmHg/L
DLCO cor % pred: 28 %
DLCO cor: 6 ml/min/mmHg
DLCO unc % pred: 28 %
DLCO unc: 6 ml/min/mmHg
FEF 25-75 Pre: 0.87 L/s
FEF2575-%Pred-Pre: 49 %
FEV1-%Pred-Pre: 68 %
FEV1-Pre: 1.59 L
FEV1FVC-%Pred-Pre: 89 %
FEV6-%Pred-Pre: 79 %
FEV6-Pre: 2.35 L
FEV6FVC-%Pred-Pre: 104 %
FVC-%Pred-Pre: 76 %
FVC-Pre: 2.37 L
Pre FEV1/FVC ratio: 67 %
Pre FEV6/FVC Ratio: 99 %

## 2024-09-22 MED ORDER — STIOLTO RESPIMAT 2.5-2.5 MCG/ACT IN AERS: 2.0000 | INHALATION_SPRAY | Freq: Every day | RESPIRATORY_TRACT | Status: AC

## 2024-09-22 NOTE — Patient Instructions (Addendum)
" °  VISIT SUMMARY: During your follow-up visit, we reviewed your respiratory conditions, including emphysema, interstitial lung disease, and chronic respiratory failure. Your symptoms have remained stable since your last visit in June, and we discussed your current management plan and any necessary adjustments.  YOUR PLAN: -CHRONIC OBSTRUCTIVE PULMONARY DISEASE (COPD) WITH EMPHYSEMA: COPD with emphysema is a chronic lung condition primarily caused by smoking, leading to difficulty breathing. Your lung function has slightly decreased since June, but your symptoms are well-managed with oxygen  therapy and the Stiolto inhaler. Continue using oxygen  at 2-3 liters as needed and the Stiolto inhaler daily. You are also referred to pulmonary rehabilitation to help improve your lung function and overall health.  -CHRONIC RESPIRATORY FAILURE WITH HYPOXIA: Chronic respiratory failure with hypoxia means your lungs are not getting enough oxygen  into your blood. This condition is managed with supplemental oxygen . Continue your current oxygen  therapy regimen, using 2 liters at rest and 3 liters during exertion.  -INTERSTITIAL LUNG DISEASE (ILD) WITH CONCERN FOR UNDERLYING AUTOIMMUNE ETIOLOGY (POSSIBLE SJOGREN'S DISEASE): Interstitial lung disease is a group of lung disorders causing scarring of lung tissue. There is a concern that your ILD may be related to an autoimmune condition like Sjogren's disease. You are referred to rheumatology for further evaluation to confirm the diagnosis and guide treatment. A note has been sent to Dr. Geronimo for further input.  INSTRUCTIONS: Please follow up with rheumatology for the evaluation of possible Sjogren's disease. Continue your current oxygen  therapy and medication regimen. Attend pulmonary rehabilitation as referred.  Referral Pulmonary rehab  Rheumatology re: rule our Sjogren's syndrome   Orders Pulmonary function testing in 6 months   Follow-up 6 months with Dr.  Geronimo ILD slot - 30 min PFT prior   Contains text generated by [employer].   "

## 2024-09-22 NOTE — Progress Notes (Signed)
 Spirometry and diffusion capacity performed today.

## 2024-09-22 NOTE — Addendum Note (Signed)
 Addended by: CLAUDENE NEVINS A on: 09/22/2024 03:40 PM   Modules accepted: Orders

## 2024-09-22 NOTE — Patient Instructions (Signed)
 Spirometry and diffusion capacity performed today.

## 2024-09-23 ENCOUNTER — Telehealth (HOSPITAL_COMMUNITY): Payer: Self-pay

## 2024-09-23 NOTE — Telephone Encounter (Signed)
 Called pt, she is interested in [facility]. Would like her insurance run before making an appointment. Will pass along to support team to verify insurance/benefits.

## 2024-09-29 ENCOUNTER — Encounter (HOSPITAL_COMMUNITY): Payer: Self-pay

## 2024-09-29 ENCOUNTER — Telehealth (HOSPITAL_COMMUNITY): Payer: Self-pay

## 2024-09-29 NOTE — Telephone Encounter (Signed)
 Pt insurance is active and benefits verified through Medicare a/b Co-pay 0, DED $283/$283 met, out of pocket 0/0 met, co-insurance 20%. no pre-authorization required.   2ndary insurance is active and benefits verified through [employer]. Co-pay 0, DED 0/0 met, out of pocket 0/0 met, co-insurance 0%. No pre-authorization required.

## 2024-09-29 NOTE — Telephone Encounter (Signed)
 Called patient to see if she was interested in participating in the Pulmonary Rehab Program. Patient will come in for orientation on 10/03/24@9am  and will attend the 8:15am exercise class.   Sent package.

## 2024-10-03 ENCOUNTER — Encounter (HOSPITAL_COMMUNITY)

## 2024-10-11 ENCOUNTER — Encounter (HOSPITAL_COMMUNITY)

## 2024-10-13 ENCOUNTER — Encounter (HOSPITAL_COMMUNITY)

## 2024-10-18 ENCOUNTER — Encounter (HOSPITAL_COMMUNITY)

## 2024-10-20 ENCOUNTER — Encounter (HOSPITAL_COMMUNITY)

## 2024-10-25 ENCOUNTER — Encounter (HOSPITAL_COMMUNITY)

## 2024-10-27 ENCOUNTER — Encounter (HOSPITAL_COMMUNITY)

## 2024-11-01 ENCOUNTER — Encounter (HOSPITAL_COMMUNITY)

## 2024-11-03 ENCOUNTER — Encounter (HOSPITAL_COMMUNITY)

## 2024-11-08 ENCOUNTER — Encounter (HOSPITAL_COMMUNITY)

## 2024-11-10 ENCOUNTER — Encounter (HOSPITAL_COMMUNITY)

## 2024-11-15 ENCOUNTER — Encounter (HOSPITAL_COMMUNITY)

## 2024-11-17 ENCOUNTER — Encounter (HOSPITAL_COMMUNITY)

## 2024-11-22 ENCOUNTER — Encounter (HOSPITAL_COMMUNITY)

## 2024-11-24 ENCOUNTER — Encounter (HOSPITAL_COMMUNITY)

## 2024-11-29 ENCOUNTER — Encounter (HOSPITAL_COMMUNITY)

## 2024-12-01 ENCOUNTER — Encounter (HOSPITAL_COMMUNITY)

## 2024-12-06 ENCOUNTER — Encounter (HOSPITAL_COMMUNITY)

## 2024-12-08 ENCOUNTER — Encounter (HOSPITAL_COMMUNITY)

## 2024-12-13 ENCOUNTER — Encounter (HOSPITAL_COMMUNITY)

## 2024-12-15 ENCOUNTER — Ambulatory Visit: Admitting: Rheumatology

## 2024-12-20 ENCOUNTER — Encounter (HOSPITAL_COMMUNITY)

## 2024-12-22 ENCOUNTER — Encounter (HOSPITAL_COMMUNITY)

## 2024-12-27 ENCOUNTER — Encounter (HOSPITAL_COMMUNITY)

## 2024-12-29 ENCOUNTER — Encounter (HOSPITAL_COMMUNITY)

## 2025-01-25 ENCOUNTER — Ambulatory Visit: Admitting: Rheumatology
